# Patient Record
Sex: Female | Born: 1945 | Race: White | Hispanic: No | Marital: Married | Smoking: Never smoker
Health system: Southern US, Community
[De-identification: ages and names within clinical notes are randomized; demographics above are authoritative.]

## PROBLEM LIST (undated history)

## (undated) DIAGNOSIS — IMO0001 Reserved for inherently not codable concepts without codable children: Secondary | ICD-10-CM

## (undated) DIAGNOSIS — E039 Hypothyroidism, unspecified: Secondary | ICD-10-CM

## (undated) DIAGNOSIS — N952 Postmenopausal atrophic vaginitis: Secondary | ICD-10-CM

## (undated) DIAGNOSIS — C4491 Basal cell carcinoma of skin, unspecified: Secondary | ICD-10-CM

## (undated) DIAGNOSIS — T7840XA Allergy, unspecified, initial encounter: Secondary | ICD-10-CM

## (undated) DIAGNOSIS — C3490 Malignant neoplasm of unspecified part of unspecified bronchus or lung: Secondary | ICD-10-CM

## (undated) DIAGNOSIS — M8580 Other specified disorders of bone density and structure, unspecified site: Secondary | ICD-10-CM

## (undated) DIAGNOSIS — E785 Hyperlipidemia, unspecified: Secondary | ICD-10-CM

## (undated) DIAGNOSIS — H8109 Meniere's disease, unspecified ear: Secondary | ICD-10-CM

## (undated) DIAGNOSIS — Z5111 Encounter for antineoplastic chemotherapy: Secondary | ICD-10-CM

## (undated) DIAGNOSIS — C3411 Malignant neoplasm of upper lobe, right bronchus or lung: Secondary | ICD-10-CM

## (undated) DIAGNOSIS — R42 Dizziness and giddiness: Secondary | ICD-10-CM

## (undated) DIAGNOSIS — C801 Malignant (primary) neoplasm, unspecified: Secondary | ICD-10-CM

## (undated) HISTORY — DX: Allergy, unspecified, initial encounter: T78.40XA

## (undated) HISTORY — DX: Malignant neoplasm of unspecified part of unspecified bronchus or lung: C34.90

## (undated) HISTORY — DX: Malignant neoplasm of upper lobe, right bronchus or lung: C34.11

## (undated) HISTORY — DX: Other specified disorders of bone density and structure, unspecified site: M85.80

## (undated) HISTORY — DX: Hyperlipidemia, unspecified: E78.5

## (undated) HISTORY — DX: Encounter for antineoplastic chemotherapy: Z51.11

## (undated) HISTORY — DX: Dizziness and giddiness: R42

## (undated) HISTORY — DX: Hypothyroidism, unspecified: E03.9

## (undated) HISTORY — DX: Basal cell carcinoma of skin, unspecified: C44.91

## (undated) HISTORY — DX: Postmenopausal atrophic vaginitis: N95.2

## (undated) HISTORY — DX: Malignant (primary) neoplasm, unspecified: C80.1

---
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
## 1978-12-18 HISTORY — PX: VAGINAL HYSTERECTOMY: SUR661

## 1996-12-18 HISTORY — PX: BREAST LUMPECTOMY: SHX2

## 1997-12-18 DIAGNOSIS — C801 Malignant (primary) neoplasm, unspecified: Secondary | ICD-10-CM

## 1997-12-18 HISTORY — DX: Malignant (primary) neoplasm, unspecified: C80.1

## 1997-12-18 HISTORY — PX: BREAST BIOPSY: SHX20

## 2000-03-20 ENCOUNTER — Other Ambulatory Visit: Admission: RE | Admit: 2000-03-20 | Discharge: 2000-03-20 | Payer: Self-pay | Admitting: *Deleted

## 2000-09-05 ENCOUNTER — Emergency Department (HOSPITAL_COMMUNITY): Admission: EM | Admit: 2000-09-05 | Discharge: 2000-09-05 | Payer: Self-pay | Admitting: Emergency Medicine

## 2003-10-08 ENCOUNTER — Other Ambulatory Visit: Admission: RE | Admit: 2003-10-08 | Discharge: 2003-10-08 | Payer: Self-pay | Admitting: Gynecology

## 2005-09-21 ENCOUNTER — Other Ambulatory Visit: Admission: RE | Admit: 2005-09-21 | Discharge: 2005-09-21 | Payer: Self-pay | Admitting: Gynecology

## 2007-07-25 ENCOUNTER — Other Ambulatory Visit: Admission: RE | Admit: 2007-07-25 | Discharge: 2007-07-25 | Payer: Self-pay | Admitting: Gynecology

## 2007-10-01 ENCOUNTER — Emergency Department (HOSPITAL_COMMUNITY): Admission: EM | Admit: 2007-10-01 | Discharge: 2007-10-01 | Payer: Self-pay | Admitting: Emergency Medicine

## 2008-08-13 ENCOUNTER — Other Ambulatory Visit: Admission: RE | Admit: 2008-08-13 | Discharge: 2008-08-13 | Payer: Self-pay | Admitting: Gynecology

## 2008-12-18 DIAGNOSIS — C4491 Basal cell carcinoma of skin, unspecified: Secondary | ICD-10-CM

## 2008-12-18 HISTORY — DX: Basal cell carcinoma of skin, unspecified: C44.91

## 2009-09-06 ENCOUNTER — Ambulatory Visit: Payer: Self-pay | Admitting: Women's Health

## 2009-09-06 ENCOUNTER — Encounter: Payer: Self-pay | Admitting: Women's Health

## 2009-09-06 ENCOUNTER — Other Ambulatory Visit: Admission: RE | Admit: 2009-09-06 | Discharge: 2009-09-06 | Payer: Self-pay | Admitting: Gynecology

## 2010-08-18 LAB — HM MAMMOGRAPHY: HM Mammogram: NORMAL

## 2010-10-03 ENCOUNTER — Ambulatory Visit: Payer: Self-pay | Admitting: Women's Health

## 2011-04-06 ENCOUNTER — Ambulatory Visit (INDEPENDENT_AMBULATORY_CARE_PROVIDER_SITE_OTHER): Payer: Self-pay | Admitting: Women's Health

## 2011-04-06 ENCOUNTER — Other Ambulatory Visit (HOSPITAL_COMMUNITY)
Admission: RE | Admit: 2011-04-06 | Discharge: 2011-04-06 | Disposition: A | Discharge status: Home / Self Care | Payer: Self-pay | Source: Ambulatory Visit | Attending: Gynecology | Admitting: Gynecology

## 2011-04-06 ENCOUNTER — Other Ambulatory Visit: Payer: Self-pay | Admitting: Women's Health

## 2011-04-06 DIAGNOSIS — R8789 Other abnormal findings in specimens from female genital organs: Secondary | ICD-10-CM

## 2011-04-06 DIAGNOSIS — R87619 Unspecified abnormal cytological findings in specimens from cervix uteri: Secondary | ICD-10-CM | POA: Insufficient documentation

## 2011-04-18 LAB — HM PAP SMEAR: HM Pap smear: NORMAL

## 2011-08-04 ENCOUNTER — Ambulatory Visit (INDEPENDENT_AMBULATORY_CARE_PROVIDER_SITE_OTHER)
Admission: RE | Admit: 2011-08-04 | Discharge: 2011-08-04 | Disposition: A | Discharge status: Home / Self Care | Payer: Medicare Other | Source: Ambulatory Visit | Attending: Internal Medicine | Admitting: Internal Medicine

## 2011-08-04 ENCOUNTER — Other Ambulatory Visit (INDEPENDENT_AMBULATORY_CARE_PROVIDER_SITE_OTHER): Payer: Medicare Other

## 2011-08-04 ENCOUNTER — Encounter: Payer: Self-pay | Admitting: Internal Medicine

## 2011-08-04 ENCOUNTER — Other Ambulatory Visit: Payer: Self-pay | Admitting: Internal Medicine

## 2011-08-04 ENCOUNTER — Ambulatory Visit (INDEPENDENT_AMBULATORY_CARE_PROVIDER_SITE_OTHER): Payer: Medicare Other | Admitting: Internal Medicine

## 2011-08-04 VITALS — BP 104/72 | HR 83 | Temp 98.8°F | Ht 69.5 in | Wt 159.8 lb

## 2011-08-04 DIAGNOSIS — E785 Hyperlipidemia, unspecified: Secondary | ICD-10-CM

## 2011-08-04 DIAGNOSIS — M79606 Pain in leg, unspecified: Secondary | ICD-10-CM

## 2011-08-04 DIAGNOSIS — K5790 Diverticulosis of intestine, part unspecified, without perforation or abscess without bleeding: Secondary | ICD-10-CM | POA: Insufficient documentation

## 2011-08-04 DIAGNOSIS — M79609 Pain in unspecified limb: Secondary | ICD-10-CM

## 2011-08-04 DIAGNOSIS — Z0000 Encounter for general adult medical examination without abnormal findings: Secondary | ICD-10-CM

## 2011-08-04 DIAGNOSIS — E039 Hypothyroidism, unspecified: Secondary | ICD-10-CM | POA: Insufficient documentation

## 2011-08-04 DIAGNOSIS — J309 Allergic rhinitis, unspecified: Secondary | ICD-10-CM | POA: Insufficient documentation

## 2011-08-04 HISTORY — DX: Hypothyroidism, unspecified: E03.9

## 2011-08-04 HISTORY — DX: Hyperlipidemia, unspecified: E78.5

## 2011-08-04 LAB — CBC WITH DIFFERENTIAL/PLATELET
Basophils Absolute: 0.1 10*3/uL (ref 0.0–0.1)
Eosinophils Relative: 5.5 % — ABNORMAL HIGH (ref 0.0–5.0)
HCT: 38.8 % (ref 36.0–46.0)
Hemoglobin: 13.2 g/dL (ref 12.0–15.0)
Lymphocytes Relative: 37.4 % (ref 12.0–46.0)
Lymphs Abs: 2.9 10*3/uL (ref 0.7–4.0)
Monocytes Relative: 10.7 % (ref 3.0–12.0)
Neutro Abs: 3.5 10*3/uL (ref 1.4–7.7)
RBC: 4.32 Mil/uL (ref 3.87–5.11)
RDW: 12.3 % (ref 11.5–14.6)
WBC: 7.8 10*3/uL (ref 4.5–10.5)

## 2011-08-04 LAB — URINALYSIS, ROUTINE W REFLEX MICROSCOPIC
Leukocytes, UA: NEGATIVE
Specific Gravity, Urine: >=1.03 (ref 1.000–1.030)
Urine Glucose: NEGATIVE
Urobilinogen, UA: 0.2 (ref 0.0–1.0)
pH: 6 (ref 5.0–8.0)

## 2011-08-04 LAB — LIPID PANEL
Cholesterol: 196 mg/dL (ref 0–200)
HDL: 64.6 mg/dL (ref >39.00)
Triglycerides: 88 mg/dL (ref 0.0–149.0)
VLDL: 17.6 mg/dL (ref 0.0–40.0)

## 2011-08-04 LAB — HEPATIC FUNCTION PANEL
Albumin: 4.4 g/dL (ref 3.5–5.2)
Alkaline Phosphatase: 92 U/L (ref 39–117)
Total Protein: 6.6 g/dL (ref 6.0–8.3)

## 2011-08-04 LAB — BASIC METABOLIC PANEL
BUN: 21 mg/dL (ref 6–23)
CO2: 28 mEq/L (ref 19–32)
Calcium: 9.3 mg/dL (ref 8.4–10.5)
Creatinine, Ser: 0.8 mg/dL (ref 0.4–1.2)
GFR: 79.92 mL/min (ref >60.00)
Glucose, Bld: 90 mg/dL (ref 70–99)
Sodium: 142 mEq/L (ref 135–145)

## 2011-08-04 MED ORDER — PNEUMOCOCCAL VAC POLYVALENT 25 MCG/0.5ML IJ INJ: 0.5000 mL | INJECTION | Freq: Once | INTRAMUSCULAR | Status: DC

## 2011-08-04 NOTE — Patient Instructions (Addendum)
You had the pneumonia shot today You will be contacted regarding the referral for: colonoscopy Please remember to followup with your GYN for the yearly pap smear and/or mammogram, as you do Please go to LAB in the Basement for the blood and/or urine tests to be done today Please go to XRAY in the Basement for the x-ray test (for the right leg) Please call the phone number [phone] (the [employer] System) for results of testing in 2-3 days;  When calling, simply dial the number, and when prompted enter the MRN number above (the Medical Record Number) and the # key, then the message should start. Please return in 1 year for your yearly visit, or sooner if needed, with Lab testing done 3-5 days before

## 2011-08-04 NOTE — Assessment & Plan Note (Signed)
Overall doing well, age appropriate education and counseling updated, referrals for preventative services and immunizations addressed, dietary and smoking counseling addressed, most recent labs and ECG reviewed.  I have personally reviewed and have noted: 1) the patient's medical and social history 2) The pt's use of alcohol, tobacco, and illicit drugs 3) The patient's current medications and supplements 4) Functional ability including ADL's, fall risk, home safety risk, hearing and visual impairment 5) Diet and physical activities 6) Evidence for depression or mood disorder 7) The patient's height, weight, and BMI have been recorded in the chart I have made referrals, and provided counseling and education based on review of the above For colonoscopy, and pneumovax

## 2011-08-04 NOTE — Assessment & Plan Note (Addendum)
Recurrent persistent for one year after fall with marked knee strain, with knee pain/strain now improved but leg pain pretibial some worse in the past 2 mo;  For film today, and SPEP but suspect recurrent msk or neuritic pain, exam benign, ok to cont the alleve prn

## 2011-08-04 NOTE — Progress Notes (Signed)
Subjective:    Patient ID: Deborah [name], female    DOB: [birth year], 66 y.o.   MRN: [id number]  HPI Here as new pt; Here for wellness;  Overall doing ok;  Pt denies CP, worsening SOB, DOE, wheezing, orthopnea, PND, worsening LE edema, palpitations, dizziness or syncope.  Pt denies neurological change such as new Headache, facial or extremity weakness.  Pt denies polydipsia, polyuria, or low sugar symptoms. Pt states overall good compliance with treatment and medications, good tolerability, and trying to follow lower cholesterol diet.  Pt denies worsening depressive symptoms, suicidal ideation or panic. No fever, wt loss, night sweats, loss of appetite, or other constitutional symptoms.  Pt states good ability with ADL's, low fall risk, home safety reviewed and adequate, no significant changes in hearing or vision, and occasionally active with exercise.  Has had ongoing discomfort for approx 1 yr to the right mid pretibial area without skin change, swelling, or recent trauma except for an episode of fall where the right knee was strained just prior to onset of pain, but pain some worse in the past 2 mo.  Denies hyper or hypo thyroid symptoms such as voice, skin or hair change. Past Medical History  Diagnosis Date  . Hypothyroidism 08/04/2011  . Allergy   . Jaundice    Past Surgical History  Procedure Date  . Abdominal hysterectomy 1980  . Tonsillectomy 1952  . Breast biopsy 1999    reports that she has never smoked. She does not have any smokeless tobacco history on file. She reports that she drinks alcohol. She reports that she does not use illicit drugs. family history includes Cancer in her mother; Dementia in her mother; Diabetes in her other; and Heart disease in her father and other. Allergies not on file  Review of Systems Review of Systems  Constitutional: Negative for diaphoresis, activity change, appetite change and unexpected weight change.  HENT: Negative for hearing loss, ear  pain, facial swelling, mouth sores and neck stiffness.   Eyes: Negative for pain, redness and visual disturbance.  Respiratory: Negative for shortness of breath and wheezing.   Cardiovascular: Negative for chest pain and palpitations.  Gastrointestinal: Negative for diarrhea, blood in stool, abdominal distention and rectal pain.  Genitourinary: Negative for hematuria, flank pain and decreased urine volume.  Musculoskeletal: Negative for myalgias and joint swelling.  Skin: Negative for color change and wound.  Neurological: Negative for syncope and numbness.  Hematological: Negative for adenopathy.  Psychiatric/Behavioral: Negative for hallucinations, self-injury, decreased concentration and agitation.     Objective:   Physical Exam BP 104/72  Pulse 83  Temp(Src) 98.8 F (37.1 C) (Oral)  Ht 5' 9.5" (1.765 m)  Wt 159 lb 12.8 oz (72.485 kg)  BMI 23.26 kg/m2  SpO2 95% Physical Exam  VS noted Constitutional: Pt is oriented to person, place, and time. Appears well-developed and well-nourished.  HENT:  Head: Normocephalic and atraumatic.  Right Ear: External ear normal.  Left Ear: External ear normal.  Nose: Nose normal.  Mouth/Throat: Oropharynx is clear and moist.  Eyes: Conjunctivae and EOM are normal. Pupils are equal, round, and reactive to light.  Neck: Normal range of motion. Neck supple. No JVD present. No tracheal deviation present.  Cardiovascular: Normal rate, regular rhythm, normal heart sounds and intact distal pulses.   Pulmonary/Chest: Effort normal and breath sounds normal.  Abdominal: Soft. Bowel sounds are normal. There is no tenderness.  Musculoskeletal: Normal range of motion. Exhibits no edema.  Lymphadenopathy:  Has no cervical adenopathy.  Neurological: Pt is alert and oriented to person, place, and time. Pt has normal reflexes. No cranial nerve deficit.  Skin: Skin is warm and dry. No rash noted.  Psychiatric:  Has  normal mood and affect. Behavior is normal.    RLE nontender, nonswollen, neurovasc intact below the knee       Assessment & Plan:

## 2011-08-08 LAB — PROTEIN ELECTROPHORESIS, SERUM
Alpha-1-Globulin: 4.3 % (ref 2.9–4.9)
Gamma Globulin: 9.7 % — ABNORMAL LOW (ref 11.1–18.8)
Total Protein, Serum Electrophoresis: 6.5 g/dL (ref 6.0–8.3)

## 2011-08-10 ENCOUNTER — Other Ambulatory Visit: Payer: Self-pay | Admitting: Internal Medicine

## 2011-08-10 ENCOUNTER — Ambulatory Visit
Admission: RE | Admit: 2011-08-10 | Discharge: 2011-08-10 | Disposition: A | Discharge status: Home / Self Care | Payer: Medicare Other | Source: Ambulatory Visit | Attending: Internal Medicine | Admitting: Internal Medicine

## 2011-08-10 ENCOUNTER — Encounter: Payer: Self-pay | Admitting: Internal Medicine

## 2011-08-10 DIAGNOSIS — M79606 Pain in leg, unspecified: Secondary | ICD-10-CM

## 2011-08-15 ENCOUNTER — Telehealth: Payer: Self-pay

## 2011-08-15 NOTE — Telephone Encounter (Signed)
Pt advised.

## 2011-08-15 NOTE — Telephone Encounter (Signed)
Pt called requesting results of MRI 

## 2011-08-15 NOTE — Telephone Encounter (Signed)
Should be on [place] as I remember doing the message the same date it was done;  MRI shows benign appearing lesions only to the bone;  No need for further eval or tx

## 2011-09-06 ENCOUNTER — Ambulatory Visit (AMBULATORY_SURGERY_CENTER): Payer: Medicare Other | Admitting: *Deleted

## 2011-09-06 ENCOUNTER — Encounter: Payer: Self-pay | Admitting: Internal Medicine

## 2011-09-06 VITALS — Ht 69.0 in | Wt 160.0 lb

## 2011-09-06 DIAGNOSIS — Z1211 Encounter for screening for malignant neoplasm of colon: Secondary | ICD-10-CM

## 2011-09-06 MED ORDER — PEG-KCL-NACL-NASULF-NA ASC-C 100 G PO SOLR: ORAL | Status: DC

## 2011-09-20 ENCOUNTER — Ambulatory Visit (AMBULATORY_SURGERY_CENTER): Payer: Medicare Other | Admitting: Internal Medicine

## 2011-09-20 ENCOUNTER — Encounter: Payer: Self-pay | Admitting: Internal Medicine

## 2011-09-20 VITALS — BP 132/85 | HR 80 | Temp 97.6°F | Resp 16 | Ht 69.0 in | Wt 160.0 lb

## 2011-09-20 DIAGNOSIS — D129 Benign neoplasm of anus and anal canal: Secondary | ICD-10-CM

## 2011-09-20 DIAGNOSIS — Z1211 Encounter for screening for malignant neoplasm of colon: Secondary | ICD-10-CM

## 2011-09-20 DIAGNOSIS — D128 Benign neoplasm of rectum: Secondary | ICD-10-CM

## 2011-09-20 DIAGNOSIS — D126 Benign neoplasm of colon, unspecified: Secondary | ICD-10-CM

## 2011-09-20 DIAGNOSIS — K635 Polyp of colon: Secondary | ICD-10-CM

## 2011-09-20 MED ORDER — SODIUM CHLORIDE 0.9 % IV SOLN: 500.0000 mL | INTRAVENOUS | Status: DC

## 2011-09-20 NOTE — Patient Instructions (Signed)
Please refer to your blue and neon green sheets for instructions regarding diet and activity for the rest of today.  You may resume your medications as you would normally take them.  You will receive a letter in the mail in about two weeks regarding the results of the biopsies taken today.  Polyps, Colon  A polyp is extra tissue that grows inside your body. Colon polyps grow in the large intestine. The large intestine, also called the colon, is part of your digestive system. It is a long, hollow tube at the end of your digestive tract where your body makes and stores stool. Most polyps are not dangerous. They are benign. This means they are not cancerous. But over time, some types of polyps can turn into cancer. Polyps that are smaller than a pea are usually not harmful. But larger polyps could someday become or may already be cancerous. To be safe, doctors remove all polyps and test them.  WHO GETS POLYPS? Anyone can get polyps, but certain people are more likely than others. You may have a greater chance of getting polyps if:  You are over 50.   You have had polyps before.   Someone in your family has had polyps.   Someone in your family has had cancer of the large intestine.   Find out if someone in your family has had polyps. You may also be more likely to get polyps if you:   Eat a lot of fatty foods   Smoke   Drink alcohol   Do not exercise  Eat too much  SYMPTOMS Most small polyps do not cause symptoms. People often do not know they have one until their caregiver finds it during a regular checkup or while testing them for something else. Some people do have symptoms like these:  Bleeding from the anus. You might notice blood on your underwear or on toilet paper after you have had a bowel movement.   Constipation or diarrhea that lasts more than a week.   Blood in the stool. Blood can make stool look black or it can show up as red streaks in the stool.  If you have any of  these symptoms, see your caregiver. HOW DOES THE DOCTOR TEST FOR POLYPS? The doctor can use four tests to check for polyps:  Digital rectal exam. The caregiver wears gloves and checks your rectum (the last part of the large intestine) to see if it feels normal. This test would find polyps only in the rectum. Your caregiver may need to do one of the other tests listed below to find polyps higher up in the intestine.   Barium enema. The caregiver puts a liquid called barium into your rectum before taking x-rays of your large intestine. Barium makes your intestine look white in the pictures. Polyps are dark, so they are easy to see.   Sigmoidoscopy. With this test, the caregiver can see inside your large intestine. A thin flexible tube is placed into your rectum. The device is called a sigmoidoscope, which has a light and a tiny video camera in it. The caregiver uses the sigmoidoscope to look at the last third of your large intestine.   Colonoscopy. This test is like sigmoidoscopy, but the caregiver looks at all of the large intestine. It usually requires sedation. This is the most common method for finding and removing polyps.  TREATMENT  The caregiver will remove the polyp during sigmoidoscopy or colonoscopy. The polyp is then tested for cancer.     If you have had polyps, your caregiver may want you to get tested regularly in the future.  PREVENTION There is not one sure way to prevent polyps. You might be able to lower your risk of getting them if you:  Eat more fruits and vegetables and less fatty food.   Do not smoke.   Avoid alcohol.   Exercise every day.   Lose weight if you are overweight.   Eating more calcium and folate can also lower your risk of getting polyps. Some foods that are rich in calcium are milk, cheese, and broccoli. Some foods that are rich in folate are chickpeas, kidney beans, and spinach.   Aspirin might help prevent polyps. Studies are under way.  Document  Released: 08/30/2004 Document Re-Released: 05/24/2010 Exit[employer] Patient Information 2011 [employer].  Diverticulosis Diverticulosis is a common condition that develops when small pouches (diverticula) form in the wall of the colon. The risk of diverticulosis increases with age. It happens more often in people who eat a low-fiber diet. Most individuals with diverticulosis have no symptoms. Those individuals with symptoms usually experience belly (abdominal) pain, constipation, or loose stools (diarrhea). HOME CARE INSTRUCTIONS  Increase the amount of fiber in your diet as directed by your caregiver or dietician. This may reduce symptoms of diverticulosis.   Your caregiver may recommend taking a dietary fiber supplement.   Drink at least 6 to 8 glasses of water each day to prevent constipation.   Try not to strain when you have a bowel movement.   Your caregiver may recommend avoiding nuts and seeds to prevent complications, although this is still an uncertain benefit.   Only take over-the-counter or prescription medicines for pain, discomfort, or fever as directed by your caregiver.  FOODS HAVING HIGH FIBER CONTENT INCLUDE:  Fruits. Apple, peach, pear, tangerine, raisins, prunes.   Vegetables. Brussels sprouts, asparagus, broccoli, cabbage, carrot, cauliflower, romaine lettuce, spinach, summer squash, tomato, winter squash, zucchini.   Starchy Vegetables. Baked beans, kidney beans, lima beans, split peas, lentils, potatoes (with skin).   Grains. Whole wheat bread, brown rice, bran flake cereal, plain oatmeal, white rice, shredded wheat, bran muffins.  SEEK IMMEDIATE MEDICAL CARE IF:  You develop increasing pain or severe bloating.   You have an increased oral temperature, not controlled by medicine.   You develop vomiting or bowel movements that are bloody or black.  Document Released: 08/31/2004 Document Re-Released: 05/24/2010 [employer] Patient Information 2011 [employer]. 

## 2011-09-21 ENCOUNTER — Telehealth: Payer: Self-pay | Admitting: *Deleted

## 2011-09-21 NOTE — Telephone Encounter (Signed)

## 2011-09-25 ENCOUNTER — Encounter: Payer: Self-pay | Admitting: Internal Medicine

## 2011-10-04 ENCOUNTER — Encounter: Payer: Self-pay | Admitting: Women's Health

## 2011-10-06 ENCOUNTER — Encounter: Payer: Self-pay | Admitting: Internal Medicine

## 2011-10-06 DIAGNOSIS — C801 Malignant (primary) neoplasm, unspecified: Secondary | ICD-10-CM | POA: Insufficient documentation

## 2011-10-06 DIAGNOSIS — N952 Postmenopausal atrophic vaginitis: Secondary | ICD-10-CM | POA: Insufficient documentation

## 2011-10-09 ENCOUNTER — Encounter: Payer: Medicare Other | Admitting: Women's Health

## 2011-10-11 ENCOUNTER — Other Ambulatory Visit (HOSPITAL_COMMUNITY)
Admission: RE | Admit: 2011-10-11 | Discharge: 2011-10-11 | Disposition: A | Discharge status: Home / Self Care | Payer: Medicare Other | Source: Ambulatory Visit | Attending: Women's Health | Admitting: Women's Health

## 2011-10-11 ENCOUNTER — Ambulatory Visit (INDEPENDENT_AMBULATORY_CARE_PROVIDER_SITE_OTHER): Payer: Medicare Other | Admitting: Women's Health

## 2011-10-11 ENCOUNTER — Encounter: Payer: Self-pay | Admitting: Women's Health

## 2011-10-11 VITALS — BP 130/80 | Ht 69.25 in | Wt 161.0 lb

## 2011-10-11 DIAGNOSIS — Z780 Asymptomatic menopausal state: Secondary | ICD-10-CM

## 2011-10-11 DIAGNOSIS — Z01419 Encounter for gynecological examination (general) (routine) without abnormal findings: Secondary | ICD-10-CM

## 2011-10-11 DIAGNOSIS — E039 Hypothyroidism, unspecified: Secondary | ICD-10-CM

## 2011-10-11 DIAGNOSIS — E079 Disorder of thyroid, unspecified: Secondary | ICD-10-CM

## 2011-10-11 MED ORDER — LEVOTHYROXINE SODIUM 75 MCG PO TABS: 75.0000 ug | ORAL_TABLET | Freq: Every day | ORAL | Status: DC

## 2011-10-11 NOTE — Progress Notes (Signed)
Deborah [name] 1946-11-15 [id number]    History:    The patient presents for pap and vaginal dryness.   Past medical history, past surgical history, family history and social history were all reviewed and documented in the EPIC chart.   ROS:  A  ROS was performed and pertinent positives and negatives are included in the history.  Exam:  Filed Vitals:   10/11/11 0959  BP: 130/80    General appearance:  Normal Head/Neck:  Normal, without cervical or supraclavicular adenopathy. Thyroid:  Symmetrical, normal in size, without palpable masses or nodularity. Respiratory  Effort:  Normal  Auscultation:  Clear without wheezing or rhonchi Cardiovascular  Auscultation:  Regular rate, without rubs, murmurs or gallops  Edema/varicosities:  Not grossly evident Abdominal  Soft,nontender, without masses, guarding or rebound.  Liver/spleen:  No organomegaly noted  Hernia:  None appreciated  Skin  Inspection:  Grossly normal  Palpation:  Grossly normal Neurologic/psychiatric  Orientation:  Normal with appropriate conversation.  Mood/affect:  Normal  Genitourinary    Breasts: Examined lying and sitting.     Right: Without masses, retractions, discharge or axillary adenopathy.     Left: Without masses, retractions, discharge or axillary adenopathy.   Inguinal/mons:  Normal without inguinal adenopathy  External genitalia:  Normal  BUS/Urethra/Skene's glands:  Normal  Bladder:  Normal  Vagina:  Normal/atrophic  Cervix: absent   Uterus:    Adnexa/parametria:     Rt: Without masses or tenderness.   Lt: Without masses or tenderness.  Anus and perineum: Normal  Digital rectal exam: Normal sphincter tone without palpated masses or tenderness  Assessment/Plan:  65 y.o. MWF G3P3 for pap and breast  exam.  Had a colonoscopy last month on polyp that was negative. Significant history of right breast cancer in 1999 with lumpectomy and radiation, normal mammograms after. Has had the  Pneumovax vaccine, encouraged to have  zostovac vaccine at primary care. Hypothyroid on 75 mcg Synthroid. History of a TVH for menorrhagia in 82.  Postmenopausal/ no ERT/ history of breast cancer in 99 Vaginal dryness Hypothyroid  Plan: TSH and Pap, Synthroid 75 mcg by mouth daily prescription proper use reviewed. States has her other labs at her primary care and will start having her TSH done they're as well. SBEs encouraged continue annual mammogram screening. Vitamin D 2000 daily encouraged, DEXA will schedule here has never had one. Encouraged vaginal lubricants with intercourse. Encourage daily exercise, is helping to raise her 59 year old twin grandchildren.    Harrington Challenger [facility], 12:52 PM 10/11/2011

## 2011-10-11 NOTE — Patient Instructions (Signed)
zostavac vaccine/shingles vaccine

## 2011-12-14 ENCOUNTER — Telehealth: Payer: Self-pay

## 2011-12-14 NOTE — Telephone Encounter (Signed)
Patient called today concerning a persistent cough and requested rx sent in. Informed the patient that JWJ has not seen her since August and would need to schedule OV. She agreed to do so.

## 2011-12-15 ENCOUNTER — Encounter: Payer: Self-pay | Admitting: Internal Medicine

## 2011-12-15 ENCOUNTER — Ambulatory Visit (INDEPENDENT_AMBULATORY_CARE_PROVIDER_SITE_OTHER): Payer: Medicare Other | Admitting: Internal Medicine

## 2011-12-15 VITALS — BP 118/76 | HR 87 | Temp 97.9°F | Ht 69.5 in | Wt 163.2 lb

## 2011-12-15 DIAGNOSIS — E785 Hyperlipidemia, unspecified: Secondary | ICD-10-CM

## 2011-12-15 DIAGNOSIS — J309 Allergic rhinitis, unspecified: Secondary | ICD-10-CM

## 2011-12-15 DIAGNOSIS — J209 Acute bronchitis, unspecified: Secondary | ICD-10-CM | POA: Insufficient documentation

## 2011-12-15 MED ORDER — HYDROCODONE-HOMATROPINE 5-1.5 MG/5ML PO SYRP: 5.0000 mL | ORAL_SOLUTION | Freq: Four times a day (QID) | ORAL | Status: AC | PRN

## 2011-12-15 MED ORDER — AZITHROMYCIN 250 MG PO TABS: ORAL_TABLET | ORAL | Status: AC

## 2011-12-15 NOTE — Patient Instructions (Signed)
Take all new medications as prescribed - the antibiotic (sent to the pharmacy), and the cough medicine (given hardcopy) Continue all other medications as before

## 2011-12-17 ENCOUNTER — Encounter: Payer: Self-pay | Admitting: Internal Medicine

## 2011-12-17 NOTE — Assessment & Plan Note (Signed)
stable overall by hx and exam,  and pt to continue medical treatment as before, for allegra otc prn

## 2011-12-17 NOTE — Progress Notes (Signed)
  Subjective:    Patient ID: Deborah [name], female    DOB: 05/19/1946, 65 y.o.   MRN: [id number]  HPI  Here with acute onset mild to mod 2-3 days ST, HA, general weakness and malaise, with prod cough greenish sputum, but Pt denies chest pain, increased sob or doe, wheezing, orthopnea, PND, increased LE swelling, palpitations, dizziness or syncope. Pt denies new neurological symptoms such as new headache, or facial or extremity weakness or numbness   Pt denies polydipsia, polyuria  Pt states overall good compliance with meds, trying to follow lower cholesterol diet, wt overall stable.  Does have several wks ongoing nasal allergy symptoms with clear congestion, itch and sneeze, without fever, pain, ST, cough or wheezing.  Grandson with pneuomonia Past Medical History  Diagnosis Date  . Allergy   . Jaundice   . Hyperlipidemia 08/04/2011  . Hypothyroidism 08/04/2011  . Atrophic vaginitis   . 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 Basal cell carcinoma of skin 2010   Past Surgical History  Procedure Date  . Tonsillectomy 1952  . Vaginal hysterectomy 1980  . Breast biopsy 1999    RADIATION TREATMENTS    reports that she has never smoked. She has never used smokeless tobacco. She reports that she drinks about .6 ounces of alcohol per week. She reports that she does not use illicit drugs. family history includes Cancer in her mother; Dementia in her mother; Diabetes in her daughter; and Heart disease in her father and other. No Known Allergies Current Outpatient Prescriptions on File Prior to Visit  Medication Sig Dispense Refill  . CALCIUM PO Take by mouth.        . levothyroxine (SYNTHROID, LEVOTHROID) 75 MCG tablet Take 1 tablet (75 mcg total) by mouth daily.  30 tablet  12  . peg 3350 powder (MOVIPREP) 100 G SOLR MOVI PREP take as directed  1 kit  0   Current Facility-Administered Medications on File Prior to Visit  Medication Dose Route  Frequency Provider Last Rate Last Dose  . pneumococcal 23 valent vaccine (PNU-IMMUNE) injection 0.5 mL  0.5 mL Intramuscular Once Oliver Barre, MD       Review of Systems Review of Systems  Constitutional: Negative for diaphoresis and unexpected weight change.  HENT: Negative for drooling and tinnitus.   Eyes: Negative for photophobia and visual disturbance.  Respiratory: Negative for choking and stridor.   Gastrointestinal: Negative for vomiting and blood in stool.  Genitourinary: Negative for hematuria and decreased urine volume.     Objective:   Physical Exam BP 118/76  Pulse 87  Temp(Src) 97.9 F (36.6 C) (Oral)  Ht 5' 9.5" (1.765 m)  Wt 163 lb 3.2 oz (74.027 kg)  BMI 23.75 kg/m2  SpO2 95%  LMP 10/11/1979 Physical Exam  VS noted Constitutional: Pt appears well-developed and well-nourished.  HENT: Head: Normocephalic.  Right Ear: External ear normal.  Left Ear: External ear normal.  Bilat tm's mild erythema.  Sinus nontender.  Pharynx mild erythema Eyes: Conjunctivae and EOM are normal. Pupils are equal, round, and reactive to light.  Neck: Normal range of motion. Neck supple.  Cardiovascular: Normal rate and regular rhythm.   Pulmonary/Chest: Effort normal and breath sounds normal.  Neurological: Pt is alert. No cranial nerve deficit.  Skin: Skin is warm. No erythema.  Psychiatric: Pt behavior is normal. Thought content normal.     Assessment & Plan:

## 2011-12-17 NOTE — Assessment & Plan Note (Signed)
Mild to mod, for antibx course,  to f/u any worsening symptoms or concerns 

## 2011-12-17 NOTE — Assessment & Plan Note (Signed)
stable overall by hx and exam, most recent data reviewed with pt, and pt to continue medical treatment as before  Lab Results  Component Value Date   LDLCALC 114* 08/04/2011   For low chol diet, declines statin

## 2012-10-08 ENCOUNTER — Encounter: Payer: Self-pay | Admitting: Women's Health

## 2012-10-11 ENCOUNTER — Encounter: Payer: Self-pay | Admitting: Women's Health

## 2012-10-11 ENCOUNTER — Ambulatory Visit (INDEPENDENT_AMBULATORY_CARE_PROVIDER_SITE_OTHER): Payer: Medicare Other | Admitting: Women's Health

## 2012-10-11 VITALS — BP 132/88 | Ht 69.0 in | Wt 160.0 lb

## 2012-10-11 DIAGNOSIS — Z780 Asymptomatic menopausal state: Secondary | ICD-10-CM

## 2012-10-11 DIAGNOSIS — E039 Hypothyroidism, unspecified: Secondary | ICD-10-CM

## 2012-10-11 MED ORDER — LEVOTHYROXINE SODIUM 75 MCG PO TABS: 75.0000 ug | ORAL_TABLET | Freq: Every day | ORAL | Status: DC

## 2012-10-11 NOTE — Patient Instructions (Signed)
Vit d 2000 daily  Health Recommendations for Postmenopausal Women Based on the Results of the Women's Health Initiative (WHI) and Other Studies The WHI is a major 15-year research program to address the most common causes of death, disability and poor quality of life in postmenopausal women. Some of these causes are heart disease, cancer, bone loss (osteoporosis) and others. Taking into account all of the findings from WHI and other studies, here are bottom-line health recommendations for women: CARDIOVASCULAR DISEASE Heart Disease: A heart attack is a medical emergency. Know the signs and symptoms of a heart attack. Hormone therapy should not be used to prevent heart disease. In women with heart disease, hormone therapy should not be used to prevent further disease. Hormone therapy increases the risk of blood clots. Below are things women can do to reduce their risk for heart disease.   Do not smoke. If you smoke, quit. Women who smoke are 2 to 6 times more likely to suffer a heart attack than non-smoking women.  Aim for a healthy weight. Being overweight causes many preventable deaths. Eat a healthy and balanced diet and drink an adequate amount of liquids.  Get moving. Make a commitment to be more physically active. Aim for 30 minutes of activity on most, if not all days of the week.  Eat for heart health. Choose a diet that is low in saturated fat, trans fat, and cholesterol. Include whole grains, vegetables, and fruits. Read the labels on the food container before buying it.  Know your numbers. Ask your caregiver to check your blood pressure, cholesterol (total, HDL, LDL, triglycerides) and blood glucose. Work with your caregiver to improve any numbers that are not normal.  High blood pressure. Limit or stop your table salt intake (try salt substitute and food seasonings), avoid salty foods and drinks. Read the labels on the food container before buying it. Avoid becoming overweight by eating  well and exercising. STROKE  Stroke is a medical emergency. Stroke can be the result of a blood clot in the blood vessel in the brain or by a brain hemorrhage (bleeding). Know the signs and symptoms of a stroke. To lower the risk of developing a stroke:  Avoid fatty foods.  Quit smoking.  Control your diabetes, blood pressure, and irregular heart rate. THROMBOPHLIBITIS (BLOOD CLOT) OF THE LEG  Hormone treatment is a big cause of developing blood clots in the leg. Becoming overweight and leading a stationary lifestyle also may contribute to developing blood clots. Controlling your diet and exercising will help lower the risk of developing blood clots. CANCER SCREENING  Breast Cancer: Women should take steps to reduce their risk of breast cancer. This includes having regular mammograms, monthly self breast exams and regular breast exams by your caregiver. Have a mammogram every one to two years if you are 40 to 66 years old. Have a mammogram annually if you are 50 years old or older depending on your risk factors. Women who are high risk for breast cancer may need more frequent mammograms. There are tests available (testing the genes in your body) if you have family history of breast cancer called BRCA 1 and 2. These tests can help determine the risks of developing breast cancer.  Intestinal or Stomach Cancer: Women should talk to their caregiver about when to start screening, what tests and how often they should be done, and the benefits and risks of doing these tests. Tests to consider are a rectal exam, fecal occult blood, sigmoidoscopy, colononoscoby, barium   enema and upper GI series of the stomach. Depending on the age, you may want to get a medical and family history of colon cancer. Women who are high risk may need to be screened at an earlier age and more often.  Cervical Cancer: A Pap test of the cervix should be done every year and every 3 years when there has been three straight years of a  normal Pap test. Women with an abnormal Pap test should be screened more often or have a cervical biopsy depending on your caregiver's recommendation.  Uterine Cancer: If you have vaginal bleeding after you are in the menopause, it should be evaluated by your caregiver.  Ovarian cancer: There are no reliable tests available to screen for ovarian cancer at this time except for yearly pelvic exams.  Lung Cancer: Yearly chest X-rays can detect lung cancer and should be done on high risk women, such as cigarette smokers and women with chronic lung disease (emphysemia).  Skin Cancer: A complete body skin exam should be done at your yearly examination. Avoid overexposure to the sun and ultraviolet light lamps. Use a strong sun block cream when in the sun. All of these things are important in lowering the risk of skin cancer. MENOPAUSE Menopause Symptoms: Hormone therapy products are effective for treating symptoms associated with menopause:  Moderate to severe hot flashes.  Night sweats.  Mood swings.  Headaches.  Tiredness.  Loss of sex drive.  Insomnia.  Other symptoms. However, hormone therapy products carry serious risks, especially in older women. Women who use or are thinking about using estrogen or estrogen with progestin treatments should discuss that with their caregiver. Your caregiver will know if the benefits outweigh the risks. The Food and Drug Administration (FDA) has concluded that hormone therapy should be used only at the lowest doses and for the shortest amount of time to reach treatment goals. It is not known at what doses there may be less risk of serious side effects. There are other treatments such as herbal medication (not controlled or regulated by the FDA), group therapy, counseling and acupuncture that may be helpful. OSTEOPOROSIS Protecting Against Bone Loss and Preventing Fracture: If hormone therapy is used for prevention of bone loss (osteoporosis), the risks for  bone loss must outweigh the risk of the therapy. Women considering taking hormone therapy for bone loss should ask their health care providers about other medications (fosamax and boniva) that are considered safe and effective for preventing bone loss and bone fractures. To guard against bone loss or fractures, it is recommended that women should take at least 1000-1500 mg of calcium and 400-800 IU of vitamin D daily in divided doses. Smoking and excessive alcohol intake increases the risk of osteoporosis. Eat foods rich in calcium and vitamin D and do weight bearing exercises several times a week as your caregiver suggests. DIABETES Diabetes Melitus: Women with Type I or Type 2 diabetes should keep their diabetes in control with diet, exercise and medication. Avoid too many sweets, starchy and fatty foods. Being overweight can affect your diabetes. COGNITION AND MEMORY Cognition and Memory: Menopausal hormone therapy is not recommended for the prevention of cognitive disorders such as Alzheimer's disease or memory loss. WHI found that women treated with hormone therapy have a greater risk of developing dementia.  DEPRESSION  Depression may occur at any age, but is common in elderly women. The reasons may be because of physical, medical, social (loneliness), financial and/or economic problems and needs. Becoming involved with   church, volunteer or social groups, seeking treatment for any physical or medical problems is recommended. Also, look into getting professional advice for any economic or financial problems. ACCIDENTS  Accidents are common and can be serious in the elderly woman. Prepare your house to prevent accidents. Eliminate throw rugs, use hip protectors, place hand bars in the bath, shower and toilet areas. Avoid wearing high heel shoes and walking on wet, snowy and icy areas. Stop driving if you have vision, hearing problems or are unsteady with you movements and reflexes. RHEUMATOID  ARTHRITIS Rheumatoid arthritis causes pain, swelling and stiffness of your bone joints. It can limit many of your activities. Over-the-counter medications may help, but prescription medications may be necessary. Talk with your caregiver about this. Exercise (walking, water aerobics), good posture, using splints on painful joints, warm baths or applying warm compresses to stiff joints and cold compresses to painful joints may be helpful. Smoking and excessive drinking may worsen the symptoms of arthritis. Seek help from a physical therapist if the arthritis is becoming a problem with your daily activities. IMMUNIZATIONS  Several immunizations are important to have during your senior years, including:   Tetanus and a diptheria shot booster every 10 years.  Influenza every year before the flu season begins.  Pneumonia vaccine.  Shingles vaccine.  Others as indicated (example: H1N1 vaccine). Document Released: 01/26/2006 Document Revised: 02/26/2012 Document Reviewed: 09/21/2008 [employer] Patient Information 2013 [employer].  

## 2012-10-11 NOTE — Progress Notes (Signed)
Deborah [name] [birth year] [id number]    History:    The patient presents for breast and pelvic exam. TVH/ fibroids in 1982. Right Breast cancer 1999 , radiation and lumpectomy. Normal mammograms after. Declined BRCA testing. History of basal skin cancer 2010, has skin check every 6 months. Mother history of melanoma and breast cancer (lived late 83's). Hypothyroid on 75 mcg daily. History of normal Paps. Benign colon polyp 2012. Has not had a DEXA.   Past medical history, past surgical history, family history and social history were all reviewed and documented in the EPIC chart. Daughter Irving Burton with 1 yo  twins live with her. Onalee Hua attorney, Arline Asp other daughter doing well.   ROS:  A  ROS was performed and pertinent positives and negatives are included in the history.  Exam:  Filed Vitals:   10/11/12 0917  BP: 132/88    General appearance:  Normal Head/Neck:  Normal, without cervical or supraclavicular adenopathy. Thyroid:  Symmetrical, normal in size, without palpable masses or nodularity. Respiratory  Effort:  Normal  Auscultation:  Clear without wheezing or rhonchi Cardiovascular  Auscultation:  Regular rate, without rubs, murmurs or gallops  Edema/varicosities:  Not grossly evident Abdominal  Soft,nontender, without masses, guarding or rebound.  Liver/spleen:  No organomegaly noted  Hernia:  None appreciated  Skin  Inspection:  Grossly normal  Palpation:  Grossly normal Neurologic/psychiatric  Orientation:  Normal with appropriate conversation.  Mood/affect:  Normal  Genitourinary    Breasts: Examined lying and sitting.     Right: Without masses, retractions, discharge or axillary adenopathy.     Left: Without masses, retractions, discharge or axillary adenopathy.   Inguinal/mons:  Normal without inguinal adenopathy  External genitalia:  Normal  BUS/Urethra/Skene's glands:  Normal  Bladder:  Normal  Vagina:  Normal  Cervix:  Absent  Uterus:   Absent  Adnexa/parametria:     Rt: Without masses or tenderness.   Lt: Without masses or tenderness.  Anus and perineum: Normal  Digital rectal exam: Normal sphincter tone without palpated masses or tenderness  Assessment/Plan:  66 y.o. M. WF G4 P3  for breast and pelvic exam with no complaints.   Hypothyroid-75 mcg daily  Blood pressure slightly elevated at 138/88 will check away from office in followup as needed if greater than 130/80. Breast cancer history 1999/ normal mammograms after  Plan: SBE's, continue annual mammogram, 3D mammography reviewed and encouraged. Declined BRCA testing. Schedule bone density here. Reviewed importance of vitamin D 2000 daily, calcium rich diet, regular exercise for bone health and general health. Synthroid 75 mcg Rx given with instructions to followup with Dr. Melvyn Novas for TSH surveillance and prescription. Home Hemoccult card given. Normal Pap 2012, new screening guidelines reviewed.   Harrington Challenger [facility], 10:38 AM 10/11/2012

## 2012-10-16 ENCOUNTER — Encounter: Payer: Self-pay | Admitting: Women's Health

## 2012-10-18 DIAGNOSIS — M8580 Other specified disorders of bone density and structure, unspecified site: Secondary | ICD-10-CM

## 2012-10-18 HISTORY — DX: Other specified disorders of bone density and structure, unspecified site: M85.80

## 2012-11-12 ENCOUNTER — Ambulatory Visit (INDEPENDENT_AMBULATORY_CARE_PROVIDER_SITE_OTHER): Payer: Medicare Other

## 2012-11-12 ENCOUNTER — Other Ambulatory Visit: Payer: Self-pay | Admitting: Gynecology

## 2012-11-12 DIAGNOSIS — M8580 Other specified disorders of bone density and structure, unspecified site: Secondary | ICD-10-CM

## 2012-11-12 DIAGNOSIS — M949 Disorder of cartilage, unspecified: Secondary | ICD-10-CM

## 2012-11-12 DIAGNOSIS — Z780 Asymptomatic menopausal state: Secondary | ICD-10-CM

## 2012-11-18 ENCOUNTER — Encounter: Payer: Self-pay | Admitting: Gynecology

## 2013-04-30 ENCOUNTER — Encounter: Payer: Self-pay | Admitting: Internal Medicine

## 2013-04-30 ENCOUNTER — Ambulatory Visit (INDEPENDENT_AMBULATORY_CARE_PROVIDER_SITE_OTHER): Payer: Medicare Other | Admitting: Internal Medicine

## 2013-04-30 ENCOUNTER — Other Ambulatory Visit (INDEPENDENT_AMBULATORY_CARE_PROVIDER_SITE_OTHER): Payer: Medicare Other

## 2013-04-30 VITALS — BP 120/80 | HR 87 | Temp 97.1°F | Ht 69.5 in | Wt 161.2 lb

## 2013-04-30 DIAGNOSIS — E039 Hypothyroidism, unspecified: Secondary | ICD-10-CM

## 2013-04-30 DIAGNOSIS — E785 Hyperlipidemia, unspecified: Secondary | ICD-10-CM

## 2013-04-30 DIAGNOSIS — J309 Allergic rhinitis, unspecified: Secondary | ICD-10-CM

## 2013-04-30 DIAGNOSIS — Z0000 Encounter for general adult medical examination without abnormal findings: Secondary | ICD-10-CM

## 2013-04-30 LAB — URINALYSIS, ROUTINE W REFLEX MICROSCOPIC
Leukocytes, UA: NEGATIVE
Nitrite: NEGATIVE
Specific Gravity, Urine: >=1.03 (ref 1.000–1.030)
Total Protein, Urine: NEGATIVE
pH: 5.5 (ref 5.0–8.0)

## 2013-04-30 LAB — BASIC METABOLIC PANEL
BUN: 15 mg/dL (ref 6–23)
Creatinine, Ser: 0.8 mg/dL (ref 0.4–1.2)
GFR: 79.49 mL/min (ref >60.00)
Glucose, Bld: 85 mg/dL (ref 70–99)

## 2013-04-30 LAB — CBC WITH DIFFERENTIAL/PLATELET
Basophils Relative: 0.9 % (ref 0.0–3.0)
Eosinophils Relative: 4.7 % (ref 0.0–5.0)
HCT: 39.7 % (ref 36.0–46.0)
MCV: 87.4 fl (ref 78.0–100.0)
Monocytes Absolute: 0.8 10*3/uL (ref 0.1–1.0)
Monocytes Relative: 9.1 % (ref 3.0–12.0)
Neutrophils Relative %: 37.3 % — ABNORMAL LOW (ref 43.0–77.0)
Platelets: 199 10*3/uL (ref 150.0–400.0)
RBC: 4.55 Mil/uL (ref 3.87–5.11)
WBC: 8.8 10*3/uL (ref 4.5–10.5)

## 2013-04-30 LAB — HEPATIC FUNCTION PANEL
ALT: 21 U/L (ref 0–35)
AST: 26 U/L (ref 0–37)
Albumin: 4.1 g/dL (ref 3.5–5.2)
Total Bilirubin: 0.7 mg/dL (ref 0.3–1.2)

## 2013-04-30 LAB — LIPID PANEL
Cholesterol: 179 mg/dL (ref 0–200)
LDL Cholesterol: 115 mg/dL — ABNORMAL HIGH (ref 0–99)
Triglycerides: 64 mg/dL (ref 0.0–149.0)

## 2013-04-30 LAB — TSH: TSH: 3.4 u[IU]/mL (ref 0.35–5.50)

## 2013-04-30 MED ORDER — LEVOTHYROXINE SODIUM 75 MCG PO TABS: 75.0000 ug | ORAL_TABLET | Freq: Every day | ORAL | Status: DC

## 2013-04-30 NOTE — Assessment & Plan Note (Signed)
Mild to mod, for tx asd,  to f/u any worsening symptoms or concerns

## 2013-04-30 NOTE — Patient Instructions (Addendum)
Please continue all other medications as before, and refills have been done if requested. Please have the pharmacy call with any other refills you may need. Please continue your efforts at being more active, low cholesterol diet, and weight control. You are otherwise up to date with prevention measures today. Please go to the LAB in the Basement (turn left off the elevator) for the tests to be done today You will be contacted by phone if any changes need to be made immediately.  Otherwise, you will receive a letter about your results with an explanation, but please check with MyChart first. Thank you for enrolling in MyChart. Please follow the instructions below to securely access your online medical record. MyChart allows you to send messages to your doctor, view your test results, renew your prescriptions, schedule appointments, and more To Log into My Chart online, please go by [facility] or [employer] to [email or web address], or download the MyChart App from the [employer] .  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

## 2013-04-30 NOTE — Assessment & Plan Note (Addendum)
ECG reviewed as per emr, stable overall by history and exam, recent data reviewed with pt, and pt to continue medical treatment as before,  to f/u any worsening symptoms or concerns Lab Results  Component Value Date   LDLCALC 115* 04/30/2013

## 2013-04-30 NOTE — Assessment & Plan Note (Signed)
stable overall by history and exam, recent data reviewed with pt, and pt to continue medical treatment as before,  to f/u any worsening symptoms or concerns Lab Results  Component Value Date   TSH 3.40 04/30/2013    

## 2013-04-30 NOTE — Progress Notes (Signed)
Subjective:    Patient ID: Deborah [name], female    DOB: 02-Sep-1946, 67 y.o.   MRN: [id number]  HPI  Here to f/u; overall doing ok,  Pt denies chest pain, increased sob or doe, wheezing, orthopnea, PND, increased LE swelling, palpitations, dizziness or syncope.  Pt denies polydipsia, polyuria, or low sugar symptoms such as weakness or confusion improved with po intake.  Pt denies new neurological symptoms such as new headache, or facial or extremity weakness or numbness.   Pt states overall good compliance with meds, has been trying to follow lower cholesterol diet, with wt overall stable,  but little exercise however. No new complaints  Does have several wks ongoing nasal allergy symptoms with clearish congestion, itch and sneezing, without fever, pain, ST, cough, swelling or wheezing.  Denies hyper or hypo thyroid symptoms such as voice, skin or hair change. Past Medical History  Diagnosis Date  . Allergy   . Jaundice   . Hyperlipidemia 08/04/2011  . Hypothyroidism 08/04/2011  . Atrophic vaginitis   . 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 Basal cell carcinoma of skin 2010  . Osteopenia 10/2012    T score -1.7 FRAX 7%/0.3%   Past Surgical History  Procedure Laterality Date  . Tonsillectomy  1952  . Vaginal hysterectomy  1980  . Breast biopsy  1999    RADIATION TREATMENTS    reports that she has never smoked. She has never used smokeless tobacco. She reports that she drinks about 1.2 ounces of alcohol per week. She reports that she does not use illicit drugs. family history includes Cancer in her mother; Dementia in her mother; Diabetes in her daughter; and Heart disease in her father and other. No Known Allergies Current Outpatient Prescriptions on File Prior to Visit  Medication Sig Dispense Refill  . peg 3350 powder (MOVIPREP) 100 G SOLR MOVI PREP take as directed  1 kit  0   Current Facility-Administered Medications on File Prior to  Visit  Medication Dose Route Frequency Provider Last Rate Last Dose  . pneumococcal 23 valent vaccine (PNU-IMMUNE) injection 0.5 mL  0.5 mL Intramuscular Once Corwin Levins, MD       Review of Systems  Constitutional: Negative for unexpected weight change, or unusual diaphoresis  HENT: Negative for tinnitus.   Eyes: Negative for photophobia and visual disturbance.  Respiratory: Negative for choking and stridor.   Gastrointestinal: Negative for vomiting and blood in stool.  Genitourinary: Negative for hematuria and decreased urine volume.  Musculoskeletal: Negative for acute joint swelling Skin: Negative for color change and wound.  Neurological: Negative for tremors and numbness other than noted  Psychiatric/Behavioral: Negative for decreased concentration or  hyperactivity.       Objective:   Physical Exam BP 120/80  Pulse 87  Temp(Src) 97.1 F (36.2 C) (Oral)  Ht 5' 9.5" (1.765 m)  Wt 161 lb 4 oz (73.143 kg)  BMI 23.48 kg/m2  SpO2 95%  LMP 10/11/1979 VS noted,  Constitutional: Pt appears well-developed and well-nourished.  HENT: Head: NCAT.  Right Ear: External ear normal.  Left Ear: External ear normal.  Bilat tm's with mild erythema.  Max sinus areas non tender.  Pharynx with mild erythema, no exudate Eyes: Conjunctivae and EOM are normal. Pupils are equal, round, and reactive to light.  Neck: Normal range of motion. Neck supple.  Cardiovascular: Normal rate and regular rhythm.   Pulmonary/Chest: Effort normal and breath sounds normal.  Abd:  Soft, NT, non-distended, + BS Neurological: Pt is alert. Not confused  Skin: Skin is warm. No erythema.  Psychiatric: Pt behavior is normal. Thought content normal.     Assessment & Plan:

## 2013-06-25 ENCOUNTER — Telehealth: Payer: Self-pay | Admitting: Internal Medicine

## 2013-06-25 DIAGNOSIS — H9190 Unspecified hearing loss, unspecified ear: Secondary | ICD-10-CM

## 2013-06-25 NOTE — Telephone Encounter (Signed)
Called the husband and Dr. Hyacinth Meeker he thinks is an Biomedical scientist.  He stated she can be seen this Friday, but needs a referral from PCP per [employer].

## 2013-06-25 NOTE — Telephone Encounter (Signed)
Done per emr 

## 2013-06-25 NOTE — Telephone Encounter (Signed)
Is this audiology, or ENT?

## 2013-06-25 NOTE — Telephone Encounter (Signed)
Patients husband came by to make sure you got a message requesting a referral to Dr. Hyacinth [facility].  Please give a call back regarding this.  Thanks!

## 2013-06-25 NOTE — Telephone Encounter (Signed)
Patients husband informed referral requested has been done.

## 2013-06-30 ENCOUNTER — Telehealth: Payer: Self-pay | Admitting: Internal Medicine

## 2013-06-30 NOTE — Telephone Encounter (Signed)
Rec'd from [facility] forward 3 pages to Dr.John °

## 2013-07-04 ENCOUNTER — Encounter: Payer: Self-pay | Admitting: Internal Medicine

## 2013-07-07 ENCOUNTER — Other Ambulatory Visit: Payer: Self-pay | Admitting: Otolaryngology

## 2013-07-07 DIAGNOSIS — H9393 Unspecified disorder of ear, bilateral: Secondary | ICD-10-CM

## 2013-07-15 ENCOUNTER — Ambulatory Visit
Admission: RE | Admit: 2013-07-15 | Discharge: 2013-07-15 | Disposition: A | Discharge status: Home / Self Care | Payer: Medicare Other | Source: Ambulatory Visit | Attending: Otolaryngology | Admitting: Otolaryngology

## 2013-07-15 DIAGNOSIS — H9393 Unspecified disorder of ear, bilateral: Secondary | ICD-10-CM

## 2013-07-15 MED ORDER — GADOBENATE DIMEGLUMINE 529 MG/ML IV SOLN
15.0000 mL | Freq: Once | INTRAVENOUS | Status: AC | PRN
  Administered 2013-07-15: 15 mL via INTRAVENOUS

## 2013-09-17 LAB — HM MAMMOGRAPHY

## 2013-10-13 ENCOUNTER — Encounter: Payer: Medicare Other | Admitting: Women's Health

## 2013-10-23 ENCOUNTER — Encounter: Payer: Self-pay | Admitting: Women's Health

## 2013-10-23 ENCOUNTER — Other Ambulatory Visit: Payer: Self-pay

## 2013-10-23 ENCOUNTER — Ambulatory Visit (INDEPENDENT_AMBULATORY_CARE_PROVIDER_SITE_OTHER): Payer: Medicare Other | Admitting: Women's Health

## 2013-10-23 VITALS — BP 130/74 | Ht 68.5 in | Wt 160.8 lb

## 2013-10-23 DIAGNOSIS — Z1272 Encounter for screening for malignant neoplasm of vagina: Secondary | ICD-10-CM

## 2013-10-23 NOTE — Patient Instructions (Signed)
Health Recommendations for Postmenopausal Women Respected and ongoing research has looked at the most common causes of death, disability, and poor quality of life in postmenopausal women. The causes include heart disease, diseases of blood vessels, diabetes, depression, cancer, and bone loss (osteoporosis). Many things can be done to help lower the chances of developing these and other common problems: CARDIOVASCULAR DISEASE Heart Disease: A heart attack is a medical emergency. Know the signs and symptoms of a heart attack. Below are things women can do to reduce their risk for heart disease.   Do not smoke. If you smoke, quit.  Aim for a healthy weight. Being overweight causes many preventable deaths. Eat a healthy and balanced diet and drink an adequate amount of liquids.  Get moving. Make a commitment to be more physically active. Aim for 30 minutes of activity on most, if not all days of the week.  Eat for heart health. Choose a diet that is low in saturated fat and cholesterol and eliminate trans fat. Include whole grains, vegetables, and fruits. Read and understand the labels on food containers before buying.  Know your numbers. Ask your caregiver to check your blood pressure, cholesterol (total, HDL, LDL, triglycerides) and blood glucose. Work with your caregiver on improving your entire clinical picture.  High blood pressure. Limit or stop your table salt intake (try salt substitute and food seasonings). Avoid salty foods and drinks. Read labels on food containers before buying. Eating well and exercising can help control high blood pressure. STROKE  Stroke is a medical emergency. Stroke may be the result of a blood clot in a blood vessel in the brain or by a brain hemorrhage (bleeding). Know the signs and symptoms of a stroke. To lower the risk of developing a stroke:  Avoid fatty foods.  Quit smoking.  Control your diabetes, blood pressure, and irregular heart rate. THROMBOPHLEBITIS  (BLOOD CLOT) OF THE LEG  Becoming overweight and leading a stationary lifestyle may also contribute to developing blood clots. Controlling your diet and exercising will help lower the risk of developing blood clots. CANCER SCREENING  Breast Cancer: Take steps to reduce your risk of breast cancer.  You should practice "breast self-awareness." This means understanding the normal appearance and feel of your breasts and should include breast self-examination. Any changes detected, no matter how small, should be reported to your caregiver.  After age 40, you should have a clinical breast exam (CBE) every year.  Starting at age 40, you should consider having a mammogram (breast X-ray) every year.  If you have a family history of breast cancer, talk to your caregiver about genetic screening.  If you are at high risk for breast cancer, talk to your caregiver about having an MRI and a mammogram every year.  Intestinal or Stomach Cancer: Tests to consider are a rectal exam, fecal occult blood, sigmoidoscopy, and colonoscopy. Women who are high risk may need to be screened at an earlier age and more often.  Cervical Cancer:  Beginning at age 30, you should have a Pap test every 3 years as long as the past 3 Pap tests have been normal.  If you have had past treatment for cervical cancer or a condition that could lead to cancer, you need Pap tests and screening for cancer for at least 20 years after your treatment.  If you had a hysterectomy for a problem that was not cancer or a condition that could lead to cancer, then you no longer need Pap tests.    If you are between ages 65 and 70, and you have had normal Pap tests going back 10 years, you no longer need Pap tests.  If Pap tests have been discontinued, risk factors (such as a new sexual partner) need to be reassessed to determine if screening should be resumed.  Some medical problems can increase the chance of getting cervical cancer. In these  cases, your caregiver may recommend more frequent screening and Pap tests.  Uterine Cancer: If you have vaginal bleeding after reaching menopause, you should notify your caregiver.  Ovarian cancer: Other than yearly pelvic exams, there are no reliable tests available to screen for ovarian cancer at this time except for yearly pelvic exams.  Lung Cancer: Yearly chest X-rays can detect lung cancer and should be done on high risk women, such as cigarette smokers and women with chronic lung disease (emphysema).  Skin Cancer: A complete body skin exam should be done at your yearly examination. Avoid overexposure to the sun and ultraviolet light lamps. Use a strong sun block cream when in the sun. All of these things are important in lowering the risk of skin cancer. MENOPAUSE Menopause Symptoms: Hormone therapy products are effective for treating symptoms associated with menopause:  Moderate to severe hot flashes.  Night sweats.  Mood swings.  Headaches.  Tiredness.  Loss of sex drive.  Insomnia.  Other symptoms. Hormone replacement carries certain risks, especially in older women. Women who use or are thinking about using estrogen or estrogen with progestin treatments should discuss that with their caregiver. Your caregiver will help you understand the benefits and risks. The ideal dose of hormone replacement therapy is not known. The Food and Drug Administration (FDA) has concluded that hormone therapy should be used only at the lowest doses and for the shortest amount of time to reach treatment goals.  OSTEOPOROSIS Protecting Against Bone Loss and Preventing Fracture: If you use hormone therapy for prevention of bone loss (osteoporosis), the risks for bone loss must outweigh the risk of the therapy. Ask your caregiver about other medications known to be safe and effective for preventing bone loss and fractures. To guard against bone loss or fractures, the following is recommended:  If  you are less than age 50, take 1000 mg of calcium and at least 600 mg of Vitamin D per day.  If you are greater than age 50 but less than age 70, take 1200 mg of calcium and at least 600 mg of Vitamin D per day.  If you are greater than age 70, take 1200 mg of calcium and at least 800 mg of Vitamin D per day. Smoking and excessive alcohol intake increases the risk of osteoporosis. Eat foods rich in calcium and vitamin D and do weight bearing exercises several times a week as your caregiver suggests. DIABETES Diabetes Melitus: If you have Type I or Type 2 diabetes, you should keep your blood sugar under control with diet, exercise and recommended medication. Avoid too many sweets, starchy and fatty foods. Being overweight can make control more difficult. COGNITION AND MEMORY Cognition and Memory: Menopausal hormone therapy is not recommended for the prevention of cognitive disorders such as Alzheimer's disease or memory loss.  DEPRESSION  Depression may occur at any age, but is common in elderly women. The reasons may be because of physical, medical, social (loneliness), or financial problems and needs. If you are experiencing depression because of medical problems and control of symptoms, talk to your caregiver about this. Physical activity and   exercise may help with mood and sleep. Community and volunteer involvement may help your sense of value and worth. If you have depression and you feel that the problem is getting worse or becoming severe, talk to your caregiver about treatment options that are best for you. ACCIDENTS  Accidents are common and can be serious in the elderly woman. Prepare your house to prevent accidents. Eliminate throw rugs, place hand bars in the bath, shower and toilet areas. Avoid wearing high heeled shoes or walking on wet, snowy, and icy areas. Limit or stop driving if you have vision or hearing problems, or you feel you are unsteady with you movements and  reflexes. HEPATITIS C Hepatitis C is a type of viral infection affecting the liver. It is spread mainly through contact with blood from an infected person. It can be treated, but if left untreated, it can lead to severe liver damage over years. Many people who are infected do not know that the virus is in their blood. If you are a "baby-boomer", it is recommended that you have one screening test for Hepatitis C. IMMUNIZATIONS  Several immunizations are important to consider having during your senior years, including:   Tetanus, diptheria, and pertussis booster shot.  Influenza every year before the flu season begins.  Pneumonia vaccine.  Shingles vaccine.  Others as indicated based on your specific needs. Talk to your caregiver about these. Document Released: 01/26/2006 Document Revised: 11/20/2012 Document Reviewed: 09/21/2008 [employer] Patient Information 2014 [employer].  

## 2013-10-23 NOTE — Progress Notes (Signed)
Deborah [name] October 08, 1946 [id number]    History:    The patient presents for breast and pelvic exam.  TVH for fibroids/1982 on no HRT. 1999 Right breast cancer with radiation and lumpectomy with normal mammograms after. Hypothyroid on Synthroid primary care manages. 2010 Basal skin cancer annual skin checks. Normal Pap history. Normal DEXA 2013 T score -0.4 left femoral neck, FRACX 7%/0.2%. Benign colon polyp 2011.   Past medical history, past surgical history, family history and social history were all reviewed and documented in the EPIC chart. Daughter Irving Burton and 60 year old twins Enid Derry and Delorise Shiner lives with her. Mother history of breast cancer and melanoma lived until her 35s. Son is an Pensions consultant, other daughter doing well. History of Mnire's. Having some inner ear issues.   Exam:  Filed Vitals:   10/23/13 0953  BP: 130/74    General appearance:  Normal Head/Neck:  Normal, without cervical or supraclavicular adenopathy. Thyroid:  Symmetrical, normal in size, without palpable masses or nodularity. Respiratory  Effort:  Normal  Auscultation:  Clear without wheezing or rhonchi Cardiovascular  Auscultation:  Regular rate, without rubs, murmurs or gallops  Edema/varicosities:  Not grossly evident Abdominal  Soft,nontender, without masses, guarding or rebound.  Liver/spleen:  No organomegaly noted  Hernia:  None appreciated  Skin  Inspection:  Grossly normal  Palpation:  Grossly normal Neurologic/psychiatric  Orientation:  Normal with appropriate conversation.  Mood/affect:  Normal  Genitourinary    Breasts: Examined lying and sitting.     Right: Without masses, retractions, discharge or axillary adenopathy.     Left: Without masses, retractions, discharge or axillary adenopathy.   Inguinal/mons:  Normal without inguinal adenopathy  External genitalia:  Normal  BUS/Urethra/Skene's glands:  Normal  Bladder:  Normal  Vagina:  Normal  Cervix:  Absent  Uterus:  Absent  Adnexa/parametria:     Rt: Without masses or tenderness.   Lt: Without masses or tenderness.  Anus and perineum: Normal  Digital rectal exam: Normal sphincter tone without palpated masses or tenderness  Assessment/Plan:  67 y.o.MWF G3P3  for breast and pelvic exam with no complaints.  Hypothyroid-primary care manages labs and meds 1982 [facility] for fibroids 1999 right breast cancer with normal mammograms after Basal skin cancer-dermatologist annual screens  Plan: SBE's, Continue annual mammograms, 3-D tomography reviewed and encouraged history of dense breast. Continue active lifestyle and regular exercise, calcium rich diet, vitamin D 2000 daily encouraged. Instructed to followup with primary care for zostavac,  Declines flu vaccine. Declines BRCA testing.   Harrington Challenger [facility], 10:39 AM 10/23/2013

## 2013-10-24 LAB — URINALYSIS W MICROSCOPIC + REFLEX CULTURE
Glucose, UA: NEGATIVE mg/dL
Hgb urine dipstick: NEGATIVE
Leukocytes, UA: NEGATIVE
Nitrite: NEGATIVE
Protein, ur: NEGATIVE mg/dL
Specific Gravity, Urine: 1.017 (ref 1.005–1.030)
Squamous Epithelial / LPF: NONE SEEN

## 2013-12-23 ENCOUNTER — Other Ambulatory Visit: Payer: Self-pay | Admitting: Dermatology

## 2014-06-16 ENCOUNTER — Other Ambulatory Visit: Payer: Self-pay | Admitting: Internal Medicine

## 2014-08-04 ENCOUNTER — Telehealth: Payer: Self-pay | Admitting: Internal Medicine

## 2014-08-04 NOTE — Telephone Encounter (Signed)
Pt came by office to request Rx refill for LEVOTHYROXINE. Pt states she also needs to know if an appt is necessary for this authorization. Please contact pt when request is reviewed.

## 2014-08-04 NOTE — Telephone Encounter (Signed)
Called the patient and scheduled followup appointment for Thursday August 20 at 2:45.  Patient request labs that day to recheck TSH and will then get her refills done.

## 2014-08-06 ENCOUNTER — Ambulatory Visit (INDEPENDENT_AMBULATORY_CARE_PROVIDER_SITE_OTHER): Payer: Medicare Other | Admitting: Internal Medicine

## 2014-08-06 ENCOUNTER — Encounter: Payer: Self-pay | Admitting: Internal Medicine

## 2014-08-06 ENCOUNTER — Other Ambulatory Visit (INDEPENDENT_AMBULATORY_CARE_PROVIDER_SITE_OTHER): Payer: Medicare Other

## 2014-08-06 VITALS — BP 118/82 | HR 83 | Temp 98.3°F | Ht 69.0 in | Wt 159.4 lb

## 2014-08-06 DIAGNOSIS — E785 Hyperlipidemia, unspecified: Secondary | ICD-10-CM

## 2014-08-06 DIAGNOSIS — J309 Allergic rhinitis, unspecified: Secondary | ICD-10-CM

## 2014-08-06 DIAGNOSIS — E038 Other specified hypothyroidism: Secondary | ICD-10-CM

## 2014-08-06 DIAGNOSIS — Z23 Encounter for immunization: Secondary | ICD-10-CM

## 2014-08-06 LAB — URINALYSIS, ROUTINE W REFLEX MICROSCOPIC
Bilirubin Urine: NEGATIVE
Hgb urine dipstick: NEGATIVE
Ketones, ur: NEGATIVE
Leukocytes, UA: NEGATIVE
NITRITE: NEGATIVE
RBC / HPF: NONE SEEN (ref 0–?)
Total Protein, Urine: NEGATIVE
UROBILINOGEN UA: 0.2 (ref 0.0–1.0)
Urine Glucose: NEGATIVE
pH: 6 (ref 5.0–8.0)

## 2014-08-06 LAB — HEPATIC FUNCTION PANEL
ALT: 18 U/L (ref 0–35)
AST: 28 U/L (ref 0–37)
Albumin: 4 g/dL (ref 3.5–5.2)
Alkaline Phosphatase: 93 U/L (ref 39–117)
BILIRUBIN DIRECT: 0.1 mg/dL (ref 0.0–0.3)
BILIRUBIN TOTAL: 0.5 mg/dL (ref 0.2–1.2)
Total Protein: 6.5 g/dL (ref 6.0–8.3)

## 2014-08-06 LAB — CBC WITH DIFFERENTIAL/PLATELET
BASOS PCT: 0.7 % (ref 0.0–3.0)
Basophils Absolute: 0.1 10*3/uL (ref 0.0–0.1)
EOS ABS: 0.5 10*3/uL (ref 0.0–0.7)
Eosinophils Relative: 4.8 % (ref 0.0–5.0)
HCT: 38.6 % (ref 36.0–46.0)
Hemoglobin: 12.8 g/dL (ref 12.0–15.0)
Lymphocytes Relative: 47.9 % — ABNORMAL HIGH (ref 12.0–46.0)
Lymphs Abs: 4.6 10*3/uL — ABNORMAL HIGH (ref 0.7–4.0)
MCHC: 33.2 g/dL (ref 30.0–36.0)
MCV: 88.8 fl (ref 78.0–100.0)
Monocytes Absolute: 0.8 10*3/uL (ref 0.1–1.0)
Monocytes Relative: 8 % (ref 3.0–12.0)
NEUTROS ABS: 3.7 10*3/uL (ref 1.4–7.7)
NEUTROS PCT: 38.6 % — AB (ref 43.0–77.0)
Platelets: 201 10*3/uL (ref 150.0–400.0)
RBC: 4.35 Mil/uL (ref 3.87–5.11)
RDW: 13 % (ref 11.5–15.5)
WBC: 9.6 10*3/uL (ref 4.0–10.5)

## 2014-08-06 LAB — LIPID PANEL
Cholesterol: 188 mg/dL (ref 0–200)
HDL: 49.3 mg/dL (ref >39.00)
LDL Cholesterol: 116 mg/dL — ABNORMAL HIGH (ref 0–99)
NONHDL: 138.7
Total CHOL/HDL Ratio: 4
Triglycerides: 112 mg/dL (ref 0.0–149.0)
VLDL: 22.4 mg/dL (ref 0.0–40.0)

## 2014-08-06 LAB — BASIC METABOLIC PANEL
BUN: 15 mg/dL (ref 6–23)
CHLORIDE: 105 meq/L (ref 96–112)
CO2: 26 mEq/L (ref 19–32)
CREATININE: 0.8 mg/dL (ref 0.4–1.2)
Calcium: 9.4 mg/dL (ref 8.4–10.5)
GFR: 74.69 mL/min (ref >60.00)
Glucose, Bld: 83 mg/dL (ref 70–99)
POTASSIUM: 4.4 meq/L (ref 3.5–5.1)
SODIUM: 139 meq/L (ref 135–145)

## 2014-08-06 LAB — TSH: TSH: 4.3 u[IU]/mL (ref 0.35–4.50)

## 2014-08-06 MED ORDER — LEVOTHYROXINE SODIUM 75 MCG PO TABS: ORAL_TABLET | ORAL | Status: DC

## 2014-08-06 NOTE — Assessment & Plan Note (Signed)
stable overall by history and exam, recent data reviewed with pt, and pt to continue medical treatment as before,  to f/u any worsening symptoms or concerns Lab Results  Component Value Date   TSH 3.40 04/30/2013

## 2014-08-06 NOTE — Assessment & Plan Note (Signed)
stable overall by history and exam, recent data reviewed with pt, and pt to continue medical treatment as before,  to f/u any worsening symptoms or concerns Lab Results  Component Value Date   WBC 8.8 04/30/2013   HGB 13.8 04/30/2013   HCT 39.7 04/30/2013   PLT 199.0 04/30/2013   GLUCOSE 85 04/30/2013   CHOL 179 04/30/2013   TRIG 64.0 04/30/2013   HDL 51.20 04/30/2013   LDLCALC 115* 04/30/2013   ALT 21 04/30/2013   AST 26 04/30/2013   NA 141 04/30/2013   K 4.4 04/30/2013   CL 109 04/30/2013   CREATININE 0.8 04/30/2013   BUN 15 04/30/2013   CO2 26 04/30/2013   TSH 3.40 04/30/2013

## 2014-08-06 NOTE — Addendum Note (Signed)
Addended by: Sharon Seller B on: 08/06/2014 04:23 PM   Modules accepted: Orders

## 2014-08-06 NOTE — Addendum Note (Signed)
Addended by: Biagio Borg on: 08/06/2014 04:27 PM   Modules accepted: Orders

## 2014-08-06 NOTE — Progress Notes (Signed)
Subjective:    Patient ID: Deborah [name], female    DOB: 1946/05/08, 68 y.o.   MRN: [id number]  HPI Here for yearly f/u;  Overall doing ok;  Pt denies CP, worsening SOB, DOE, wheezing, orthopnea, PND, worsening LE edema, palpitations, dizziness or syncope.  Pt denies neurological change such as new headache, facial or extremity weakness.  Pt denies polydipsia, polyuria, or low sugar symptoms. Pt states overall good compliance with treatment and medications, good tolerability, and has been trying to follow lower cholesterol diet.  Pt denies worsening depressive symptoms, suicidal ideation or panic. No fever, night sweats, wt loss, loss of appetite, or other constitutional symptoms.  Pt states good ability with ADL's, has low fall risk, home safety reviewed and adequate, no other significant changes in hearing or vision, and only occasionally active with exercise.Denies hyper or hypo thyroid symptoms such as voice, skin or hair change.  No significant allergy symtpoms this year Past Medical History  Diagnosis Date  . Allergy   . Jaundice   . Hyperlipidemia 08/04/2011  . Hypothyroidism 08/04/2011  . Atrophic vaginitis   . 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 Basal cell carcinoma of skin 2010  . Osteopenia 10/2012    T score -1.7 FRAX 7%/0.3%  . Vertigo    Past Surgical History  Procedure Laterality Date  . Tonsillectomy  1952  . Vaginal hysterectomy  1980  . Breast biopsy  1999    RADIATION TREATMENTS    reports that she has never smoked. She has never used smokeless tobacco. She reports that she drinks about 1.2 ounces of alcohol per week. She reports that she does not use illicit drugs. family history includes Cancer in her mother; Dementia in her mother; Diabetes in her daughter; Heart disease in her father and other. No Known Allergies Current Outpatient Prescriptions on File Prior to Visit  Medication Sig Dispense Refill  .  Cholecalciferol (VITAMIN D-3) 1000 UNITS CAPS Take by mouth daily.      [facility] PRESCRIPTION MEDICATION DIURETIC- ? NAME       Current Facility-Administered Medications on File Prior to Visit  Medication Dose Route Frequency Provider Last Rate Last Dose  . pneumococcal 23 valent vaccine (PNU-IMMUNE) injection 0.5 mL  0.5 mL Intramuscular Once Biagio Borg, MD         Review of Systems Constitutional: Negative for increased diaphoresis, other activity, appetite or other siginficant weight change  HENT: Negative for worsening hearing loss, ear pain, facial swelling, mouth sores and neck stiffness.   Eyes: Negative for other worsening pain, redness or visual disturbance.  Respiratory: Negative for shortness of breath and wheezing.   Cardiovascular: Negative for chest pain and palpitations.  Gastrointestinal: Negative for diarrhea, blood in stool, abdominal distention or other pain Genitourinary: Negative for hematuria, flank pain or change in urine volume.  Musculoskeletal: Negative for myalgias or other joint complaints.  Skin: Negative for color change and wound.  Neurological: Negative for syncope and numbness. other than noted Hematological: Negative for adenopathy. or other swelling Psychiatric/Behavioral: Negative for hallucinations, self-injury, decreased concentration or other worsening agitation.      Objective:   Physical Exam BP 118/82  Pulse 83  Temp(Src) 98.3 F (36.8 C) (Oral)  Ht 5\' 9"  (1.753 m)  Wt 159 lb 6 oz (72.292 kg)  BMI 23.52 kg/m2  SpO2 96%  LMP 10/11/1979 VS noted,  Constitutional: Pt is oriented to person, place, and time.  Appears well-developed and well-nourished.  Head: Normocephalic and atraumatic.  Right Ear: External ear normal.  Left Ear: External ear normal.  Nose: Nose normal.  Mouth/Throat: Oropharynx is clear and moist.  Eyes: Conjunctivae and EOM are normal. Pupils are equal, round, and reactive to light.  Neck: Normal range of motion. Neck  supple. No JVD present. No tracheal deviation present.  Cardiovascular: Normal rate, regular rhythm, normal heart sounds and intact distal pulses.   Pulmonary/Chest: Effort normal and breath sounds without rales or wheezing  Abdominal: Soft. Bowel sounds are normal. NT. No HSM  Musculoskeletal: Normal range of motion. Exhibits no edema.  Lymphadenopathy:  Has no cervical adenopathy.  Neurological: Pt is alert and oriented to person, place, and time. Pt has normal reflexes. No cranial nerve deficit. Motor grossly intact Skin: Skin is warm and dry. No rash noted.  Psychiatric:  Has normal mood and affect. Behavior is normal.     Assessment & Plan:

## 2014-08-06 NOTE — Patient Instructions (Addendum)
You had the new Prevnar pneumonia shot today  Please continue all other medications as before, and refills have been done if requested.  Please have the pharmacy call with any other refills you may need.  Please continue your efforts at being more active, low cholesterol diet, and weight control.  You are otherwise up to date with prevention measures today.  Please keep your appointments with your specialists as you may have planned  Please go to the LAB in the Basement (turn left off the elevator) for the tests to be done today  You will be contacted by phone if any changes need to be made immediately.  Otherwise, you will receive a letter about your results with an explanation, but please check with MyChart first.  Please remember to sign up for MyChart if you have not done so, as this will be important to you in the future with finding out test results, communicating by private email, and scheduling acute appointments online when needed.  Please return in 1 year for your yearly visit, or sooner if needed 

## 2014-08-06 NOTE — Assessment & Plan Note (Signed)
stable overall by history and exam, recent data reviewed with pt, and pt to continue medical treatment as before,  to f/u any worsening symptoms or concerns Lab Results  Component Value Date   LDLCALC 115* 04/30/2013   For lab f/u

## 2014-08-06 NOTE — Progress Notes (Signed)
Pre visit review using our clinic review tool, if applicable. No additional management support is needed unless otherwise documented below in the visit note. 

## 2014-10-19 ENCOUNTER — Encounter: Payer: Self-pay | Admitting: Internal Medicine

## 2014-10-27 ENCOUNTER — Encounter: Payer: Self-pay | Admitting: Women's Health

## 2014-10-27 ENCOUNTER — Ambulatory Visit (INDEPENDENT_AMBULATORY_CARE_PROVIDER_SITE_OTHER): Payer: Medicare Other | Admitting: Women's Health

## 2014-10-27 VITALS — BP 124/80 | Ht 69.0 in | Wt 159.0 lb

## 2014-10-27 DIAGNOSIS — M8580 Other specified disorders of bone density and structure, unspecified site: Secondary | ICD-10-CM

## 2014-10-27 NOTE — Patient Instructions (Signed)
Health Recommendations for Postmenopausal Women Respected and ongoing research has looked at the most common causes of death, disability, and poor quality of life in postmenopausal women. The causes include heart disease, diseases of blood vessels, diabetes, depression, cancer, and bone loss (osteoporosis). Many things can be done to help lower the chances of developing these and other common problems. CARDIOVASCULAR DISEASE Heart Disease: A heart attack is a medical emergency. Know the signs and symptoms of a heart attack. Below are things women can do to reduce their risk for heart disease.   Do not smoke. If you smoke, quit.  Aim for a healthy weight. Being overweight causes many preventable deaths. Eat a healthy and balanced diet and drink an adequate amount of liquids.  Get moving. Make a commitment to be more physically active. Aim for 30 minutes of activity on most, if not all days of the week.  Eat for heart health. Choose a diet that is low in saturated fat and cholesterol and eliminate trans fat. Include whole grains, vegetables, and fruits. Read and understand the labels on food containers before buying.  Know your numbers. Ask your caregiver to check your blood pressure, cholesterol (total, HDL, LDL, triglycerides) and blood glucose. Work with your caregiver on improving your entire clinical picture.  High blood pressure. Limit or stop your table salt intake (try salt substitute and food seasonings). Avoid salty foods and drinks. Read labels on food containers before buying. Eating well and exercising can help control high blood pressure. STROKE  Stroke is a medical emergency. Stroke may be the result of a blood clot in a blood vessel in the brain or by a brain hemorrhage (bleeding). Know the signs and symptoms of a stroke. To lower the risk of developing a stroke:  Avoid fatty foods.  Quit smoking.  Control your diabetes, blood pressure, and irregular heart rate. THROMBOPHLEBITIS  (BLOOD CLOT) OF THE LEG  Becoming overweight and leading a stationary lifestyle may also contribute to developing blood clots. Controlling your diet and exercising will help lower the risk of developing blood clots. CANCER SCREENING  Breast Cancer: Take steps to reduce your risk of breast cancer.  You should practice "breast self-awareness." This means understanding the normal appearance and feel of your breasts and should include breast self-examination. Any changes detected, no matter how small, should be reported to your caregiver.  After age 40, you should have a clinical breast exam (CBE) every year.  Starting at age 40, you should consider having a mammogram (breast X-ray) every year.  If you have a family history of breast cancer, talk to your caregiver about genetic screening.  If you are at high risk for breast cancer, talk to your caregiver about having an MRI and a mammogram every year.  Intestinal or Stomach Cancer: Tests to consider are a rectal exam, fecal occult blood, sigmoidoscopy, and colonoscopy. Women who are high risk may need to be screened at an earlier age and more often.  Cervical Cancer:  Beginning at age 30, you should have a Pap test every 3 years as long as the past 3 Pap tests have been normal.  If you have had past treatment for cervical cancer or a condition that could lead to cancer, you need Pap tests and screening for cancer for at least 20 years after your treatment.  If you had a hysterectomy for a problem that was not cancer or a condition that could lead to cancer, then you no longer need Pap tests.    If you are between ages 65 and 70, and you have had normal Pap tests going back 10 years, you no longer need Pap tests.  If Pap tests have been discontinued, risk factors (such as a new sexual partner) need to be reassessed to determine if screening should be resumed.  Some medical problems can increase the chance of getting cervical cancer. In these  cases, your caregiver may recommend more frequent screening and Pap tests.  Uterine Cancer: If you have vaginal bleeding after reaching menopause, you should notify your caregiver.  Ovarian Cancer: Other than yearly pelvic exams, there are no reliable tests available to screen for ovarian cancer at this time except for yearly pelvic exams.  Lung Cancer: Yearly chest X-rays can detect lung cancer and should be done on high risk women, such as cigarette smokers and women with chronic lung disease (emphysema).  Skin Cancer: A complete body skin exam should be done at your yearly examination. Avoid overexposure to the sun and ultraviolet light lamps. Use a strong sun block cream when in the sun. All of these things are important for lowering the risk of skin cancer. MENOPAUSE Menopause Symptoms: Hormone therapy products are effective for treating symptoms associated with menopause:  Moderate to severe hot flashes.  Night sweats.  Mood swings.  Headaches.  Tiredness.  Loss of sex drive.  Insomnia.  Other symptoms. Hormone replacement carries certain risks, especially in older women. Women who use or are thinking about using estrogen or estrogen with progestin treatments should discuss that with their caregiver. Your caregiver will help you understand the benefits and risks. The ideal dose of hormone replacement therapy is not known. The Food and Drug Administration (FDA) has concluded that hormone therapy should be used only at the lowest doses and for the shortest amount of time to reach treatment goals.  OSTEOPOROSIS Protecting Against Bone Loss and Preventing Fracture If you use hormone therapy for prevention of bone loss (osteoporosis), the risks for bone loss must outweigh the risk of the therapy. Ask your caregiver about other medications known to be safe and effective for preventing bone loss and fractures. To guard against bone loss or fractures, the following is recommended:  If  you are younger than age 50, take 1000 mg of calcium and at least 600 mg of Vitamin D per day.  If you are older than age 50 but younger than age 70, take 1200 mg of calcium and at least 600 mg of Vitamin D per day.  If you are older than age 70, take 1200 mg of calcium and at least 800 mg of Vitamin D per day. Smoking and excessive alcohol intake increases the risk of osteoporosis. Eat foods rich in calcium and vitamin D and do weight bearing exercises several times a week as your caregiver suggests. DIABETES Diabetes Mellitus: If you have type I or type 2 diabetes, you should keep your blood sugar under control with diet, exercise, and recommended medication. Avoid starchy and fatty foods, and too many sweets. Being overweight can make diabetes control more difficult. COGNITION AND MEMORY Cognition and Memory: Menopausal hormone therapy is not recommended for the prevention of cognitive disorders such as Alzheimer's disease or memory loss.  DEPRESSION  Depression may occur at any age, but it is common in elderly women. This may be because of physical, medical, social (loneliness), or financial problems and needs. If you are experiencing depression because of medical problems and control of symptoms, talk to your caregiver about this. Physical   activity and exercise may help with mood and sleep. Community and volunteer involvement may improve your sense of value and worth. If you have depression and you feel that the problem is getting worse or becoming severe, talk to your caregiver about which treatment options are best for you. ACCIDENTS  Accidents are common and can be serious in elderly woman. Prepare your house to prevent accidents. Eliminate throw rugs, place hand bars in bath, shower, and toilet areas. Avoid wearing high heeled shoes or walking on wet, snowy, and icy areas. Limit or stop driving if you have vision or hearing problems, or if you feel you are unsteady with your movements and  reflexes. HEPATITIS C Hepatitis C is a type of viral infection affecting the liver. It is spread mainly through contact with blood from an infected person. It can be treated, but if left untreated, it can lead to severe liver damage over the years. Many people who are infected do not know that the virus is in their blood. If you are a "baby-boomer", it is recommended that you have one screening test for Hepatitis C. IMMUNIZATIONS  Several immunizations are important to consider having during your senior years, including:   Tetanus, diphtheria, and pertussis booster shot.  Influenza every year before the flu season begins.  Pneumonia vaccine.  Shingles vaccine.  Others, as indicated based on your specific needs. Talk to your caregiver about these. Document Released: 01/26/2006 Document Revised: 04/20/2014 Document Reviewed: 09/21/2008 [employer] Patient Information 2015 [employer]. This information is not intended to replace advice given to you by your health care provider. Make sure you discuss any questions you have with your health care provider.  

## 2014-10-27 NOTE — Progress Notes (Signed)
Deborah [name] Feb 08, 1946 [id number]    History:    Presents for Breast and pelvic exam. 1980 TVH on no HRT. Normal Pap history. Rare sexual activity, husbands health. 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 Normal mammograms after. 2013 Osteopenia T score -1.7 FRAX 7%/0.3%. Current on immunizations. Hypothyroid managed by primary care.  Past medical history, past surgical history, family history and social history were all reviewed and documented in the EPIC chart. Daughter Raquel Sarna and her twins age 58 live with her, twins doing well, Raquel Sarna struggles emotionally. Has a son and a daughter both doing well.  ROS:  A  12 point ROS was performed and pertinent positives and negatives are included.  Exam:  Filed Vitals:   10/27/14 0943  BP: 124/80    General appearance:  Normal Thyroid:  Symmetrical, normal in size, without palpable masses or nodularity. Respiratory  Auscultation:  Clear without wheezing or rhonchi Cardiovascular  Auscultation:  Regular rate, without rubs, murmurs or gallops  Edema/varicosities:  Not grossly evident Abdominal  Soft,nontender, without masses, guarding or rebound.  Liver/spleen:  No organomegaly noted  Hernia:  None appreciated  Skin  Inspection:  Grossly normal   Breasts: Examined lying and sitting.     Right: Well-healed scar, no palpable nodules.     Left: Without masses, retractions, discharge or axillary adenopathy. Gentitourinary   Inguinal/mons:  Normal without inguinal adenopathy  External genitalia:  Normal  BUS/Urethra/Skene's glands:  Normal  Vagina:  Normal  Cervix:  absent Uterus:absent  Adnexa/parametria:     Rt: Without masses or tenderness.   Lt: Without masses or tenderness.  Anus and perineum: Normal  Digital rectal exam: Normal sphincter tone without palpated masses or tenderness  Assessment/Plan:  68 y.o.MWF G3P3  For breast and pelvic exam.  1980 TVH no HRT 1998 right breast ductal  carcinoma Osteopenia without elevated FRAX risk Hypothyroid-primary care manages labs and meds  Plan: Home safety, fall prevention and importance of regular exercise reviewed. Repeat DEXA. Vitamin D 2000 daily, calcium rich diet, SBE's,continue annual mammogram, 3-D tomography reviewed and encouraged history of dense breasts. Encouraged  Zostavax, has had Pneumovax. Follow up with gastroenterologist, history of diverticulosis.     [place], 1:33 PM 10/27/2014

## 2014-11-05 ENCOUNTER — Encounter: Payer: Self-pay | Admitting: Women's Health

## 2015-05-28 DIAGNOSIS — H8101 Meniere's disease, right ear: Secondary | ICD-10-CM | POA: Diagnosis not present

## 2015-07-28 ENCOUNTER — Ambulatory Visit (INDEPENDENT_AMBULATORY_CARE_PROVIDER_SITE_OTHER): Payer: Medicare Other | Admitting: Internal Medicine

## 2015-07-28 VITALS — BP 118/80 | HR 86 | Temp 97.8°F | Ht 69.0 in | Wt 157.0 lb

## 2015-07-28 DIAGNOSIS — R0989 Other specified symptoms and signs involving the circulatory and respiratory systems: Secondary | ICD-10-CM | POA: Diagnosis not present

## 2015-07-28 DIAGNOSIS — R059 Cough, unspecified: Secondary | ICD-10-CM

## 2015-07-28 DIAGNOSIS — R0689 Other abnormalities of breathing: Secondary | ICD-10-CM | POA: Insufficient documentation

## 2015-07-28 DIAGNOSIS — R05 Cough: Secondary | ICD-10-CM

## 2015-07-28 MED ORDER — ALBUTEROL SULFATE HFA 108 (90 BASE) MCG/ACT IN AERS: 2.0000 | INHALATION_SPRAY | Freq: Four times a day (QID) | RESPIRATORY_TRACT | Status: AC | PRN

## 2015-07-28 MED ORDER — LEVOFLOXACIN 250 MG PO TABS: 250.0000 mg | ORAL_TABLET | Freq: Every day | ORAL | Status: DC

## 2015-07-28 NOTE — Progress Notes (Signed)
   Subjective:    Patient ID: Deborah [name], female    DOB: [birth year], 69 y.o.   MRN: [id number]  HPI  Here with 2-3 days somewhat vague c/os of feeling poorly, scant prod cough, mild sob and a kind of wheezing only with lying down at night and sensation of chest congestion, cant seem to clear it. No fever, and Pt denies chest pain, orthopnea, PND, increased LE swelling, palpitations, dizziness or syncope.  No hx of diast CHF, no ST, HA, sinus symptoms.  Pt denies new neurological symptoms such as new headache, or facial or extremity weakness or numbness  Denies worsening reflux, abd pain, dysphagia, n/v, bowel change or blood. Past Medical History  Diagnosis Date  . Allergy   . Jaundice   . Hyperlipidemia 08/04/2011  . Hypothyroidism 08/04/2011  . Atrophic vaginitis   . 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 Basal cell carcinoma of skin 2010  . Osteopenia 10/2012    T score -1.7 FRAX 7%/0.3%  . Vertigo    Past Surgical History  Procedure Laterality Date  . Tonsillectomy  1952  . Vaginal hysterectomy  1980  . Breast biopsy  1999    RADIATION TREATMENTS    reports that she has never smoked. She has never used smokeless tobacco. She reports that she drinks about 1.2 oz of alcohol per week. She reports that she does not use illicit drugs. family history includes Cancer in her mother; Dementia in her mother; Diabetes in her daughter; Heart disease in her father and other. No Known Allergies Current Outpatient Prescriptions on File Prior to Visit  Medication Sig Dispense Refill  . Cholecalciferol (VITAMIN D-3) 1000 UNITS CAPS Take by mouth daily.    Marland Kitchen levothyroxine (SYNTHROID, LEVOTHROID) 75 MCG tablet TAKE 1 TABLET BY MOUTH DAILY. 90 tablet 3  . PRESCRIPTION MEDICATION DIURETIC- ? NAME     No current facility-administered medications on file prior to visit.   Review of Systems  Constitutional: Negative for unusual diaphoresis or  night sweats HENT: Negative for ringing in ear or discharge Eyes: Negative for double vision or worsening visual disturbance.  Respiratory: Negative for choking and stridor.   Gastrointestinal: Negative for vomiting or other signifcant bowel change Genitourinary: Negative for hematuria or change in urine volume.  Musculoskeletal: Negative for other MSK pain or swelling Skin: Negative for color change and worsening wound.  Neurological: Negative for tremors and numbness other than noted  Psychiatric/Behavioral: Negative for decreased concentration or agitation other than above       Objective:   Physical Exam BP 118/80 mmHg  Pulse 86  Temp(Src) 97.8 F (36.6 C) (Oral)  Ht '5\' 9"'$  (1.753 m)  Wt 157 lb (71.215 kg)  BMI 23.17 kg/m2  SpO2 97%  LMP 10/11/1979 VS noted,  Constitutional: Pt appears in no significant distress HENT: Head: NCAT.  Right Ear: External ear normal.  Left Ear: External ear normal.  Eyes: . Pupils are equal, round, and reactive to light. Conjunctivae and EOM are normal Neck: Normal range of motion. Neck supple.  Cardiovascular: Normal rate and regular rhythm.   Pulmonary/Chest: Effort normal and breath sounds somewhat decrease without wheezing but with few RLL rales  Neurological: Pt is alert. Not confused , motor grossly intact Skin: Skin is warm. No rash, no LE edema Psychiatric: Pt behavior is normal. No agitation.     Assessment & Plan:

## 2015-07-28 NOTE — Progress Notes (Signed)
Pre visit review using our clinic review tool, if applicable. No additional management support is needed unless otherwise documented below in the visit note. 

## 2015-07-28 NOTE — Patient Instructions (Signed)
Please take all new medication as prescribed - the antibiotic  You can also take Delsym OTC for cough, and/or Mucinex (or it's generic off brand) for congestion, and tylenol as needed for pain.  Please continue all other medications as before, and refills have been done if requested.  Please have the pharmacy call with any other refills you may need.  Please keep your appointments with your specialists as you may have planned  Please go to the XRAY Department in the Basement (go straight as you get off the elevator) for the x-ray testing  You will be contacted by phone if any changes need to be made immediately.  Otherwise, you will receive a letter about your results with an explanation, but please check with MyChart first.  Please remember to sign up for MyChart if you have not done so, as this will be important to you in the future with finding out test results, communicating by private email, and scheduling acute appointments online when needed.

## 2015-07-29 ENCOUNTER — Ambulatory Visit (INDEPENDENT_AMBULATORY_CARE_PROVIDER_SITE_OTHER)
Admission: RE | Admit: 2015-07-29 | Discharge: 2015-07-29 | Disposition: A | Discharge status: Home / Self Care | Payer: Medicare Other | Source: Ambulatory Visit | Attending: Internal Medicine | Admitting: Internal Medicine

## 2015-07-29 DIAGNOSIS — R059 Cough, unspecified: Secondary | ICD-10-CM

## 2015-07-29 DIAGNOSIS — R0989 Other specified symptoms and signs involving the circulatory and respiratory systems: Secondary | ICD-10-CM | POA: Diagnosis not present

## 2015-07-29 DIAGNOSIS — R05 Cough: Secondary | ICD-10-CM | POA: Diagnosis not present

## 2015-07-29 DIAGNOSIS — R0689 Other abnormalities of breathing: Secondary | ICD-10-CM

## 2015-07-29 DIAGNOSIS — R0602 Shortness of breath: Secondary | ICD-10-CM | POA: Diagnosis not present

## 2015-07-29 NOTE — Assessment & Plan Note (Signed)
Though no fever and subtle exam, I suspect pt has atypical pna, for levaquin asd, cough med prn, for cxr in AM as is closed at time of visit

## 2015-07-29 NOTE — Assessment & Plan Note (Signed)
Likely pna but cant r/o other such as asymmteric chf new onset - for cxr,  to f/u any worsening symptoms or concerns, also for trial proair hfa prn

## 2015-08-16 ENCOUNTER — Ambulatory Visit (INDEPENDENT_AMBULATORY_CARE_PROVIDER_SITE_OTHER): Payer: Medicare Other | Admitting: Internal Medicine

## 2015-08-16 ENCOUNTER — Encounter: Payer: Self-pay | Admitting: Internal Medicine

## 2015-08-16 ENCOUNTER — Ambulatory Visit (INDEPENDENT_AMBULATORY_CARE_PROVIDER_SITE_OTHER)
Admission: RE | Admit: 2015-08-16 | Discharge: 2015-08-16 | Disposition: A | Discharge status: Home / Self Care | Payer: Medicare Other | Source: Ambulatory Visit | Attending: Internal Medicine | Admitting: Internal Medicine

## 2015-08-16 ENCOUNTER — Other Ambulatory Visit (INDEPENDENT_AMBULATORY_CARE_PROVIDER_SITE_OTHER): Payer: Medicare Other

## 2015-08-16 ENCOUNTER — Other Ambulatory Visit: Payer: Self-pay | Admitting: Internal Medicine

## 2015-08-16 VITALS — BP 118/68 | HR 113 | Temp 98.2°F | Resp 20 | Wt 152.0 lb

## 2015-08-16 DIAGNOSIS — R0609 Other forms of dyspnea: Secondary | ICD-10-CM

## 2015-08-16 DIAGNOSIS — J189 Pneumonia, unspecified organism: Secondary | ICD-10-CM

## 2015-08-16 DIAGNOSIS — R05 Cough: Secondary | ICD-10-CM | POA: Diagnosis not present

## 2015-08-16 LAB — CBC WITH DIFFERENTIAL/PLATELET
BASOS PCT: 0.5 % (ref 0.0–3.0)
Basophils Absolute: 0.1 10*3/uL (ref 0.0–0.1)
EOS PCT: 1.9 % (ref 0.0–5.0)
Eosinophils Absolute: 0.3 10*3/uL (ref 0.0–0.7)
HCT: 42.9 % (ref 36.0–46.0)
Hemoglobin: 14.6 g/dL (ref 12.0–15.0)
LYMPHS ABS: 4.5 10*3/uL — AB (ref 0.7–4.0)
Lymphocytes Relative: 32.2 % (ref 12.0–46.0)
MCHC: 34.1 g/dL (ref 30.0–36.0)
MCV: 86.5 fl (ref 78.0–100.0)
MONO ABS: 1.3 10*3/uL — AB (ref 0.1–1.0)
Monocytes Relative: 9.4 % (ref 3.0–12.0)
NEUTROS ABS: 7.8 10*3/uL — AB (ref 1.4–7.7)
NEUTROS PCT: 56 % (ref 43.0–77.0)
Platelets: 345 10*3/uL (ref 150.0–400.0)
RBC: 4.96 Mil/uL (ref 3.87–5.11)
RDW: 12.7 % (ref 11.5–15.5)
WBC: 13.9 10*3/uL — ABNORMAL HIGH (ref 4.0–10.5)

## 2015-08-16 MED ORDER — LEVOFLOXACIN 500 MG PO TABS: 500.0000 mg | ORAL_TABLET | Freq: Every day | ORAL | Status: DC

## 2015-08-16 MED ORDER — PREDNISONE 20 MG PO TABS: 20.0000 mg | ORAL_TABLET | Freq: Two times a day (BID) | ORAL | Status: DC

## 2015-08-16 NOTE — Progress Notes (Signed)
Pre visit review using our clinic review tool, if applicable. No additional management support is needed unless otherwise documented below in the visit note. 

## 2015-08-16 NOTE — Patient Instructions (Signed)
  Your next office appointment will be determined based upon review of your pending labs  and  xrays  Those written interpretation of the lab results and instructions will be transmitted to you by My Chart   Critical results will be called.   Followup as needed for any active or acute issue. Please report any significant change in your symptoms. 

## 2015-08-16 NOTE — Progress Notes (Signed)
   Subjective:    Patient ID: Deborah [name], female    DOB: [birth year], 69 y.o.   MRN: [id number]  HPI  She began coughing approximately 07/22/15 associated with some right pleuritic type discomfort. Prior to onset of symptoms there was no unusual travel or exposures.  She was seen 8/11 and found to have bilateral atypical pneumonia type picture on x-ray. She was placed on Levaquin 250 mg daily for 10 days. She continues to have marked exertional dyspnea and persistent tightness in her chest. Over the weekend 8/26-28 she essentially was sedentary because of the exertional dyspnea. She has some associated nocturnal wheezing. She has not found albuterol to be of benefit. The cough remains nonproductive. She occasionally feels warm and chilled. She's had associated anorexia with weight loss of several pounds.  She also has occasional mild dysphagia type symptoms. She has no other GI, upper respiratory tract infection, or lower respiratory tract symptoms  There is no personal or family history of asthma, chronic bronchitis, tuberculosis, or other lung disease. She's never smoked.  She had breast cancer 1998 and had lumpectomy followed by radiation. She did not take chemotherapy  Review of Systems Frontal headache, facial pain , nasal purulence, dental pain, sore throat , otic pain or otic discharge denied. No definite fever. Abdominal pain, significant dyspepsia,  melena, rectal bleeding, or persistently small caliber stools are denied. She has no associated diarrhea.    Objective:   Physical Exam  She appears markedly fatigued. Slight cyanosis of the lips is suggested despite the O2 sats as recorded. She exhibits a resting tach with a gallop cadence. Pedal pulses are decreased.  General appearance:Adequately nourished; no acute distress or increased work of breathing is present.    Lymphatic: No  lymphadenopathy about the head, neck, or axilla .  Eyes: No conjunctival inflammation or lid  edema is present. There is no scleral icterus.  Ears:  External ear exam shows no significant lesions or deformities.  Otoscopic examination reveals clear canals, tympanic membranes are intact bilaterally without bulging, retraction, inflammation or discharge.  Nose:  External nasal examination shows no deformity or inflammation. Nasal mucosa are pink and moist without lesions or exudates No septal dislocation or deviation.No obstruction to airflow.   Oral exam: Dental hygiene is good; lips and gums are healthy appearing.There is no oropharyngeal erythema or exudate .  Neck:  No deformities, thyromegaly, masses, or tenderness noted.   Supple with full range of motion without pain.   Heart:  regular rhythm. S1 and S2 normal without  murmur, click, rub or other extra sounds.   Lungs:Bibasilar rales present w/o rub.  Extremities:  No  edema or clubbing  noted    Skin: Warm & dry w/o tenting or jaundice. No significant lesions or rash.      Assessment & Plan:  #1 atypical community-acquired pneumonia with reticular-nodular pattern in the right lower lobe greater than left lower lobe.  #2 persistent exertional dyspnea  #3 past medical history breast cancer  Plan: See orders and recommendations referral to pulmonary critical care maybe necessary.

## 2015-08-18 ENCOUNTER — Telehealth: Payer: Self-pay | Admitting: *Deleted

## 2015-08-18 ENCOUNTER — Telehealth: Payer: Self-pay | Admitting: Pulmonary Disease

## 2015-08-18 ENCOUNTER — Encounter: Payer: Self-pay | Admitting: Pulmonary Disease

## 2015-08-18 ENCOUNTER — Ambulatory Visit (INDEPENDENT_AMBULATORY_CARE_PROVIDER_SITE_OTHER)
Admission: RE | Admit: 2015-08-18 | Discharge: 2015-08-18 | Disposition: A | Discharge status: Home / Self Care | Payer: Medicare Other | Source: Ambulatory Visit | Attending: Pulmonary Disease | Admitting: Pulmonary Disease

## 2015-08-18 ENCOUNTER — Ambulatory Visit (INDEPENDENT_AMBULATORY_CARE_PROVIDER_SITE_OTHER): Payer: Medicare Other | Admitting: Pulmonary Disease

## 2015-08-18 ENCOUNTER — Other Ambulatory Visit (INDEPENDENT_AMBULATORY_CARE_PROVIDER_SITE_OTHER): Payer: Medicare Other

## 2015-08-18 VITALS — BP 108/70 | HR 103 | Ht 69.5 in | Wt 152.2 lb

## 2015-08-18 DIAGNOSIS — J90 Pleural effusion, not elsewhere classified: Secondary | ICD-10-CM

## 2015-08-18 DIAGNOSIS — D491 Neoplasm of unspecified behavior of respiratory system: Secondary | ICD-10-CM | POA: Diagnosis not present

## 2015-08-18 DIAGNOSIS — J189 Pneumonia, unspecified organism: Secondary | ICD-10-CM

## 2015-08-18 DIAGNOSIS — J181 Lobar pneumonia, unspecified organism: Secondary | ICD-10-CM | POA: Diagnosis not present

## 2015-08-18 DIAGNOSIS — J948 Other specified pleural conditions: Secondary | ICD-10-CM

## 2015-08-18 DIAGNOSIS — Z853 Personal history of malignant neoplasm of breast: Secondary | ICD-10-CM

## 2015-08-18 LAB — BASIC METABOLIC PANEL
BUN: 21 mg/dL (ref 6–23)
CALCIUM: 9.9 mg/dL (ref 8.4–10.5)
CO2: 27 meq/L (ref 19–32)
CREATININE: 0.86 mg/dL (ref 0.40–1.20)
Chloride: 103 mEq/L (ref 96–112)
GFR: 69.49 mL/min (ref >60.00)
Glucose, Bld: 123 mg/dL — ABNORMAL HIGH (ref 70–99)
Potassium: 4.7 mEq/L (ref 3.5–5.1)
SODIUM: 139 meq/L (ref 135–145)

## 2015-08-18 MED ORDER — IOHEXOL 300 MG/ML  SOLN
80.0000 mL | Freq: Once | INTRAMUSCULAR | Status: AC | PRN
  Administered 2015-08-18: 80 mL via INTRAVENOUS

## 2015-08-18 NOTE — Patient Instructions (Addendum)
Will schedule CT chest with contrast >> needs to be done today  Follow up in 1 week with chest xray >> can be with Dr. Halford Chessman or [facility]

## 2015-08-18 NOTE — Telephone Encounter (Signed)
CT chest 08/18/15 >> 1.4 cm Rt paratracheal LAN, 1.7 cm subcarinal LAN, 1.7 cm Rt hilar LAN, mod Rt effusion, 2.4 cm mass RUL, innumerable b/l nodules, lymphangitic spread   Results d/w pt and her husband over the phone.  Explained that concern is for tumor rather than infection.  Will arrange for Rt thoracentesis first.  If this is non diagnostic, she would then need bronchoscopy.

## 2015-08-18 NOTE — Telephone Encounter (Signed)
Opened in error

## 2015-08-18 NOTE — Progress Notes (Deleted)
   Subjective:    Patient ID: Deborah [name], female    DOB: 1946-07-17, 69 y.o.   MRN: [id number]  HPI    Review of Systems  Constitutional: Positive for appetite change and unexpected weight change. Negative for fever.  HENT: Positive for trouble swallowing. Negative for congestion, dental problem, ear pain, nosebleeds, postnasal drip, rhinorrhea, sinus pressure, sneezing and sore throat.   Eyes: Negative for redness and itching.  Respiratory: Positive for cough, shortness of breath and wheezing. Negative for chest tightness.   Cardiovascular: Positive for chest pain. Negative for palpitations and leg swelling.  Gastrointestinal: Negative for nausea and vomiting.  Genitourinary: Negative for dysuria.  Musculoskeletal: Negative for joint swelling.  Skin: Negative for rash.  Neurological: Negative for headaches.  Hematological: Does not bruise/bleed easily.  Psychiatric/Behavioral: Negative for dysphoric mood. The patient is not nervous/anxious.        Objective:   Physical Exam        Assessment & Plan:

## 2015-08-18 NOTE — Telephone Encounter (Signed)
IMPRESSION: 1. Innumerable pulmonary nodules and thoracic adenopathy, most consistent with metastatic disease. Favor breast cancer metastasis. Metastatic disease from a second primary (i.e. lung) could look similar. 2. 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 Areas of consolidation, most apparent in the right middle lobe, could be neoplastic (metastatic disease) or less like represent concurrent infection. 3. Right pleural effusion. 4. Mild left low jugular/supraclavicular adenopathy, suspicious for metastatic disease. ---  Schedule has VS off this afternoon. Will forward to DOD. Please advise RB thanks

## 2015-08-18 NOTE — Progress Notes (Signed)
Chief Complaint  Patient presents with  . Advice Only    refer Dr. Linna Darner. pt dx with PNA 8/11/6. Pt c/o increase SOB w/ activity, dry cough, wheezing, some chest tx.     History of Present Illness: Deborah [name] is a 69 y.o. female for evaluation of pneumonia.  She was doing well until earlier this month.  She started getting short of breath and dry cough.  She was feeling short of breath with activity, and now energy.  She would get tight in her chest and pain in her Rt chest.  She was also getting chills and sweats.  She had episodes of wheezing at night.  She was seen by PCP on 8/10 and CXR showed PNA.  She was tx with levaquin 250 mg for 7 days.  Her symptoms did not improve.  She was seen in primary care office.  CXR showed progression of Rt infiltrate with effusion.  She was started again on levaquin at 500 mg daily this time on 8/29, and also started on prednisone.  Her breathing, cough, and chest discomfort have improved some.  She denies hx of asthma, or allergies.  There is no hx of prior pneumonia or exposure to TB.  She denies animal exposure or sick exposures.  There is no history of smoking.  She has lived in [place] her entire life, and has not travelled recently.   Lezlie Lye  has a past medical history of Allergy; Jaundice; Hyperlipidemia (08/04/2011); Hypothyroidism (08/04/2011); Atrophic vaginitis; Cancer (1999); Basal cell carcinoma of skin (2010); Osteopenia (10/2012); and Vertigo.  YAMIL DOUGHER  has past surgical history that includes Tonsillectomy (1952); Vaginal hysterectomy (1980); Breast biopsy (1999); and Breast lumpectomy (1998).  Prior to Admission medications   Medication Sig Start Date End Date Taking? Authorizing Provider  albuterol (PROVENTIL HFA;VENTOLIN HFA) 108 (90 BASE) MCG/ACT inhaler Inhale 2 puffs into the lungs every 6 (six) hours as needed for wheezing or shortness of breath. 07/28/15  Yes Biagio Borg, MD  Cholecalciferol  (VITAMIN D-3) 1000 UNITS CAPS Take by mouth daily.   Yes Historical Provider, MD  levofloxacin (LEVAQUIN) 500 MG tablet Take 1 tablet (500 mg total) by mouth daily. 08/16/15  Yes Hendricks Limes, MD  levothyroxine (SYNTHROID, LEVOTHROID) 75 MCG tablet TAKE 1 TABLET BY MOUTH DAILY. 08/06/14  Yes Biagio Borg, MD  predniSONE (DELTASONE) 20 MG tablet Take 1 tablet (20 mg total) by mouth 2 (two) times daily. 08/16/15  Yes Hendricks Limes, MD  triamterene-hydrochlorothiazide (DYAZIDE) 37.5-25 MG per capsule daily. 08/04/15  Yes Historical Provider, MD    No Known Allergies  Her family history includes Breast cancer in her mother; Dementia in her mother; Diabetes in her daughter; Heart disease in her father and other; Melanoma in her mother.  She  reports that she has never smoked. She has never used smokeless tobacco. She reports that she drinks about 1.2 oz of alcohol per week. She reports that she does not use illicit drugs.  Review of Systems  Constitutional: Positive for appetite change and unexpected weight change. Negative for fever.  HENT: Positive for trouble swallowing. Negative for congestion, dental problem, ear pain, nosebleeds, postnasal drip, rhinorrhea, sinus pressure, sneezing and sore throat.   Eyes: Negative for redness and itching.  Respiratory: Positive for cough, shortness of breath and wheezing. Negative for chest tightness.   Cardiovascular: Positive for chest pain. Negative for palpitations and leg swelling.  Gastrointestinal: Negative for nausea and vomiting.  Genitourinary:  Negative for dysuria.  Musculoskeletal: Negative for joint swelling.  Skin: Negative for rash.  Neurological: Negative for headaches.  Hematological: Does not bruise/bleed easily.  Psychiatric/Behavioral: Negative for dysphoric mood. The patient is not nervous/anxious.    Physical Exam: BP 108/70 mmHg  Pulse 103  Ht 5' 9.5" (1.765 m)  Wt 152 lb 3.2 oz (69.037 kg)  BMI 22.16 kg/m2  SpO2 93%  LMP  10/11/1979  General - No distress ENT - No sinus tenderness, no oral exudate, no LAN, no thyromegaly, TM clear, pupils equal/reactive Cardiac - s1s2 regular, no murmur, pulses symmetric Chest - decreased BS at rt base with crackles and dullness, no wheeze Back - No focal tenderness Abd - Soft, non-tender, no organomegaly, + bowel sounds Ext - No edema Neuro - Normal strength, cranial nerves intact Skin - No rashes Psych - Normal mood, and behavior   Dg Chest 2 View  08/16/2015   CLINICAL DATA:  Persistent cough and congestion ; shortness of Breath  EXAM: CHEST  2 VIEW  COMPARISON:  July 29, 2015  FINDINGS: Extensive airspace consolidation is noted in the right middle lobe region, progressed from prior study. 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 There is no new opacity on the left.  The heart size and pulmonary vascularity are normal. No adenopathy is appreciable. There is upper lumbar levoscoliosis. There are surgical clips in the right breast region.  IMPRESSION: Increased consolidation right middle lobe. Question small right pleural effusion. Extensive reticulonodular opacity in both mid and lower lung zones, stable. Question atypical pneumonia versus possible underlying neoplastic presentation. Correlation with chest CT, ideally with intravenous contrast, may well be advisable given the persistence of the reticulonodular interstitial disease and the progression of consolidation on the right.  These results will be called to the ordering clinician or representative by the Radiologist Assistant, and communication documented in the PACS or zVision Dashboard.   Electronically Signed   By: Lowella Grip III M.D.   On: 08/16/2015 10:59    Lab Results  Component Value Date   WBC 13.9* 08/16/2015   HGB 14.6 08/16/2015   HCT 42.9 08/16/2015   MCV 86.5 08/16/2015   PLT 345.0  08/16/2015    Lab Results  Component Value Date   CREATININE 0.8 08/06/2014   BUN 15 08/06/2014   NA 139 08/06/2014   K 4.4 08/06/2014   CL 105 08/06/2014   CO2 26 08/06/2014    Lab Results  Component Value Date   ALT 18 08/06/2014   AST 28 08/06/2014   ALKPHOS 93 08/06/2014   BILITOT 0.5 08/06/2014    Lab Results  Component Value Date   TSH 4.30 08/06/2014   Discussion: Her symptoms, clinical findings, and radiographic appearance are consistent with community acquired pneumonia possibly complicated by pleural effusion.  She has some clinical improvement with restarting high dose of levaquin.  If she has significant pleural effusion, then she might need to have more definitive intervention for pleural effusion.  Assessment/Plan:  Community acquired pneumonia. Plan: - continue levaquin 500 mg daily >> might need to extend therapy to 10 days - get CT chest with IV contrast to further assess Rt pleural space, and then decide if pleural fluid sampling or thoracic surgery assessment is required - might also need to consider bronchoscopy with lung tissue sampling, depending on CT chest findings - will tentatively schedule her for follow up in 1 week with chest  xray   Chesley Mires, MD  Pulmonary/Critical Care/Sleep Pager:  [phone]

## 2015-08-18 NOTE — Addendum Note (Signed)
Addended by: Mathis Dad on: 08/18/2015 10:35 AM   Modules accepted: Orders

## 2015-08-19 DIAGNOSIS — C3490 Malignant neoplasm of unspecified part of unspecified bronchus or lung: Secondary | ICD-10-CM

## 2015-08-19 HISTORY — DX: Malignant neoplasm of unspecified part of unspecified bronchus or lung: C34.90

## 2015-08-24 ENCOUNTER — Ambulatory Visit (HOSPITAL_COMMUNITY)
Admission: RE | Admit: 2015-08-24 | Discharge: 2015-08-24 | Disposition: A | Discharge status: Home / Self Care | Payer: Medicare Other | Source: Ambulatory Visit | Attending: Radiology | Admitting: Radiology

## 2015-08-24 ENCOUNTER — Ambulatory Visit (HOSPITAL_COMMUNITY)
Admission: RE | Admit: 2015-08-24 | Discharge: 2015-08-24 | Disposition: A | Discharge status: Home / Self Care | Payer: Medicare Other | Source: Ambulatory Visit | Attending: Pulmonary Disease | Admitting: Pulmonary Disease

## 2015-08-24 DIAGNOSIS — J910 Malignant pleural effusion: Secondary | ICD-10-CM | POA: Diagnosis not present

## 2015-08-24 DIAGNOSIS — J90 Pleural effusion, not elsewhere classified: Secondary | ICD-10-CM

## 2015-08-24 DIAGNOSIS — Z853 Personal history of malignant neoplasm of breast: Secondary | ICD-10-CM | POA: Insufficient documentation

## 2015-08-24 DIAGNOSIS — C384 Malignant neoplasm of pleura: Secondary | ICD-10-CM | POA: Diagnosis not present

## 2015-08-24 DIAGNOSIS — Z09 Encounter for follow-up examination after completed treatment for conditions other than malignant neoplasm: Secondary | ICD-10-CM | POA: Diagnosis not present

## 2015-08-24 DIAGNOSIS — C799 Secondary malignant neoplasm of unspecified site: Secondary | ICD-10-CM | POA: Insufficient documentation

## 2015-08-24 DIAGNOSIS — Z9889 Other specified postprocedural states: Secondary | ICD-10-CM

## 2015-08-24 LAB — GRAM STAIN

## 2015-08-24 LAB — BODY FLUID CELL COUNT WITH DIFFERENTIAL
Eos, Fluid: 2 %
LYMPHS FL: 78 %
MONOCYTE-MACROPHAGE-SEROUS FLUID: 12 % — AB (ref 50–90)
Neutrophil Count, Fluid: 8 % (ref 0–25)
Total Nucleated Cell Count, Fluid: 1930 cu mm — ABNORMAL HIGH (ref 0–1000)

## 2015-08-24 LAB — LACTATE DEHYDROGENASE, PLEURAL OR PERITONEAL FLUID: LD, Fluid: 371 U/L — ABNORMAL HIGH (ref 3–23)

## 2015-08-24 LAB — PROTEIN, BODY FLUID

## 2015-08-24 LAB — GLUCOSE, SEROUS FLUID: GLUCOSE FL: 107 mg/dL

## 2015-08-24 NOTE — Procedures (Signed)
Successful US guided right thoracentesis. Yielded 1.4 liters of serous/blood tinged fluid. Pt tolerated procedure well. No immediate complications.  Specimen was sent for labs. CXR ordered.  Tsosie Billing D PA-C 08/24/2015 12:07 PM

## 2015-08-25 ENCOUNTER — Encounter: Payer: Self-pay | Admitting: Adult Health

## 2015-08-25 ENCOUNTER — Ambulatory Visit (INDEPENDENT_AMBULATORY_CARE_PROVIDER_SITE_OTHER): Payer: Medicare Other | Admitting: Adult Health

## 2015-08-25 ENCOUNTER — Ambulatory Visit (INDEPENDENT_AMBULATORY_CARE_PROVIDER_SITE_OTHER)
Admission: RE | Admit: 2015-08-25 | Discharge: 2015-08-25 | Disposition: A | Discharge status: Home / Self Care | Payer: Medicare Other | Source: Ambulatory Visit | Attending: Adult Health | Admitting: Adult Health

## 2015-08-25 VITALS — BP 102/82 | HR 114 | Temp 97.8°F | Ht 69.0 in | Wt 148.0 lb

## 2015-08-25 DIAGNOSIS — R918 Other nonspecific abnormal finding of lung field: Secondary | ICD-10-CM | POA: Insufficient documentation

## 2015-08-25 DIAGNOSIS — R05 Cough: Secondary | ICD-10-CM | POA: Diagnosis not present

## 2015-08-25 DIAGNOSIS — J910 Malignant pleural effusion: Secondary | ICD-10-CM | POA: Insufficient documentation

## 2015-08-25 DIAGNOSIS — J948 Other specified pleural conditions: Secondary | ICD-10-CM | POA: Diagnosis not present

## 2015-08-25 DIAGNOSIS — J189 Pneumonia, unspecified organism: Secondary | ICD-10-CM

## 2015-08-25 DIAGNOSIS — R0602 Shortness of breath: Secondary | ICD-10-CM | POA: Diagnosis not present

## 2015-08-25 DIAGNOSIS — J90 Pleural effusion, not elsewhere classified: Secondary | ICD-10-CM

## 2015-08-25 NOTE — Progress Notes (Signed)
   Subjective:    Patient ID: Deborah [name], female    DOB: [birth year], 69 y.o.   MRN: [id number]  HPI 69 yo female never smoker seen for consult for PNA 08/18/15 by Dr. Halford Chessman   Right breast cancer s/p radiation and lumpectomy in 1999   08/25/2015 Follow up :CAP/lung nodules/mass/pleural effusion  Pt returns for 1 week follow up  Pt was seen for pulmonary consult last week for persistent right sided PNA despite prolonged antibiotics She was set up for a CT chest on 08/18/15 that showed innumerable pulmonary nodules and thoracic adenopathy suspcious for metastatic dz. Extensive lymphangtitic tumor spread throughout the right lung consolidation most in RML with pleural effusion on right.  She was set up for a thoracentesis yesterday with 1.4 pleural fluid removed that was blood tinged .  Cytology is not available at todays viist. She does feel slightly better w/ less dyspnea. Still has pain on inspiration on right and dyspnea. Remains weak.  She denies hemoptysis , fever , discolored mucus or orthopnea.  'cxr today shows slight increased in right pleural effusion and persistent consolidation .   Review of Systems  Constitutional:   No  weight loss, night sweats,  Fevers, chills,  +fatigue, or  lassitude.  HEENT:   No headaches,  Difficulty swallowing,  Tooth/dental problems, or  Sore throat,                No sneezing, itching, ear ache, nasal congestion, post nasal drip,   CV:  No chest pain,  Orthopnea, PND, swelling in lower extremities, anasarca, dizziness, palpitations, syncope.   GI  No heartburn, indigestion, abdominal pain, nausea, vomiting, diarrhea, change in bowel habits, loss of appetite, bloody stools.   Resp:  .  No chest wall deformity  Skin: no rash or lesions.  GU: no dysuria, change in color of urine, no urgency or frequency.  No flank pain, no hematuria   MS:  No joint pain or swelling.  No decreased range of motion.  No back pain.  Psych:  No change in mood or  affect. No depression or anxiety.  No memory loss.          Objective:   Physical Exam GEN: A/Ox3; pleasant , NAD, frail appearing   HEENT:  [place]/AT,  EACs-clear, TMs-wnl, NOSE-clear, THROAT-clear, no lesions, no postnasal drip or exudate noted.   NECK:  Supple w/ fair ROM; no JVD; normal carotid impulses w/o bruits; no thyromegaly or nodules palpated; no lymphadenopathy.  RESP  Decreased BS in right , .no accessory muscle use, no dullness to percussion  CARD:  RRR, no m/r/g  , no peripheral edema, pulses intact, no cyanosis or clubbing.  GI:   Soft & nt; nml bowel sounds; no organomegaly or masses detected.  Musco: Warm bil, no deformities or joint swelling noted.   Neuro: alert, no focal deficits noted.    Skin: Warm, no lesions or rashes         Assessment & Plan:

## 2015-08-25 NOTE — Assessment & Plan Note (Signed)
Right pleural effusion s/p thoracentesis w/ 1.4 L removed  Follow cytology closely

## 2015-08-25 NOTE — Assessment & Plan Note (Signed)
Persistent PNA unresponsive to ABX with  multiple lung nodules noted CT chest with effuson  suspcious for cancer . Effusion could be malignant will await cytology , if non diagnostic will need to proceed with Bronchoscopy.  Will call pt/husband when results are available

## 2015-08-25 NOTE — Progress Notes (Signed)
Reviewed and agree.  She has exudate effusion with predominance of lymphocytes.  This is consistent with concern for malignant effusion in setting of multiple pulmonary nodules and possible lymphangitic spread.  Further plan to be based on pleural fluid cytology results.

## 2015-08-25 NOTE — Patient Instructions (Signed)
I will be in touch with rest of labs when available .  May use Dels[facility] As needed  Cough .  follow up Dr. Halford Chessman  In 2-3 weeks and As needed   Please contact office for sooner follow up if symptoms do not improve or worsen or seek emergency care

## 2015-08-26 ENCOUNTER — Ambulatory Visit: Payer: Medicare Other | Admitting: Adult Health

## 2015-08-26 ENCOUNTER — Telehealth: Payer: Self-pay | Admitting: Pulmonary Disease

## 2015-08-26 ENCOUNTER — Encounter: Payer: Self-pay | Admitting: Pulmonary Disease

## 2015-08-26 DIAGNOSIS — C3490 Malignant neoplasm of unspecified part of unspecified bronchus or lung: Secondary | ICD-10-CM

## 2015-08-26 DIAGNOSIS — C3411 Malignant neoplasm of upper lobe, right bronchus or lung: Secondary | ICD-10-CM

## 2015-08-26 DIAGNOSIS — C3491 Malignant neoplasm of unspecified part of right bronchus or lung: Secondary | ICD-10-CM | POA: Insufficient documentation

## 2015-08-26 HISTORY — DX: Malignant neoplasm of upper lobe, right bronchus or lung: C34.11

## 2015-08-26 NOTE — Telephone Encounter (Signed)
Spoke with pt's husband about pleural fluid cytology results.  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  Will arrange for referral to multidisciplinary thoracic oncology clinic [facility]).  Will route to Dr. Jenny Reichmann to be aware of events.

## 2015-08-27 ENCOUNTER — Telehealth: Payer: Self-pay | Admitting: *Deleted

## 2015-08-27 ENCOUNTER — Encounter: Payer: Self-pay | Admitting: *Deleted

## 2015-08-27 DIAGNOSIS — C3490 Malignant neoplasm of unspecified part of unspecified bronchus or lung: Secondary | ICD-10-CM

## 2015-08-27 NOTE — Telephone Encounter (Signed)
Oncology Nurse Navigator Documentation  Oncology Nurse Navigator Flowsheets 08/27/2015  Referral date to RadOnc/MedOnc 08/26/2015  Navigator Encounter Type Introductory phone call/I received a referral late yesterday.  I called today to schedule.  Patient will be seen 08/30/15 arrive at cancer center at 11:30 with labs at 11:45.  Patient's husband verbalized understanding of appt time and place.  He was thankful for the quick appt.    Treatment Phase Abnormal Scans  Interventions Coordination of Care  Coordination of Care MD Appointments  Time Spent with Patient 15

## 2015-08-29 LAB — CULTURE, BODY FLUID W GRAM STAIN -BOTTLE: Culture: NO GROWTH

## 2015-08-29 LAB — CULTURE, BODY FLUID-BOTTLE

## 2015-08-30 ENCOUNTER — Telehealth: Payer: Self-pay | Admitting: Internal Medicine

## 2015-08-30 ENCOUNTER — Encounter: Payer: Self-pay | Admitting: Internal Medicine

## 2015-08-30 ENCOUNTER — Ambulatory Visit: Payer: Medicare Other

## 2015-08-30 ENCOUNTER — Other Ambulatory Visit (HOSPITAL_BASED_OUTPATIENT_CLINIC_OR_DEPARTMENT_OTHER): Payer: Medicare Other

## 2015-08-30 ENCOUNTER — Ambulatory Visit (HOSPITAL_BASED_OUTPATIENT_CLINIC_OR_DEPARTMENT_OTHER): Payer: Medicare Other | Admitting: Internal Medicine

## 2015-08-30 ENCOUNTER — Encounter: Payer: Self-pay | Admitting: *Deleted

## 2015-08-30 ENCOUNTER — Telehealth: Payer: Self-pay | Admitting: *Deleted

## 2015-08-30 VITALS — BP 128/89 | HR 125 | Temp 98.4°F | Resp 19 | Ht 69.0 in | Wt 146.4 lb

## 2015-08-30 DIAGNOSIS — J910 Malignant pleural effusion: Secondary | ICD-10-CM

## 2015-08-30 DIAGNOSIS — C3491 Malignant neoplasm of unspecified part of right bronchus or lung: Secondary | ICD-10-CM

## 2015-08-30 DIAGNOSIS — E039 Hypothyroidism, unspecified: Secondary | ICD-10-CM | POA: Diagnosis not present

## 2015-08-30 DIAGNOSIS — F419 Anxiety disorder, unspecified: Secondary | ICD-10-CM | POA: Diagnosis not present

## 2015-08-30 DIAGNOSIS — C3490 Malignant neoplasm of unspecified part of unspecified bronchus or lung: Secondary | ICD-10-CM

## 2015-08-30 LAB — COMPREHENSIVE METABOLIC PANEL (CC13)
ALT: 8 U/L (ref 0–55)
AST: 32 U/L (ref 5–34)
Albumin: 2.9 g/dL — ABNORMAL LOW (ref 3.5–5.0)
Alkaline Phosphatase: 132 U/L (ref 40–150)
Anion Gap: 9 mEq/L (ref 3–11)
BUN: 16.3 mg/dL (ref 7.0–26.0)
CHLORIDE: 101 meq/L (ref 98–109)
CO2: 28 meq/L (ref 22–29)
CREATININE: 0.9 mg/dL (ref 0.6–1.1)
Calcium: 9.9 mg/dL (ref 8.4–10.4)
EGFR: 66 mL/min/{1.73_m2} — ABNORMAL LOW (ref >90)
Glucose: 99 mg/dl (ref 70–140)
Potassium: 4.4 mEq/L (ref 3.5–5.1)
Sodium: 138 mEq/L (ref 136–145)
Total Bilirubin: 0.44 mg/dL (ref 0.20–1.20)
Total Protein: 6.6 g/dL (ref 6.4–8.3)

## 2015-08-30 LAB — CBC WITH DIFFERENTIAL/PLATELET
BASO%: 0.5 % (ref 0.0–2.0)
Basophils Absolute: 0.1 10*3/uL (ref 0.0–0.1)
EOS%: 3.9 % (ref 0.0–7.0)
Eosinophils Absolute: 0.6 10*3/uL — ABNORMAL HIGH (ref 0.0–0.5)
HEMATOCRIT: 43.9 % (ref 34.8–46.6)
HGB: 14.9 g/dL (ref 11.6–15.9)
LYMPH%: 26.8 % (ref 14.0–49.7)
MCH: 29.1 pg (ref 25.1–34.0)
MCHC: 33.9 g/dL (ref 31.5–36.0)
MCV: 85.7 fL (ref 79.5–101.0)
MONO#: 1.3 10*3/uL — AB (ref 0.1–0.9)
MONO%: 8.3 % (ref 0.0–14.0)
NEUT%: 60.5 % (ref 38.4–76.8)
NEUTROS ABS: 9.4 10*3/uL — AB (ref 1.5–6.5)
Platelets: 389 10*3/uL (ref 145–400)
RBC: 5.12 10*6/uL (ref 3.70–5.45)
RDW: 12.3 % (ref 11.2–14.5)
WBC: 15.5 10*3/uL — AB (ref 3.9–10.3)
lymph#: 4.2 10*3/uL — ABNORMAL HIGH (ref 0.9–3.3)
nRBC: 0 % (ref 0–0)

## 2015-08-30 LAB — TECHNOLOGIST REVIEW

## 2015-08-30 MED ORDER — LORAZEPAM 0.5 MG PO TABS
0.5000 mg | ORAL_TABLET | Freq: Three times a day (TID) | ORAL | Status: DC
Start: 2015-08-30 — End: 2015-09-29

## 2015-08-30 NOTE — Progress Notes (Signed)
[place] [phone]   [phone]  CONSULT NOTE  REFERRING PHYSICIAN:Dr. Vineet Sood  REASON FOR CONSULTATION:   69 years old white female recently diagnosed with lung cancer.  HPI Deborah [name] is a 69 y.o. female a never smoker with past medical history significant for hypothyroidism, diverticulosis as well as history of breast cancer more than 20 years ago status post lumpectomy followed by radiotherapy and no requirement for chemotherapy or hormonal therapy. She has no evidence of recurrence of her breast cancer over the last 20 years and she has mammogram at regular basis with no evidence of recurrence.  In early August 2016 the patient was not feeling well and started having cough as well as shortness of breath.  She was seen by her primary care physician Dr. [name].  Chest x-ray was performed on 07/29/2015 and it showed reticulonodular interstitial prominence in the bilateral lower lobe right greater than left suspicious for pneumonia or atypical infection. The patient was treated with a course of antibiotics for around 10 days and repeat chest x-ray on 08/16/2015 showed increased consolidation of the right middle lobe with a small right pleural effusion.  There was extensive reticulonodular opacity in both mid and lower lung zones questionable for atypical pneumonia versus underlying neoplasm presentation. CT scan of the chest with contrast was performed on 8/31 2016 by Dr. Halford Chessman.  It showed masslike component is within the right upper lobe and measures 2.4 x 1.2 cm.  There was also innumerable bilateral and lower lobe predominant pulmonary nodules. Example subpleural left lower lobe at 8 mm and 6 mm right lower lobe pulmonary nodule.  The scan also showed Mediastinal adenopathy. Index right paratracheal node at 1.4 cm, subcarinal node measures 1.7 cm. Right hilar adenopathy, including at 1.6 x 1.7 cm.  There was also small to moderate  right-sided pleural effusion.  On 08/24/2015 the patient underwent ultrasound-guided right-sided thoracentesis yielding 1.4 L of pleural fluid. The final cytology (Accession: [id number]) showed malignant cells consistent with metastatic adenocarcinoma. Immunohistochemistry reveals the cells are positive for cytokeratin 7, NapsinA, and TTF-1. They are negative for cytokeratin 20, GCDFP, ER and PR. This profile is consistent with metastatic adenocarcinoma of lung origin. Dr. Halford Chessman kindly referred the patient to me today for further evaluation and recommendation regarding her recently diagnosed lung cancer.  when seen today the patient continues to complain of soreness and dull aching pain on the right side of the chest. This has been worse in the past and presenting with sharp pain. She continues to have shortness of breath and cough productive of thick mucus but no hemoptysis. She lost around 7 pounds in the last few weeks. She also complains of night sweats. She has occasional headache but no significant visual changes. She has no nausea or vomiting but has few episodes of diarrhea in addition to lack of appetite.  family history significant for mother with melanoma and breast cancer, father had heart disease and brother also had heart disease.  The patient is married and has 3 children. She was accompanied by her husband, Deborah [name].  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  She is currently retired and taking care of her grandkids. The patient has no history of smoking and drinks alcohol occasionally and no history of drug abuse.  HPI  Past Medical History  Diagnosis Date  . Allergy   . Jaundice   . Hyperlipidemia 08/04/2011  .  Hypothyroidism 08/04/2011  . Atrophic vaginitis   . 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 Basal cell carcinoma of skin 2010  . Osteopenia 10/2012    T score -1.7 FRAX 7%/0.3%    . Vertigo   . Non-small cell lung cancer September 2016    Adenocarcinoma    Past Surgical History  Procedure Laterality Date  . Tonsillectomy  1952  . Vaginal hysterectomy  1980  . Breast biopsy  1999    RADIATION TREATMENTS  . Breast lumpectomy  1998    right    Family History  Problem Relation Age of Onset  . Dementia Mother   . Breast cancer Mother     Breast Cancer  . Heart disease Other     Grandparents  . Heart disease Father   . Diabetes Daughter     TYPE 2  . Melanoma Mother     Social History Social History  Substance Use Topics  . Smoking status: Never Smoker   . Smokeless tobacco: Never Used  . Alcohol Use: 1.2 oz/week    2 Glasses of wine per week     Comment: 1 glass wine per wk at times    No Known Allergies  Current Outpatient Prescriptions  Medication Sig Dispense Refill  . Cholecalciferol (VITAMIN D-3) 1000 UNITS CAPS Take by mouth daily.    Marland Kitchen levothyroxine (SYNTHROID, LEVOTHROID) 75 MCG tablet TAKE 1 TABLET BY MOUTH DAILY. 90 tablet 3  . triamterene-hydrochlorothiazide (DYAZIDE) 37.5-25 MG per capsule Take 1 capsule by mouth daily.     Marland Kitchen albuterol (PROVENTIL HFA;VENTOLIN HFA) 108 (90 BASE) MCG/ACT inhaler Inhale 2 puffs into the lungs every 6 (six) hours as needed for wheezing or shortness of breath. (Patient not taking: Reported on 08/30/2015) 1 Inhaler 1  . LORazepam (ATIVAN) 0.5 MG tablet Take 1 tablet (0.5 mg total) by mouth every 8 (eight) hours. 30 tablet 0   No current facility-administered medications for this visit.    Review of Systems  Constitutional: positive for anorexia, fatigue, night sweats and weight loss Eyes: negative Ears, nose, mouth, throat, and face: negative Respiratory: positive for cough, dyspnea on exertion and sputum Cardiovascular: negative Gastrointestinal: positive for diarrhea Genitourinary:negative Integument/breast: negative Hematologic/lymphatic: negative Musculoskeletal:negative Neurological:  negative Behavioral/Psych: negative Endocrine: negative Allergic/Immunologic: negative  Physical Exam  BLT:JQZES, healthy, no distress, well nourished, well developed and anxious SKIN: skin color, texture, turgor are normal, no rashes or significant lesions HEAD: Normocephalic, No masses, lesions, tenderness or abnormalities EYES: normal, PERRLA, Conjunctiva are pink and non-injected EARS: External ears normal, Canals clear OROPHARYNX:no exudate, no erythema and lips, buccal mucosa, and tongue normal  NECK: supple, no adenopathy, no JVD LYMPH:  no palpable lymphadenopathy, no hepatosplenomegaly BREAST:not examined LUNGS: decreased breath sounds, scattered rales bilaterally HEART: regular rate & rhythm, no murmurs and no gallops ABDOMEN:abdomen soft, non-tender, normal bowel sounds and no masses or organomegaly BACK: Back symmetric, no curvature., No CVA tenderness EXTREMITIES:no joint deformities, effusion, or inflammation, no edema, no skin discoloration  NEURO: alert & oriented x 3 with fluent speech, no focal motor/sensory deficits  PERFORMANCE STATUS: ECOG 1  LABORATORY DATA: Lab Results  Component Value Date   WBC 15.5* 08/30/2015   HGB 14.9 08/30/2015   HCT 43.9 08/30/2015   MCV 85.7 08/30/2015   PLT 389 08/30/2015      Chemistry      Component Value Date/Time   NA 138 08/30/2015 1129   NA  139 08/18/2015 1051   K 4.4 08/30/2015 1129   K 4.7 08/18/2015 1051   CL 103 08/18/2015 1051   CO2 28 08/30/2015 1129   CO2 27 08/18/2015 1051   BUN 16.3 08/30/2015 1129   BUN 21 08/18/2015 1051   CREATININE 0.9 08/30/2015 1129   CREATININE 0.86 08/18/2015 1051      Component Value Date/Time   CALCIUM 9.9 08/30/2015 1129   CALCIUM 9.9 08/18/2015 1051   ALKPHOS 132 08/30/2015 1129   ALKPHOS 93 08/06/2014 1635   AST 32 08/30/2015 1129   AST 28 08/06/2014 1635   ALT 8 08/30/2015 1129   ALT 18 08/06/2014 1635   BILITOT 0.44 08/30/2015 1129   BILITOT 0.5 08/06/2014 1635         RADIOGRAPHIC STUDIES: Dg Chest 1 View  08/24/2015   CLINICAL DATA:  Post thoracentesis  EXAM: CHEST  1 VIEW  COMPARISON:  Chest x-ray of 08/16/2015 and CT chest of 08/18/2015  FINDINGS: Some of the right pleural effusion has been evacuated after right thoracentesis. No pneumothorax is seen. Abnormal opacity remains at the right lung base representing infiltrate, atelectasis, or possibly tumor. The multiple pulmonary nodules noted throughout the lungs on CT of the chest are not as well appreciated by chest x-ray, consistent with diffuse pulmonary metastases. Heart size is stable.  IMPRESSION: Some decrease in the volume of the right pleural effusion after right thoracentesis. No pneumothorax.   Electronically Signed   By: Ivar Drape M.D.   On: 08/24/2015 12:28   Dg Chest 2 View  08/25/2015   CLINICAL DATA:  Follow-up right pleural effusion following thoracentesis yesterday, persistent cough and shortness of breath.  EXAM: CHEST  2 VIEW  COMPARISON:  Chest x-ray of August 24, 2015 performed immediately following right thoracentesis.  FINDINGS: The lungs are well-expanded. A slight increase in the volume of pleural fluid on the right has developed. There are assistant areas of increased density in the right infrahilar region. There are coarse nodular densities in the lower lobe on the left which are stable. The cardiac silhouette is top-normal in size. The pulmonary vascularity is not engorged. The mediastinum is normal in width. There is mild degenerative disc disease of the thoracic spine.  IMPRESSION: There has been slight interval increase in the volume of the right-sided pleural effusion which would still be considered small. There is persistent right middle lobe atelectasis, and there are stable coarse nodular lung markings at both bases.   Electronically Signed   By: David  Martinique M.D.   On: 08/25/2015 11:02   Dg Chest 2 View  08/16/2015   CLINICAL DATA:  Persistent cough and congestion ;  shortness of Breath  EXAM: CHEST  2 VIEW  COMPARISON:  July 29, 2015  FINDINGS: Extensive airspace consolidation is noted in the right middle lobe region, progressed from prior study. 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 There is no new opacity on the left.  The heart size and pulmonary vascularity are normal. No adenopathy is appreciable. There is upper lumbar levoscoliosis. There are surgical clips in the right breast region.  IMPRESSION: Increased consolidation right middle lobe. Question small right pleural effusion. Extensive reticulonodular opacity in both mid and lower lung zones, stable. Question atypical pneumonia versus possible underlying neoplastic presentation. Correlation with chest CT, ideally with intravenous contrast, may well be advisable given the persistence of the reticulonodular interstitial disease and the progression of consolidation  on the right.  These results will be called to the ordering clinician or representative by the Radiologist Assistant, and communication documented in the PACS or zVision Dashboard.   Electronically Signed   By: Lowella Grip III M.D.   On: 08/16/2015 10:59   Ct Chest W Contrast  08/18/2015   CLINICAL DATA:  Treated for pneumonia August 10th. Persistent antibiotics. Evaluate right pleural effusion. Prior lumpectomy in 1998.  EXAM: CT CHEST WITH CONTRAST  TECHNIQUE: Multidetector CT imaging of the chest was performed during intravenous contrast administration.  CONTRAST:  60mL OMNIPAQUE IOHEXOL 300 MG/ML  SOLN  COMPARISON:  Plain film of 08/16/2015 and 10/2015.  No prior CT.  FINDINGS: Mediastinum/Nodes: Left low jugular/supraclavicular adenopathy is mild on image 6 com 1.0 cm. No axillary adenopathy. Tortuous descending thoracic aorta. Mild cardiomegaly, with trace pericardial fluid or thickening. No central pulmonary embolism, on this  non-dedicated study.  Mediastinal adenopathy. Index right paratracheal node at 1.4 cm on image 17.  Subcarinal node measures 1.7 cm on image 29 of series 2.  Right hilar adenopathy, including at 1.6 x 1.7 cm on image 28.  Lungs/Pleura: Small to the moderate right-sided pleural effusion. No well-defined pleural mass.  Patent airways, with mass effect upon right lower and right middle lobe endobronchial tree.  Right lower lobe volume loss. Right-sided peribronchovascular interstitial thickening. The most masslike component is within the right upper lobe and measures 2.4 x 1.2 cm on image 23 of series 3. There is right-sided interlobular septal thickening and irregularity. Right middle lobe consolidation, including on image 40 of series 3.  Innumerable bilateral and lower lobe predominant pulmonary nodules. Example subpleural left lower lobe at 8 mm on image 44 of series 3. 6 mm right lower lobe pulmonary nodule on image 35 of series 3.  Upper abdomen: Normal imaged portions of the liver, spleen, stomach, pancreas, adrenal glands, kidneys.  Musculoskeletal: No acute osseous abnormality.  IMPRESSION: 1. Innumerable pulmonary nodules and thoracic adenopathy, most consistent with metastatic disease. Favor breast cancer metastasis. Metastatic disease from a second primary (i.e. lung) could look similar. 2. 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 Areas of consolidation, most apparent in the right middle lobe, could be neoplastic (metastatic disease) or less like represent concurrent infection. 3. Right pleural effusion. 4. Mild left low jugular/supraclavicular adenopathy, suspicious for metastatic disease. These results will be called to the ordering clinician or representative by the Radiologist Assistant, and communication documented in the PACS or zVision Dashboard.   Electronically Signed   By: Abigail Miyamoto M.D.   On: 08/18/2015 14:58   US Thoracentesis Asp Pleural Space W/img  Guide  08/24/2015   INDICATION: Symptomatic right sided pleural effusion  EXAM: US THORACENTESIS ASP PLEURAL SPACE W/IMG GUIDE  COMPARISON:  CT Chest 08/18/2015.  MEDICATIONS: None  COMPLICATIONS: None immediate  TECHNIQUE: Informed written consent was obtained from the patient after a discussion of the risks, benefits and alternatives to treatment. A timeout was performed prior to the initiation of the procedure.  Initial ultrasound scanning demonstrates a right pleural effusion. The lower chest was prepped and draped in the usual sterile fashion. 1% lidocaine was used for local anesthesia.  An ultrasound image was saved for documentation purposes. A 6 Fr Safe-T-Centesis catheter was introduced. The thoracentesis was performed. The catheter was removed and a dressing was applied. The patient tolerated the procedure well without immediate post procedural complication. The patient was escorted to have an upright chest radiograph.  FINDINGS: A total of  approximately 1.4 liters of serous/blood tinged fluid was removed. Requested samples were sent to the laboratory.  IMPRESSION: Successful ultrasound-guided right sided thoracentesis yielding 1.4 liters of pleural fluid.  Read By:  Tsosie Billing PA-C   Electronically Signed   By: Aletta Edouard M.D.   On: 08/24/2015 13:00    ASSESSMENT: this is a very pleasant 69 years old never smoker white female recently diagnosed  In September 2016with stage IV (T1b, N2, M1 A) non-small cell lung cancer, adenocarcinoma presenting with right upper lobe lung mass in addition to mediastinal lymphadenopathy and malignant pleural effusion as well as innumerable bilateral pulmonary nodules.   PLAN: I had a lengthy discussion with the patient and her husband today about her current disease stage, prognosis and treatment options.  I recommended for the patient to complete the staging workup by ordering a PET scan in addition to MRI of the brain to rule out any other metastatic  disease.  The patient is a never smoker and I requested blood sample today for EGFR mutation and I also request the pathology department to send [facility] tissue block [facility] for biomarker studies if there is sufficient tissue.  If there is  Insufficient material for testing, I would consider sending blood tests to [facility] for the molecular studies.  If the patient has a target mutation, she would be treated with started therapy otherwise she may be considered for systemic chemotherapy with carboplatin and Alimta versus palliative care and hospice referral. I briefly discussed with the patient the systemic chemotherapy and adverse effects.  She is very interested in treatment.  I will arrange for her to come back for follow-up visit in one week for reevaluation and more detailed discussion of her treatment options based on the molecular studies and the staging workup. For anxiety, I started the patient on Ativan 0.5 mg by mouth every 8 hours as needed. For the right-sided malignant pleural effusion, I referred the patient to thoracic surgery for consideration of Pleurx catheter placement and Talc pleurodesis if needed. I gave the patient and her husband the time to ask questions and I answered them completely to their satisfaction.  The patient was advised to call immediately if she has any concerning symptoms in the interval. The patient voices understanding of current disease status and treatment options and is in agreement with the current care plan.  All questions were answered. The patient knows to call the clinic with any problems, questions or concerns. We can certainly see the patient much sooner if necessary.  Thank you so much for allowing me to participate in the care of Lezlie Lye. I will continue to follow up the patient with you and assist in her care.  I spent 50 minutes counseling the patient face to face. The total time spent in the appointment was 80  minutes.  Disclaimer: This note was dictated with voice recognition software. Similar sounding words can inadvertently be transcribed and may not be corrected upon review.   Narmeen Kerper K. August 30, 2015, 12:49 PM

## 2015-08-30 NOTE — Telephone Encounter (Signed)
Gave adn prnted appt sched and avs for pt for Sept

## 2015-08-30 NOTE — Telephone Encounter (Signed)
Oncology Nurse Navigator Documentation  Oncology Nurse Navigator Flowsheets 08/30/2015  Navigator Encounter Type Telephone/per Dr. Julien Nordmann I called [facility] for an appt for patient to be seen and evaluated for pleur x cath placement.  Patient is scheduled for 08/31/15 arrive at 12:45 at [place] office.  I left this information on vm and my name and phone number to call if needed  Patient Visit Type Follow-up  Treatment Phase Other  Interventions Coordination of Care  Coordination of Care MD Appointments  Time Spent with Patient 15

## 2015-08-30 NOTE — Progress Notes (Signed)
Per Dr. Julien Nordmann, I notified pathology to send cytology to [facility].  I asked that they notify me if there is not enough tissue.

## 2015-08-31 ENCOUNTER — Encounter: Payer: Self-pay | Admitting: Thoracic Surgery (Cardiothoracic Vascular Surgery)

## 2015-08-31 ENCOUNTER — Institutional Professional Consult (permissible substitution) (INDEPENDENT_AMBULATORY_CARE_PROVIDER_SITE_OTHER): Payer: Medicare Other | Admitting: Thoracic Surgery (Cardiothoracic Vascular Surgery)

## 2015-08-31 VITALS — BP 120/80 | HR 120 | Resp 20 | Ht 69.0 in | Wt 146.0 lb

## 2015-08-31 DIAGNOSIS — J90 Pleural effusion, not elsewhere classified: Secondary | ICD-10-CM

## 2015-08-31 DIAGNOSIS — J948 Other specified pleural conditions: Secondary | ICD-10-CM

## 2015-08-31 NOTE — Progress Notes (Signed)
PCP is Deborah Cower, MD Referring Provider is Deborah Bears, MD  Chief Complaint  Patient presents with  . Pleural Effusion    surgical eval for PleurX placement    HPI: 69 year old woman with a malignant right pleural effusion sent for consideration of pleural catheter placement.  [name] is a 69 year old woman with a history of breast cancer who was in her usual state of health until early August when she started noticing cough, wheezing, shortness of breath with exertion, orthopnea, and general malaise. She saw Dr. Jenny Reichmann. A chest x-ray suggested pneumonia. She was started on anti-biotics, but did not improve. A repeat chest x-ray showed progression in the right base with a pleural effusion. That subsequently led to a CT of the chest which showed a right upper lobe mass, mediastinal adenopathy, multiple additional lung nodules, and a right pleural effusion. A thoracentesis was done on 08/24/2015. 1.4 L of fluid was drained. She did improve symptomatically after the fluid was drained. She says her breathing is been stable since the day after the thoracentesis.  Zubrod Score: At the time of surgery this patient's most appropriate activity status/level should be described as: '[]'$     0    Normal activity, no symptoms '[x]'$     1    Restricted in physical strenuous activity but ambulatory, able to do out light work '[]'$     2    Ambulatory and capable of self care, unable to do work activities, up and about >50 % of waking hours                              '[]'$     3    Only limited self care, in bed greater than 50% of waking hours '[]'$     4    Completely disabled, no self care, confined to bed or chair '[]'$     5    Moribund      Past Medical History  Diagnosis Date  . Allergy   . Jaundice   . Hyperlipidemia 08/04/2011  . Hypothyroidism 08/04/2011  . Atrophic vaginitis   . 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 Basal cell carcinoma of skin 2010   . Osteopenia 10/2012    T score -1.7 FRAX 7%/0.3%  . Vertigo   . Non-small cell lung cancer September 2016    Adenocarcinoma    Past Surgical History  Procedure Laterality Date  . Tonsillectomy  1952  . Vaginal hysterectomy  1980  . Breast biopsy  1999    RADIATION TREATMENTS  . Breast lumpectomy  1998    right    Family History  Problem Relation Age of Onset  . Dementia Mother   . Breast cancer Mother     Breast Cancer  . Heart disease Other     Grandparents  . Heart disease Father   . Diabetes Daughter     TYPE 2  . Melanoma Mother     Social History Social History  Substance Use Topics  . Smoking status: Never Smoker   . Smokeless tobacco: Never Used  . Alcohol Use: 1.2 oz/week    2 Glasses of wine per week     Comment: 1 glass wine per wk at times    Current Outpatient Prescriptions  Medication Sig Dispense Refill  . albuterol (PROVENTIL HFA;VENTOLIN HFA) 108 (90 BASE) MCG/ACT inhaler Inhale 2 puffs into the  lungs every 6 (six) hours as needed for wheezing or shortness of breath. 1 Inhaler 1  . Cholecalciferol (VITAMIN D-3) 1000 UNITS CAPS Take by mouth daily.    Marland Kitchen levothyroxine (SYNTHROID, LEVOTHROID) 75 MCG tablet TAKE 1 TABLET BY MOUTH DAILY. 90 tablet 3  . LORazepam (ATIVAN) 0.5 MG tablet Take 1 tablet (0.5 mg total) by mouth every 8 (eight) hours. 30 tablet 0  . triamterene-hydrochlorothiazide (DYAZIDE) 37.5-25 MG per capsule Take 1 capsule by mouth daily.      No current facility-administered medications for this visit.    No Known Allergies  Review of Systems  Constitutional: Positive for fever, diaphoresis, activity change, appetite change, fatigue and unexpected weight change (lost 11 pounds in 3 months).  HENT: Positive for congestion.   Eyes: Negative for visual disturbance.  Respiratory: Positive for cough, chest tightness, shortness of breath and wheezing.   Cardiovascular: Negative for chest pain and leg swelling.  Gastrointestinal:  Positive for diarrhea.  Neurological: Negative for syncope and weakness.  Hematological: Negative for adenopathy. Does not bruise/bleed easily.  All other systems reviewed and are negative.   BP 120/80 mmHg  Pulse 120  Resp 20  Ht '5\' 9"'$  (1.753 m)  Wt 146 lb (66.225 kg)  BMI 21.55 kg/m2  SpO2 95%  LMP 10/11/1979 Physical Exam  Constitutional: She is oriented to person, place, and time. She appears well-developed and well-nourished. No distress.  HENT:  Head: Normocephalic and atraumatic.  Eyes: Conjunctivae and EOM are normal. No scleral icterus.  Neck: Neck supple. No thyromegaly present.  Pulmonary/Chest: Effort normal. She has no wheezes.  Diminished BS and dullness to percussion right base  Abdominal: Soft. Bowel sounds are normal. She exhibits no distension. There is no tenderness.  Musculoskeletal: She exhibits no edema.  Lymphadenopathy:    She has no cervical adenopathy.  Neurological: She is alert and oriented to person, place, and time. No cranial nerve deficit. She exhibits normal muscle tone.  Skin: Skin is warm and dry.  Vitals reviewed.    Diagnostic Tests: CT CHEST WITH CONTRAST  TECHNIQUE: Multidetector CT imaging of the chest was performed during intravenous contrast administration.  CONTRAST: 16m OMNIPAQUE IOHEXOL 300 MG/ML SOLN  COMPARISON: Plain film of 08/16/2015 and 10/2015. No prior CT.  FINDINGS: Mediastinum/Nodes: Left low jugular/supraclavicular adenopathy is mild on image 6 com 1.0 cm. No axillary adenopathy. Tortuous descending thoracic aorta. Mild cardiomegaly, with trace pericardial fluid or thickening. No central pulmonary embolism, on this non-dedicated study.  Mediastinal adenopathy. Index right paratracheal node at 1.4 cm on image 17.  Subcarinal node measures 1.7 cm on image 29 of series 2.  Right hilar adenopathy, including at 1.6 x 1.7 cm on image 28.  Lungs/Pleura: Small to the moderate right-sided pleural  effusion. No well-defined pleural mass.  Patent airways, with mass effect upon right lower and right middle lobe endobronchial tree.  Right lower lobe volume loss. Right-sided peribronchovascular interstitial thickening. The most masslike component is within the right upper lobe and measures 2.4 x 1.2 cm on image 23 of series 3. There is right-sided interlobular septal thickening and irregularity. Right middle lobe consolidation, including on image 40 of series 3.  Innumerable bilateral and lower lobe predominant pulmonary nodules. Example subpleural left lower lobe at 8 mm on image 44 of series 3. 6 mm right lower lobe pulmonary nodule on image 35 of series 3.  Upper abdomen: Normal imaged portions of the liver, spleen, stomach, pancreas, adrenal glands, kidneys.  Musculoskeletal: No acute osseous abnormality.  IMPRESSION: 1. Innumerable pulmonary nodules and thoracic adenopathy, most consistent with metastatic disease. Favor breast cancer metastasis. Metastatic disease from a second primary (i.e. lung) could look similar. 2. 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 Areas of consolidation, most apparent in the right middle lobe, could be neoplastic (metastatic disease) or less like represent concurrent infection. 3. Right pleural effusion. 4. Mild left low jugular/supraclavicular adenopathy, suspicious for metastatic disease. These results will be called to the ordering clinician or representative by the Radiologist Assistant, and communication documented in the PACS or zVision Dashboard.   Electronically Signed  By: Abigail Miyamoto M.D.  On: 08/18/2015 14:58  I personally reviewed the CT scan as well as multiple chest x-rays and concur with the findings as noted above. Her most recent chest x-ray was 08/25/2015 which showed slight reaccumulation of her effusion.  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 She had thoracentesis about a week ago which drained 1.4 L of fluid. She did improve symptomatically after the drainage. Her symptoms have not really recurred at this point, but it is relatively early, and the most likely scenario is that the effusion will reaccumulate and become symptomatic once again.  I discussed the possibility of placing a right pleural catheter for chronic management of her malignant pleural effusion. I discussed the general nature of the procedure and the care of the catheter. She her husband understand this would be done on an outpatient basis. We would arrange for a home health nurse for initial drainages and teaching. I discussed the indications, risks, benefits, and alternatives. They understand the risks include, but are not limited to bleeding, pneumothorax, catheter malposition, catheter occlusion, infection, as well as the possibility of other unforeseeable complications.  I did tell him that we typically wait to see if the effusion reaccumulates before proceeding with catheter placement.  She is having a PET/CT on Thursday. That should give [place] a good indicator of the radial reaccumulation.  I provided them with an informational packet for Pleurx catheters. They will review that information and then let us know what they decide to do.  Plan: Right pleural catheter placement under local with intravenous sedation. The patient will call to schedule if she decides to proceed.  Melrose Nakayama, MD [facility] [phone]

## 2015-09-01 ENCOUNTER — Ambulatory Visit (HOSPITAL_COMMUNITY)
Admission: RE | Admit: 2015-09-01 | Discharge: 2015-09-01 | Disposition: A | Discharge status: Home / Self Care | Payer: Medicare Other | Source: Ambulatory Visit | Attending: Internal Medicine | Admitting: Internal Medicine

## 2015-09-01 DIAGNOSIS — C3490 Malignant neoplasm of unspecified part of unspecified bronchus or lung: Secondary | ICD-10-CM | POA: Insufficient documentation

## 2015-09-01 DIAGNOSIS — C782 Secondary malignant neoplasm of pleura: Secondary | ICD-10-CM | POA: Insufficient documentation

## 2015-09-01 DIAGNOSIS — J90 Pleural effusion, not elsewhere classified: Secondary | ICD-10-CM | POA: Diagnosis not present

## 2015-09-01 DIAGNOSIS — C3491 Malignant neoplasm of unspecified part of right bronchus or lung: Secondary | ICD-10-CM

## 2015-09-01 DIAGNOSIS — R918 Other nonspecific abnormal finding of lung field: Secondary | ICD-10-CM | POA: Insufficient documentation

## 2015-09-01 DIAGNOSIS — C778 Secondary and unspecified malignant neoplasm of lymph nodes of multiple regions: Secondary | ICD-10-CM | POA: Insufficient documentation

## 2015-09-01 DIAGNOSIS — C7801 Secondary malignant neoplasm of right lung: Secondary | ICD-10-CM | POA: Diagnosis not present

## 2015-09-01 LAB — GLUCOSE, CAPILLARY: GLUCOSE-CAPILLARY: 120 mg/dL — AB (ref 65–99)

## 2015-09-01 MED ORDER — FLUDEOXYGLUCOSE F - 18 (FDG) INJECTION: 7.2000 | Freq: Once | INTRAVENOUS | Status: DC | PRN

## 2015-09-08 ENCOUNTER — Ambulatory Visit (HOSPITAL_COMMUNITY)
Admission: RE | Admit: 2015-09-08 | Discharge: 2015-09-08 | Disposition: A | Discharge status: Home / Self Care | Payer: Medicare Other | Source: Ambulatory Visit | Attending: Internal Medicine | Admitting: Internal Medicine

## 2015-09-08 DIAGNOSIS — C3491 Malignant neoplasm of unspecified part of right bronchus or lung: Secondary | ICD-10-CM | POA: Insufficient documentation

## 2015-09-08 MED ORDER — GADOBENATE DIMEGLUMINE 529 MG/ML IV SOLN
15.0000 mL | Freq: Once | INTRAVENOUS | Status: AC | PRN
  Administered 2015-09-08: 13 mL via INTRAVENOUS

## 2015-09-10 ENCOUNTER — Telehealth: Payer: Self-pay | Admitting: Internal Medicine

## 2015-09-10 ENCOUNTER — Encounter: Payer: Self-pay | Admitting: *Deleted

## 2015-09-10 ENCOUNTER — Other Ambulatory Visit: Payer: Self-pay | Admitting: *Deleted

## 2015-09-10 ENCOUNTER — Encounter: Payer: Self-pay | Admitting: Internal Medicine

## 2015-09-10 ENCOUNTER — Other Ambulatory Visit (HOSPITAL_BASED_OUTPATIENT_CLINIC_OR_DEPARTMENT_OTHER): Payer: Medicare Other

## 2015-09-10 ENCOUNTER — Ambulatory Visit (HOSPITAL_BASED_OUTPATIENT_CLINIC_OR_DEPARTMENT_OTHER): Payer: Medicare Other | Admitting: Internal Medicine

## 2015-09-10 VITALS — BP 132/90 | HR 115 | Temp 97.9°F | Resp 17 | Ht 69.0 in | Wt 149.0 lb

## 2015-09-10 DIAGNOSIS — C3491 Malignant neoplasm of unspecified part of right bronchus or lung: Secondary | ICD-10-CM

## 2015-09-10 DIAGNOSIS — J90 Pleural effusion, not elsewhere classified: Secondary | ICD-10-CM

## 2015-09-10 LAB — CBC WITH DIFFERENTIAL/PLATELET
BASO%: 0.3 % (ref 0.0–2.0)
Basophils Absolute: 0 10*3/uL (ref 0.0–0.1)
EOS%: 3.2 % (ref 0.0–7.0)
Eosinophils Absolute: 0.3 10*3/uL (ref 0.0–0.5)
HCT: 42.5 % (ref 34.8–46.6)
HGB: 14.1 g/dL (ref 11.6–15.9)
LYMPH%: 34.7 % (ref 14.0–49.7)
MCH: 28.4 pg (ref 25.1–34.0)
MCHC: 33.1 g/dL (ref 31.5–36.0)
MCV: 85.7 fL (ref 79.5–101.0)
MONO#: 0.9 10*3/uL (ref 0.1–0.9)
MONO%: 10.5 % (ref 0.0–14.0)
NEUT%: 51.3 % (ref 38.4–76.8)
NEUTROS ABS: 4.5 10*3/uL (ref 1.5–6.5)
Platelets: 298 10*3/uL (ref 145–400)
RBC: 4.96 10*6/uL (ref 3.70–5.45)
RDW: 13.3 % (ref 11.2–14.5)
WBC: 8.7 10*3/uL (ref 3.9–10.3)
lymph#: 3 10*3/uL (ref 0.9–3.3)

## 2015-09-10 LAB — COMPREHENSIVE METABOLIC PANEL (CC13)
ALT: 10 U/L (ref 0–55)
AST: 37 U/L — ABNORMAL HIGH (ref 5–34)
Albumin: 3.3 g/dL — ABNORMAL LOW (ref 3.5–5.0)
Alkaline Phosphatase: 111 U/L (ref 40–150)
Anion Gap: 8 mEq/L (ref 3–11)
BILIRUBIN TOTAL: 0.5 mg/dL (ref 0.20–1.20)
BUN: 11.6 mg/dL (ref 7.0–26.0)
CO2: 25 meq/L (ref 22–29)
CREATININE: 0.9 mg/dL (ref 0.6–1.1)
Calcium: 9.8 mg/dL (ref 8.4–10.4)
Chloride: 107 mEq/L (ref 98–109)
EGFR: 63 mL/min/{1.73_m2} — AB (ref >90)
GLUCOSE: 102 mg/dL (ref 70–140)
Potassium: 5.4 mEq/L — ABNORMAL HIGH (ref 3.5–5.1)
SODIUM: 140 meq/L (ref 136–145)
TOTAL PROTEIN: 6.3 g/dL — AB (ref 6.4–8.3)

## 2015-09-10 MED ORDER — CYANOCOBALAMIN 1000 MCG/ML IJ SOLN
1000.0000 ug | Freq: Once | INTRAMUSCULAR | Status: AC
  Administered 2015-09-10: 1000 ug via INTRAMUSCULAR

## 2015-09-10 MED ORDER — PROCHLORPERAZINE MALEATE 10 MG PO TABS: 10.0000 mg | ORAL_TABLET | Freq: Four times a day (QID) | ORAL | Status: DC | PRN

## 2015-09-10 MED ORDER — FOLIC ACID 1 MG PO TABS: 1.0000 mg | ORAL_TABLET | Freq: Every day | ORAL | Status: DC

## 2015-09-10 MED ORDER — CYANOCOBALAMIN 1000 MCG/ML IJ SOLN
INTRAMUSCULAR | Status: AC
  Filled 2015-09-10: qty 1

## 2015-09-10 MED ORDER — DEXAMETHASONE 4 MG PO TABS: ORAL_TABLET | ORAL | Status: DC

## 2015-09-10 NOTE — Progress Notes (Signed)
Oncology Nurse Navigator Documentation  Oncology Nurse Navigator Flowsheets 09/10/2015  Navigator Encounter Type Clinic/MDC/per Dr. Julien Nordmann I contacted [facility] to inquire about test results.  They notified me that results will not be completed until 09/15/15 late day.  I notified Dr. Julien Nordmann.   Patient Visit Type Follow-up  Treatment Phase Other  Interventions Coordination of Care  Coordination of Care -  Time Spent with Patient 30

## 2015-09-10 NOTE — Progress Notes (Signed)
[place] [phone]   [phone]  OFFICE PROGRESS NOTE  [name], MD [place] [address]  DIAGNOSIS: Stage IV (T1b, N2, M1 A) non-small cell lung cancer, adenocarcinoma presenting with right upper lobe lung mass in addition to mediastinal lymphadenopathy and malignant pleural effusion as well as innumerable bilateral pulmonary nodules.  PRIOR THERAPY: None  CURRENT THERAPY: Systemic chemotherapy with carboplatin for AUC of 5 and Alimta 500 MG/M2 every 3 weeks. First dose on 09/16/2015.  INTERVAL HISTORY: Deborah [name] 69 y.o. female returns to the clinic today for follow-up visit accompanied by her husband. The patient is feeling fine today except for the right-sided chest pain or shortness breath with exertion. She was seen recently by Dr. Roxan Hockey for consideration of Pleurx catheter placement but this was delayed until the results of the PET scan becomes available to see if there is a accumulation of the right pleural effusion. The patient denied having any significant weight loss or night sweats. She has no fever or chills. She denied having any significant nausea or vomiting. She had a PET scan as well as MRI of the brain performed recently and she is here for evaluation and discussion of her scan results and recommendation regarding treatment of her condition. The molecular studies for her tumor are still pending and the results are expected to be reported on 09/15/2015. The patient had blood test for EGFR mutation but this was reported to be negative.   MEDICAL HISTORY: Past Medical History  Diagnosis Date  . Allergy   . Jaundice   . Hyperlipidemia 08/04/2011  . Hypothyroidism 08/04/2011  . Atrophic vaginitis   . 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 Basal cell carcinoma of skin 2010  . Osteopenia 10/2012    T score -1.7 FRAX 7%/0.3%  . Vertigo   .  Non-small cell lung cancer September 2016    Adenocarcinoma    ALLERGIES:  has No Known Allergies.  MEDICATIONS:  Current Outpatient Prescriptions  Medication Sig Dispense Refill  . albuterol (PROVENTIL HFA;VENTOLIN HFA) 108 (90 BASE) MCG/ACT inhaler Inhale 2 puffs into the lungs every 6 (six) hours as needed for wheezing or shortness of breath. 1 Inhaler 1  . Cholecalciferol (VITAMIN D-3) 1000 UNITS CAPS Take by mouth daily.    Marland Kitchen levothyroxine (SYNTHROID, LEVOTHROID) 75 MCG tablet TAKE 1 TABLET BY MOUTH DAILY. 90 tablet 3  . LORazepam (ATIVAN) 0.5 MG tablet Take 1 tablet (0.5 mg total) by mouth every 8 (eight) hours. 30 tablet 0  . triamterene-hydrochlorothiazide (DYAZIDE) 37.5-25 MG per capsule Take 1 capsule by mouth daily.      No current facility-administered medications for this visit.    SURGICAL HISTORY:  Past Surgical History  Procedure Laterality Date  . Tonsillectomy  1952  . Vaginal hysterectomy  1980  . Breast biopsy  1999    RADIATION TREATMENTS  . Breast lumpectomy  1998    right    REVIEW OF SYSTEMS:  Constitutional: positive for fatigue Eyes: negative Ears, nose, mouth, throat, and face: negative Respiratory: positive for cough, dyspnea on exertion and pleurisy/chest pain Cardiovascular: negative Gastrointestinal: negative Genitourinary:negative Integument/breast: negative Hematologic/lymphatic: negative Musculoskeletal:negative Neurological: negative Behavioral/Psych: negative Endocrine: negative Allergic/Immunologic: negative   PHYSICAL EXAMINATION: General appearance: alert, cooperative, fatigued and no distress Head: Normocephalic, without obvious abnormality, atraumatic Neck: no adenopathy, no JVD, supple, symmetrical, trachea midline and thyroid  not enlarged, symmetric, no tenderness/mass/nodules Lymph nodes: Cervical, supraclavicular, and axillary nodes normal. Resp: diminished breath sounds RLL and dullness to percussion RLL Back: symmetric,  no curvature. ROM normal. No CVA tenderness. Cardio: regular rate and rhythm, S1, S2 normal, no murmur, click, rub or gallop GI: soft, non-tender; bowel sounds normal; no masses,  no organomegaly Extremities: extremities normal, atraumatic, no cyanosis or edema Neurologic: Alert and oriented X 3, normal strength and tone. Normal symmetric reflexes. Normal coordination and gait  ECOG PERFORMANCE STATUS: 1 - Symptomatic but completely ambulatory  Blood pressure 132/90, pulse 115, temperature 97.9 F (36.6 C), temperature source Oral, resp. rate 17, height $RemoveBe'5\' 9"'AxIZQLawN$  (1.753 m), weight 149 lb (67.586 kg), last menstrual period 10/11/1979, SpO2 95 %.  LABORATORY DATA: Lab Results  Component Value Date   WBC 8.7 09/10/2015   HGB 14.1 09/10/2015   HCT 42.5 09/10/2015   MCV 85.7 09/10/2015   PLT 298 09/10/2015      Chemistry      Component Value Date/Time   NA 140 09/10/2015 0940   NA 139 08/18/2015 1051   K 5.4* 09/10/2015 0940   K 4.7 08/18/2015 1051   CL 103 08/18/2015 1051   CO2 25 09/10/2015 0940   CO2 27 08/18/2015 1051   BUN 11.6 09/10/2015 0940   BUN 21 08/18/2015 1051   CREATININE 0.9 09/10/2015 0940   CREATININE 0.86 08/18/2015 1051      Component Value Date/Time   CALCIUM 9.8 09/10/2015 0940   CALCIUM 9.9 08/18/2015 1051   ALKPHOS 111 09/10/2015 0940   ALKPHOS 93 08/06/2014 1635   AST 37* 09/10/2015 0940   AST 28 08/06/2014 1635   ALT 10 09/10/2015 0940   ALT 18 08/06/2014 1635   BILITOT 0.50 09/10/2015 0940   BILITOT 0.5 08/06/2014 1635       RADIOGRAPHIC STUDIES: Dg Chest 1 View  08/24/2015   CLINICAL DATA:  Post thoracentesis  EXAM: CHEST  1 VIEW  COMPARISON:  Chest x-ray of 08/16/2015 and CT chest of 08/18/2015  FINDINGS: Some of the right pleural effusion has been evacuated after right thoracentesis. No pneumothorax is seen. Abnormal opacity remains at the right lung base representing infiltrate, atelectasis, or possibly tumor. The multiple pulmonary nodules  noted throughout the lungs on CT of the chest are not as well appreciated by chest x-ray, consistent with diffuse pulmonary metastases. Heart size is stable.  IMPRESSION: Some decrease in the volume of the right pleural effusion after right thoracentesis. No pneumothorax.   Electronically Signed   By: Ivar Drape M.D.   On: 08/24/2015 12:28   Dg Chest 2 View  08/25/2015   CLINICAL DATA:  Follow-up right pleural effusion following thoracentesis yesterday, persistent cough and shortness of breath.  EXAM: CHEST  2 VIEW  COMPARISON:  Chest x-ray of August 24, 2015 performed immediately following right thoracentesis.  FINDINGS: The lungs are well-expanded. A slight increase in the volume of pleural fluid on the right has developed. There are assistant areas of increased density in the right infrahilar region. There are coarse nodular densities in the lower lobe on the left which are stable. The cardiac silhouette is top-normal in size. The pulmonary vascularity is not engorged. The mediastinum is normal in width. There is mild degenerative disc disease of the thoracic spine.  IMPRESSION: There has been slight interval increase in the volume of the right-sided pleural effusion which would still be considered small. There is persistent right middle lobe atelectasis, and there are stable coarse  nodular lung markings at both bases.   Electronically Signed   By: David  Martinique M.D.   On: 08/25/2015 11:02   Dg Chest 2 View  08/16/2015   CLINICAL DATA:  Persistent cough and congestion ; shortness of Breath  EXAM: CHEST  2 VIEW  COMPARISON:  July 29, 2015  FINDINGS: Extensive airspace consolidation is noted in the right middle lobe region, progressed from prior study. 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 There is no new opacity on the left.  The heart size and pulmonary vascularity are normal. No adenopathy  is appreciable. There is upper lumbar levoscoliosis. There are surgical clips in the right breast region.  IMPRESSION: Increased consolidation right middle lobe. Question small right pleural effusion. Extensive reticulonodular opacity in both mid and lower lung zones, stable. Question atypical pneumonia versus possible underlying neoplastic presentation. Correlation with chest CT, ideally with intravenous contrast, may well be advisable given the persistence of the reticulonodular interstitial disease and the progression of consolidation on the right.  These results will be called to the ordering clinician or representative by the Radiologist Assistant, and communication documented in the PACS or zVision Dashboard.   Electronically Signed   By: Lowella Grip III M.D.   On: 08/16/2015 10:59   Ct Chest W Contrast  08/18/2015   CLINICAL DATA:  Treated for pneumonia August 10th. Persistent antibiotics. Evaluate right pleural effusion. Prior lumpectomy in 1998.  EXAM: CT CHEST WITH CONTRAST  TECHNIQUE: Multidetector CT imaging of the chest was performed during intravenous contrast administration.  CONTRAST:  11mL OMNIPAQUE IOHEXOL 300 MG/ML  SOLN  COMPARISON:  Plain film of 08/16/2015 and 10/2015.  No prior CT.  FINDINGS: Mediastinum/Nodes: Left low jugular/supraclavicular adenopathy is mild on image 6 com 1.0 cm. No axillary adenopathy. Tortuous descending thoracic aorta. Mild cardiomegaly, with trace pericardial fluid or thickening. No central pulmonary embolism, on this non-dedicated study.  Mediastinal adenopathy. Index right paratracheal node at 1.4 cm on image 17.  Subcarinal node measures 1.7 cm on image 29 of series 2.  Right hilar adenopathy, including at 1.6 x 1.7 cm on image 28.  Lungs/Pleura: Small to the moderate right-sided pleural effusion. No well-defined pleural mass.  Patent airways, with mass effect upon right lower and right middle lobe endobronchial tree.  Right lower lobe volume loss.  Right-sided peribronchovascular interstitial thickening. The most masslike component is within the right upper lobe and measures 2.4 x 1.2 cm on image 23 of series 3. There is right-sided interlobular septal thickening and irregularity. Right middle lobe consolidation, including on image 40 of series 3.  Innumerable bilateral and lower lobe predominant pulmonary nodules. Example subpleural left lower lobe at 8 mm on image 44 of series 3. 6 mm right lower lobe pulmonary nodule on image 35 of series 3.  Upper abdomen: Normal imaged portions of the liver, spleen, stomach, pancreas, adrenal glands, kidneys.  Musculoskeletal: No acute osseous abnormality.  IMPRESSION: 1. Innumerable pulmonary nodules and thoracic adenopathy, most consistent with metastatic disease. Favor breast cancer metastasis. Metastatic disease from a second primary (i.e. lung) could look similar. 2. 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 Areas of consolidation, most apparent in the right middle lobe, could be neoplastic (metastatic disease) or less like represent concurrent infection. 3. Right pleural effusion. 4. Mild left low jugular/supraclavicular adenopathy, suspicious for metastatic disease. These results will be called to the ordering clinician or representative by the Radiologist  Assistant, and communication documented in the PACS or zVision Dashboard.   Electronically Signed   By: Abigail Miyamoto M.D.   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 Assess for brain metastases. Staging.  EXAM: MRI HEAD WITHOUT AND WITH CONTRAST  TECHNIQUE: Multiplanar, multiecho pulse sequences of the brain and surrounding structures were obtained without and with intravenous contrast.  CONTRAST:  86mL MULTIHANCE GADOBENATE DIMEGLUMINE 529 MG/ML IV SOLN  COMPARISON:  07/15/2013  FINDINGS: Diffusion imaging does not show any acute  infarction or other cause of restricted diffusion.  In the right cerebellum, there is an incidental venous angioma without evidence of previous hemorrhage. There is no evidence of metastatic disease affecting the brain, skull or skullbase. There is no evidence of old or acute infarction, hydrocephalus or extra-axial collection. No other abnormal enhancement. No pituitary mass. No inflammatory sinus disease.  IMPRESSION: Normal study.  No evidence of metastatic disease.   Electronically Signed   By: Nelson Chimes M.D.   On: 09/08/2015 14:36   Nm Pet Image Initial (pi) Skull Base To Thigh  09/01/2015   CLINICAL DATA:  Pleural metastasis at RIGHT lung base. Treatment strategy for small cell lung cancer.  EXAM: NUCLEAR MEDICINE PET SKULL BASE TO THIGH  TECHNIQUE: 7.2 mCi F-18 FDG was injected intravenously. Full-ring PET imaging was performed from the skull base to thigh after the radiotracer. CT data was obtained and used for attenuation correction and anatomic localization.  FASTING BLOOD GLUCOSE:  Value: 120 mg/dl  COMPARISON:  CT 08/18/2015  FINDINGS: NECK  Bilateral small hypermetabolic supraclavicular lymph node. Example 9 mm RIGHT supraclavicular lymph node on image 40, series 4. 11 mm RIGHT supraclavicular node on image 46 with SUV max equal 7.9  CHEST  There is ill-defined masslike hypermetabolic tissue at the RIGHT hilum with SUV max equal 10.37 on image 76 of the fused data set. Rounded mass along the fissure measuring 3.7 x 4.6 cm on image 75 has lower metabolic activity.  Small bilateral pulmonary nodules have associated metabolic activity is noted in the LEFT lower lobe with SUV max equal 8.2.  There is intensely hypermetabolic small LEFT axillary lymph node measuring only 6 mm with SUV max equal 25. Hypermetabolic LEFT internal mammary lymph node on image 61, series 4.  There is moderate size RIGHT pleural effusion which is slightly decreased volume from comparison CT scan  There several malignant  nodules along the pleural of the inferior RIGHT hemi thorax  ABDOMEN/PELVIS  No abnormal metabolic activity liver spleen. Normal glands. The pancreas no hypermetabolic abdominal pelvic adenopathy  SKELETON  No focal hypermetabolic activity to suggest skeletal metastasis.  IMPRESSION: 1. Infiltrative ill-defined mass at the RIGHT hilum with intense metabolic activity. 2. Multiple bilateral pulmonary nodules with associated metabolic activity consistent with metastasis. 3. Rounded mass along the oblique fissure may represent rounded atelectasis as metabolic activity is less than the hilar activity. 4. Metastatic adenopathy including bilateral supraclavicular nodes, LEFT internal mammary node, and LEFT axillary node. The LEFT axillary node is a typically intensely hypermetabolic for size. 5. Pleural metastasis at the RIGHT lung base. 6. Moderate RIGHT pleural effusion is slightly decreased from comparison exam.   Electronically Signed   By: Suzy Bouchard M.D.   On: 09/01/2015 10:28   US Thoracentesis Asp Pleural Space W/img Guide  08/24/2015   INDICATION: Symptomatic right sided pleural effusion  EXAM: US THORACENTESIS ASP PLEURAL SPACE W/IMG GUIDE  COMPARISON:  CT Chest 08/18/2015.  MEDICATIONS: None  COMPLICATIONS: None immediate  TECHNIQUE: Informed written consent was obtained from the patient after a discussion of the risks, benefits and alternatives to treatment. A timeout was performed prior to the initiation of the procedure.  Initial ultrasound scanning demonstrates a right pleural effusion. The lower chest was prepped and draped in the usual sterile fashion. 1% lidocaine was used for local anesthesia.  An ultrasound image was saved for documentation purposes. A 6 Fr Safe-T-Centesis catheter was introduced. The thoracentesis was performed. The catheter was removed and a dressing was applied. The patient tolerated the procedure well without immediate post procedural complication. The patient was escorted to  have an upright chest radiograph.  FINDINGS: A total of approximately 1.4 liters of serous/blood tinged fluid was removed. Requested samples were sent to the laboratory.  IMPRESSION: Successful ultrasound-guided right sided thoracentesis yielding 1.4 liters of pleural fluid.  Read By:  Tsosie Billing PA-C   Electronically Signed   By: Aletta Edouard M.D.   On: 08/24/2015 13:00    ASSESSMENT AND PLAN: This is a very pleasant 69 years old white female recently diagnosed with stage IV non-small cell lung cancer, adenocarcinoma. The blood test for EGFR mutation was reported to be negative but the molecular studies from [facility] are still pending and expected to be reported on 09/15/2015. I discussed the PET scan as well as MRI results with the patient today. I also discussed with the patient her treatment options including proceeding with systemic chemotherapy until the molecular studies are pending. I also gave the patient the option of palliative care and hospice referral. The patient is interested in proceeding with systemic chemotherapy. I recommended for the patient had a regimen consisting of carboplatin for AUC of 5 and Alimta 500 MG/M2 every 3 weeks. I discussed with the patient adverse effects of this treatment including but not limited to alopecia, myelosuppression, nausea and vomiting, peripheral neuropathy, liver or renal dysfunction. We will arrange for the patient to receive vitamin B 12 injection today. The patient would also receive prescription for Compazine 10 mg by mouth every 6 hours as needed for nausea, Decadron 4 mg by mouth twice a day, the day before, day of and day after the chemotherapy in addition to folic acid 1 mg by mouth daily. I will arrange for the patient to have a chemotherapy education class before starting the first dose of the chemotherapy. She is expected to start the first dose of this treatment on 09/16/2015 but this molecular study showed actionable mutation,  we will discontinue this regimen and the patient will start treatment with targeted therapy. The patient would also see Dr. Roxan Hockey for consideration of Pleurx catheter placement. She will come back for follow-up visit in 2 weeks for reevaluation and management of any adverse effect of her treatment. The patient was advised to call immediately if she has any concerning symptoms in the interval. The patient voices understanding of current disease status and treatment options and is in agreement with the current care plan.  All questions were answered. The patient knows to call the clinic with any problems, questions or concerns. We can certainly see the patient much sooner if necessary.  I spent 20 minutes counseling the patient face to face. The total time spent in the appointment was 30 minutes.  Disclaimer: This note was dictated with voice recognition software. Similar sounding words can inadvertently be transcribed and may  not be corrected upon review.

## 2015-09-10 NOTE — Telephone Encounter (Signed)
GAVE AND PRINTED APPT SCHED AND AVS FOR PT FOR sEPT THRU nov

## 2015-09-13 ENCOUNTER — Telehealth: Payer: Self-pay | Admitting: *Deleted

## 2015-09-13 NOTE — Telephone Encounter (Signed)
Per Dr. Julien Nordmann, I called patient to update on negative EGFR mutation.  I called and left a vm message asking her to call me with my name and phone number

## 2015-09-13 NOTE — Telephone Encounter (Signed)
Oncology Nurse Navigator Documentation  Oncology Nurse Navigator Flowsheets 09/13/2015  Navigator Encounter Type Telephone/patient called me.  I spoke with her to update her that she does not have EGFR mutation and that tx will proceed with chemotherapy scheduled this week.  She verbalized understanding. 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    Patient Visit Type Follow-up  Treatment Phase -  Interventions Other  Coordination of Care -  Time Spent with Patient 15    

## 2015-09-14 ENCOUNTER — Encounter (HOSPITAL_COMMUNITY): Payer: Self-pay | Admitting: *Deleted

## 2015-09-14 MED ORDER — DEXTROSE 5 % IV SOLN
1.5000 g | INTRAVENOUS | Status: AC
  Administered 2015-09-15: 1.5 g via INTRAVENOUS
  Filled 2015-09-14: qty 1.5

## 2015-09-15 ENCOUNTER — Ambulatory Visit (HOSPITAL_COMMUNITY): Payer: Medicare Other | Admitting: Anesthesiology

## 2015-09-15 ENCOUNTER — Encounter (HOSPITAL_COMMUNITY)
Admission: RE | Disposition: A | Discharge status: Home / Self Care | Payer: Self-pay | Source: Ambulatory Visit | Attending: Thoracic Surgery (Cardiothoracic Vascular Surgery)

## 2015-09-15 ENCOUNTER — Ambulatory Visit (HOSPITAL_COMMUNITY): Payer: Medicare Other

## 2015-09-15 ENCOUNTER — Encounter (HOSPITAL_COMMUNITY): Payer: Self-pay | Admitting: Surgery

## 2015-09-15 ENCOUNTER — Ambulatory Visit (HOSPITAL_COMMUNITY)
Admission: RE | Admit: 2015-09-15 | Discharge: 2015-09-15 | Disposition: A | Discharge status: Home / Self Care | Payer: Medicare Other | Source: Ambulatory Visit | Attending: Thoracic Surgery (Cardiothoracic Vascular Surgery) | Admitting: Thoracic Surgery (Cardiothoracic Vascular Surgery)

## 2015-09-15 DIAGNOSIS — Z853 Personal history of malignant neoplasm of breast: Secondary | ICD-10-CM | POA: Diagnosis not present

## 2015-09-15 DIAGNOSIS — Z79899 Other long term (current) drug therapy: Secondary | ICD-10-CM | POA: Insufficient documentation

## 2015-09-15 DIAGNOSIS — C3411 Malignant neoplasm of upper lobe, right bronchus or lung: Secondary | ICD-10-CM | POA: Diagnosis not present

## 2015-09-15 DIAGNOSIS — F419 Anxiety disorder, unspecified: Secondary | ICD-10-CM | POA: Insufficient documentation

## 2015-09-15 DIAGNOSIS — R0602 Shortness of breath: Secondary | ICD-10-CM | POA: Diagnosis not present

## 2015-09-15 DIAGNOSIS — E039 Hypothyroidism, unspecified: Secondary | ICD-10-CM | POA: Insufficient documentation

## 2015-09-15 DIAGNOSIS — J90 Pleural effusion, not elsewhere classified: Secondary | ICD-10-CM | POA: Diagnosis not present

## 2015-09-15 DIAGNOSIS — J910 Malignant pleural effusion: Secondary | ICD-10-CM | POA: Insufficient documentation

## 2015-09-15 DIAGNOSIS — R590 Localized enlarged lymph nodes: Secondary | ICD-10-CM | POA: Diagnosis not present

## 2015-09-15 DIAGNOSIS — E785 Hyperlipidemia, unspecified: Secondary | ICD-10-CM | POA: Diagnosis not present

## 2015-09-15 HISTORY — DX: Reserved for inherently not codable concepts without codable children: IMO0001

## 2015-09-15 HISTORY — PX: CHEST TUBE INSERTION: SHX231

## 2015-09-15 HISTORY — DX: Meniere's disease, unspecified ear: H81.09

## 2015-09-15 LAB — COMPREHENSIVE METABOLIC PANEL
ALT: 12 U/L — ABNORMAL LOW (ref 14–54)
AST: 41 U/L (ref 15–41)
Albumin: 3.3 g/dL — ABNORMAL LOW (ref 3.5–5.0)
Alkaline Phosphatase: 95 U/L (ref 38–126)
Anion gap: 9 (ref 5–15)
BUN: 13 mg/dL (ref 6–20)
CHLORIDE: 107 mmol/L (ref 101–111)
CO2: 23 mmol/L (ref 22–32)
Calcium: 9.5 mg/dL (ref 8.9–10.3)
Creatinine, Ser: 0.86 mg/dL (ref 0.44–1.00)
GFR calc Af Amer: >60 mL/min (ref >60)
Glucose, Bld: 121 mg/dL — ABNORMAL HIGH (ref 65–99)
POTASSIUM: 4.5 mmol/L (ref 3.5–5.1)
SODIUM: 139 mmol/L (ref 135–145)
Total Bilirubin: 0.7 mg/dL (ref 0.3–1.2)
Total Protein: 5.8 g/dL — ABNORMAL LOW (ref 6.5–8.1)

## 2015-09-15 LAB — CBC
HCT: 43.2 % (ref 36.0–46.0)
Hemoglobin: 14.3 g/dL (ref 12.0–15.0)
MCH: 28.9 pg (ref 26.0–34.0)
MCHC: 33.1 g/dL (ref 30.0–36.0)
MCV: 87.3 fL (ref 78.0–100.0)
PLATELETS: 239 10*3/uL (ref 150–400)
RBC: 4.95 MIL/uL (ref 3.87–5.11)
RDW: 13.5 % (ref 11.5–15.5)
WBC: 8.8 10*3/uL (ref 4.0–10.5)

## 2015-09-15 LAB — PROTIME-INR
INR: 1.08 (ref 0.00–1.49)
PROTHROMBIN TIME: 14.2 s (ref 11.6–15.2)

## 2015-09-15 LAB — APTT: APTT: 29 s (ref 24–37)

## 2015-09-15 SURGERY — INSERTION, PLEURAL DRAINAGE CATHETER
Anesthesia: Monitor Anesthesia Care | Laterality: Right

## 2015-09-15 MED ORDER — FENTANYL CITRATE (PF) 250 MCG/5ML IJ SOLN
INTRAMUSCULAR | Status: AC
Start: 2015-09-15 — End: 2015-09-15
  Filled 2015-09-15: qty 5

## 2015-09-15 MED ORDER — PROPOFOL 10 MG/ML IV BOLUS
INTRAVENOUS | Status: AC
  Filled 2015-09-15: qty 20

## 2015-09-15 MED ORDER — MIDAZOLAM HCL 5 MG/5ML IJ SOLN
INTRAMUSCULAR | Status: DC | PRN
  Administered 2015-09-15: 2 mg via INTRAVENOUS

## 2015-09-15 MED ORDER — ONDANSETRON HCL 4 MG/2ML IJ SOLN
INTRAMUSCULAR | Status: DC | PRN
  Administered 2015-09-15: 4 mg via INTRAVENOUS

## 2015-09-15 MED ORDER — FENTANYL CITRATE (PF) 250 MCG/5ML IJ SOLN
INTRAMUSCULAR | Status: AC
  Filled 2015-09-15: qty 5

## 2015-09-15 MED ORDER — FENTANYL CITRATE (PF) 100 MCG/2ML IJ SOLN
INTRAMUSCULAR | Status: DC | PRN
  Administered 2015-09-15: 50 ug via INTRAVENOUS
  Administered 2015-09-15: 25 ug via INTRAVENOUS

## 2015-09-15 MED ORDER — PROTAMINE SULFATE 10 MG/ML IV SOLN
INTRAVENOUS | Status: AC
  Filled 2015-09-15: qty 10

## 2015-09-15 MED ORDER — HYDROMORPHONE HCL 1 MG/ML IJ SOLN
INTRAMUSCULAR | Status: AC
  Administered 2015-09-15: 0.25 mg via INTRAVENOUS
  Filled 2015-09-15: qty 1

## 2015-09-15 MED ORDER — TRAMADOL HCL 50 MG PO TABS
ORAL_TABLET | ORAL | Status: AC
  Administered 2015-09-15: 50 mg
  Filled 2015-09-15: qty 1

## 2015-09-15 MED ORDER — LIDOCAINE HCL 1 % IJ SOLN
INTRAMUSCULAR | Status: DC | PRN
  Administered 2015-09-15: 9 mL

## 2015-09-15 MED ORDER — MIDAZOLAM HCL 2 MG/2ML IJ SOLN
INTRAMUSCULAR | Status: AC
  Filled 2015-09-15: qty 4

## 2015-09-15 MED ORDER — LACTATED RINGERS IV SOLN
INTRAVENOUS | Status: DC
  Administered 2015-09-15 (×2): via INTRAVENOUS

## 2015-09-15 MED ORDER — TRAMADOL HCL 50 MG PO TABS: 50.0000 mg | ORAL_TABLET | Freq: Four times a day (QID) | ORAL | Status: DC | PRN

## 2015-09-15 MED ORDER — HYDROMORPHONE HCL 1 MG/ML IJ SOLN
0.2500 mg | INTRAMUSCULAR | Status: DC | PRN
  Administered 2015-09-15 (×2): 0.25 mg via INTRAVENOUS

## 2015-09-15 MED ORDER — PROMETHAZINE HCL 25 MG/ML IJ SOLN: 6.2500 mg | INTRAMUSCULAR | Status: DC | PRN

## 2015-09-15 MED ORDER — MIDAZOLAM HCL 2 MG/2ML IJ SOLN
INTRAMUSCULAR | Status: AC
Start: 2015-09-15 — End: 2015-09-15
  Filled 2015-09-15: qty 2

## 2015-09-15 MED ORDER — 0.9 % SODIUM CHLORIDE (POUR BTL) OPTIME
TOPICAL | Status: DC | PRN
  Administered 2015-09-15: 1000 mL

## 2015-09-15 MED ORDER — PROPOFOL 500 MG/50ML IV EMUL
INTRAVENOUS | Status: DC | PRN
  Administered 2015-09-15: 100 ug/kg/min via INTRAVENOUS

## 2015-09-15 MED ORDER — TRAMADOL HCL 50 MG PO TABS
50.0000 mg | ORAL_TABLET | Freq: Once | ORAL | Status: DC
Start: 2015-09-15 — End: 2015-09-15

## 2015-09-15 SURGICAL SUPPLY — 26 items
ADH SKN CLS APL DERMABOND .7 (GAUZE/BANDAGES/DRESSINGS) ×1
CANISTER SUCTION 2500CC (MISCELLANEOUS) ×3 IMPLANT
COVER SURGICAL LIGHT HANDLE (MISCELLANEOUS) ×3 IMPLANT
DERMABOND ADVANCED (GAUZE/BANDAGES/DRESSINGS) ×2
DERMABOND ADVANCED .7 DNX12 (GAUZE/BANDAGES/DRESSINGS) ×1 IMPLANT
DRAPE C-ARM 42X72 X-RAY (DRAPES) ×3 IMPLANT
DRAPE LAPAROSCOPIC ABDOMINAL (DRAPES) ×3 IMPLANT
GLOVE SURG SIGNA 7.5 PF LTX (GLOVE) ×3 IMPLANT
GOWN STRL REUS W/ TWL LRG LVL3 (GOWN DISPOSABLE) ×1 IMPLANT
GOWN STRL REUS W/ TWL XL LVL3 (GOWN DISPOSABLE) ×1 IMPLANT
GOWN STRL REUS W/TWL LRG LVL3 (GOWN DISPOSABLE) ×3
GOWN STRL REUS W/TWL XL LVL3 (GOWN DISPOSABLE) ×3
KIT BASIN OR (CUSTOM PROCEDURE TRAY) ×3 IMPLANT
KIT PLEURX DRAIN CATH 1000ML (MISCELLANEOUS) ×5 IMPLANT
KIT PLEURX DRAIN CATH 15.5FR (DRAIN) ×3 IMPLANT
KIT ROOM TURNOVER OR (KITS) ×3 IMPLANT
NS IRRIG 1000ML POUR BTL (IV SOLUTION) ×3 IMPLANT
PACK GENERAL/GYN (CUSTOM PROCEDURE TRAY) ×3 IMPLANT
PAD ARMBOARD 7.5X6 YLW CONV (MISCELLANEOUS) ×6 IMPLANT
SET DRAINAGE LINE (MISCELLANEOUS) IMPLANT
SUT ETHILON 3 0 FSL (SUTURE) ×3 IMPLANT
SUT VIC AB 3-0 X1 27 (SUTURE) ×3 IMPLANT
TOWEL OR 17X24 6PK STRL BLUE (TOWEL DISPOSABLE) ×3 IMPLANT
TOWEL OR 17X26 10 PK STRL BLUE (TOWEL DISPOSABLE) ×3 IMPLANT
VALVE REPLACEMENT CAP (MISCELLANEOUS) IMPLANT
WATER STERILE IRR 1000ML POUR (IV SOLUTION) ×3 IMPLANT

## 2015-09-15 NOTE — Anesthesia Postprocedure Evaluation (Signed)
  Anesthesia Post-op Note  Patient: Deborah [name]  Procedure(s) Performed: Procedure(s): INSERTION RIGHT PLEURAL DRAINAGE CATHETER (Right)  Patient Location: PACU  Anesthesia Type:MAC  Level of Consciousness: awake and alert   Airway and Oxygen Therapy: Patient Spontanous Breathing  Post-op Pain: none  Post-op Assessment: Post-op Vital signs reviewed              Post-op Vital Signs: Reviewed  Last Vitals:  Filed Vitals:   09/15/15 1630  BP: 122/79  Pulse: 105  Temp: 36.7 C  Resp: 20    Complications: No apparent anesthesia complications

## 2015-09-15 NOTE — Interval H&P Note (Signed)
History and Physical Interval Note:  09/15/2015 1:58 PM  Deborah [name]  has presented today for surgery, with the diagnosis of right pleural effusion  The various methods of treatment have been discussed with the patient and family. After consideration of risks, benefits and other options for treatment, the patient has consented to  Procedure(s): INSERTION PLEURAL DRAINAGE CATHETER (Right) as a surgical intervention .  The patient's history has been reviewed, patient examined, no change in status, stable for surgery.  I have reviewed the patient's chart and labs.  Questions were answered to the patient's satisfaction.     Melrose Nakayama

## 2015-09-15 NOTE — Discharge Instructions (Signed)
Do not drive or engage in heavy physical activity for 24 hours  You may shower tomorrow  Leave dressing in place when showering  Call [phone] if you develop chest pain, shortness of breath, fever > 101F or notice redness or drainage at the incision  A home health nurse will be arranged for catheter care  Call [phone] for a follow up appointment in 3 weeks

## 2015-09-15 NOTE — Brief Op Note (Signed)
09/15/2015  3:15 PM  PATIENT:  Lezlie Lye  69 y.o. female  PRE-OPERATIVE DIAGNOSIS:  Malignant right pleural effusion  POST-OPERATIVE DIAGNOSIS:  Malignant right pleural effusion  PROCEDURE:  Procedure(s): INSERTION RIGHT PLEURAL DRAINAGE CATHETER (Right)  SURGEON:  Surgeon(s) and Role:    * Melrose Nakayama, MD - Primary  PHYSICIAN ASSISTANT:   ASSISTANTS: none   ANESTHESIA:   MAC  EBL:  Total I/O In: 500 [I.V.:500] Out: -   BLOOD ADMINISTERED:none   LOCAL MEDICATIONS USED:  LIDOCAINE  and Amount: 10 ml  SPECIMEN:  Source of Specimen:  pleural fluid  DISPOSITION OF SPECIMEN:  PATHOLOGY  COUNTS:  YES  PLAN OF CARE: Discharge to home after PACU  PATIENT DISPOSITION:  PACU - hemodynamically stable.   Delay start of Pharmacological VTE agent (>24hrs) due to surgical blood loss or risk of bleeding: not applicable  1.5 L serous fluid drained

## 2015-09-15 NOTE — Transfer of Care (Signed)
Immediate Anesthesia Transfer of Care Note  Patient: Deborah [name]  Procedure(s) Performed: Procedure(s): INSERTION RIGHT PLEURAL DRAINAGE CATHETER (Right)  Patient Location: PACU  Anesthesia Type:MAC  Level of Consciousness: awake, alert  and oriented  Airway & Oxygen Therapy: Patient Spontanous Breathing and Patient connected to face mask oxygen  Post-op Assessment: Report given to RN and Post -op Vital signs reviewed and stable  Post vital signs: Reviewed and stable  Last Vitals:  Filed Vitals:   09/15/15 1121  BP: 124/81  Pulse: 107  Temp: 36.4 C  Resp: 18    Complications: No apparent anesthesia complications

## 2015-09-15 NOTE — H&P (View-Only) (Signed)
PCP is Deborah Cower, MD Referring Provider is Deborah Bears, MD  Chief Complaint  Patient presents with  . Pleural Effusion    surgical eval for PleurX placement    HPI: 69 year old Deborah [name] with a malignant right pleural effusion sent for consideration of pleural catheter placement.  Deborah Deborah [name] with a history of breast cancer who was in her usual state of health until early August when she started noticing cough, wheezing, shortness of breath with exertion, orthopnea, and general malaise. She saw Dr. Jenny Deborah [name]. A chest x-ray suggested pneumonia. She was started on anti-biotics, but did not improve. A repeat chest x-ray showed progression in the right base with a pleural effusion. That subsequently led to a CT of the chest which showed a right upper lobe mass, mediastinal adenopathy, multiple additional lung nodules, and a right pleural effusion. A thoracentesis was done on 08/24/2015. 1.4 L of fluid was drained. She did improve symptomatically after the fluid was drained. She says her breathing is been stable since the day after the thoracentesis.  Zubrod Score: At the time of surgery this patient's most appropriate activity status/level should be described as: '[]'$     0    Normal activity, no symptoms '[x]'$     1    Restricted in physical strenuous activity but ambulatory, able to do out light work '[]'$     2    Ambulatory and capable of self care, unable to do work activities, up and about >50 % of waking hours                              '[]'$     3    Only limited self care, in bed greater than 50% of waking hours '[]'$     4    Completely disabled, no self care, confined to bed or chair '[]'$     5    Moribund      Past Medical History  Diagnosis Date  . Allergy   . Jaundice   . Hyperlipidemia 08/04/2011  . Hypothyroidism 08/04/2011  . Atrophic vaginitis   . 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 Basal cell carcinoma of skin 2010   . Osteopenia 10/2012    T score -1.7 FRAX 7%/0.3%  . Vertigo   . Non-small cell lung cancer September 2016    Adenocarcinoma    Past Surgical History  Procedure Laterality Date  . Tonsillectomy  1952  . Vaginal hysterectomy  1980  . Breast biopsy  1999    RADIATION TREATMENTS  . Breast lumpectomy  1998    right    Family History  Problem Relation Age of Onset  . Dementia Mother   . Breast cancer Mother     Breast Cancer  . Heart disease Other     Grandparents  . Heart disease Father   . Diabetes Daughter     TYPE 2  . Melanoma Mother     Social History Social History  Substance Use Topics  . Smoking status: Never Smoker   . Smokeless tobacco: Never Used  . Alcohol Use: 1.2 oz/week    2 Glasses of wine per week     Comment: 1 glass wine per wk at times    Current Outpatient Prescriptions  Medication Sig Dispense Refill  . albuterol (PROVENTIL HFA;VENTOLIN HFA) 108 (90 BASE) MCG/ACT inhaler Inhale 2 puffs into the  lungs every 6 (six) hours as needed for wheezing or shortness of breath. 1 Inhaler 1  . Cholecalciferol (VITAMIN D-3) 1000 UNITS CAPS Take by mouth daily.    Marland Kitchen levothyroxine (SYNTHROID, LEVOTHROID) 75 MCG tablet TAKE 1 TABLET BY MOUTH DAILY. 90 tablet 3  . LORazepam (ATIVAN) 0.5 MG tablet Take 1 tablet (0.5 mg total) by mouth every 8 (eight) hours. 30 tablet 0  . triamterene-hydrochlorothiazide (DYAZIDE) 37.5-25 MG per capsule Take 1 capsule by mouth daily.      No current facility-administered medications for this visit.    No Known Allergies  Review of Systems  Constitutional: Positive for fever, diaphoresis, activity change, appetite change, fatigue and unexpected weight change (lost 11 pounds in 3 months).  HENT: Positive for congestion.   Eyes: Negative for visual disturbance.  Respiratory: Positive for cough, chest tightness, shortness of breath and wheezing.   Cardiovascular: Negative for chest pain and leg swelling.  Gastrointestinal:  Positive for diarrhea.  Neurological: Negative for syncope and weakness.  Hematological: Negative for adenopathy. Does not bruise/bleed easily.  All other systems reviewed and are negative.   BP 120/80 mmHg  Pulse 120  Resp 20  Ht '5\' 9"'$  (1.753 m)  Wt 146 lb (66.225 kg)  BMI 21.55 kg/m2  SpO2 95%  LMP 10/11/1979 Physical Exam  Constitutional: She is oriented to person, place, and time. She appears well-developed and well-nourished. No distress.  HENT:  Head: Normocephalic and atraumatic.  Eyes: Conjunctivae and EOM are normal. No scleral icterus.  Neck: Neck supple. No thyromegaly present.  Pulmonary/Chest: Effort normal. She has no wheezes.  Diminished BS and dullness to percussion right base  Abdominal: Soft. Bowel sounds are normal. She exhibits no distension. There is no tenderness.  Musculoskeletal: She exhibits no edema.  Lymphadenopathy:    She has no cervical adenopathy.  Neurological: She is alert and oriented to person, place, and time. No cranial nerve deficit. She exhibits normal muscle tone.  Skin: Skin is warm and dry.  Vitals reviewed.    Diagnostic Tests: CT CHEST WITH CONTRAST  TECHNIQUE: Multidetector CT imaging of the chest was performed during intravenous contrast administration.  CONTRAST: 39m OMNIPAQUE IOHEXOL 300 MG/ML SOLN  COMPARISON: Plain film of 08/16/2015 and 10/2015. No prior CT.  FINDINGS: Mediastinum/Nodes: Left low jugular/supraclavicular adenopathy is mild on image 6 com 1.0 cm. No axillary adenopathy. Tortuous descending thoracic aorta. Mild cardiomegaly, with trace pericardial fluid or thickening. No central pulmonary embolism, on this non-dedicated study.  Mediastinal adenopathy. Index right paratracheal node at 1.4 cm on image 17.  Subcarinal node measures 1.7 cm on image 29 of series 2.  Right hilar adenopathy, including at 1.6 x 1.7 cm on image 28.  Lungs/Pleura: Small to the moderate right-sided pleural  effusion. No well-defined pleural mass.  Patent airways, with mass effect upon right lower and right middle lobe endobronchial tree.  Right lower lobe volume loss. Right-sided peribronchovascular interstitial thickening. The most masslike component is within the right upper lobe and measures 2.4 x 1.2 cm on image 23 of series 3. There is right-sided interlobular septal thickening and irregularity. Right middle lobe consolidation, including on image 40 of series 3.  Innumerable bilateral and lower lobe predominant pulmonary nodules. Example subpleural left lower lobe at 8 mm on image 44 of series 3. 6 mm right lower lobe pulmonary nodule on image 35 of series 3.  Upper abdomen: Normal imaged portions of the liver, spleen, stomach, pancreas, adrenal glands, kidneys.  Musculoskeletal: No acute osseous abnormality.  IMPRESSION: 1. Innumerable pulmonary nodules and thoracic adenopathy, most consistent with metastatic disease. Favor breast cancer metastasis. Metastatic disease from a second primary (i.e. lung) could look similar. 2. 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 Areas of consolidation, most apparent in the right middle lobe, could be neoplastic (metastatic disease) or less like represent concurrent infection. 3. Right pleural effusion. 4. Mild left low jugular/supraclavicular adenopathy, suspicious for metastatic disease. These results will be called to the ordering clinician or representative by the Radiologist Assistant, and communication documented in the PACS or zVision Dashboard.   Electronically Signed  By: Abigail Miyamoto M.D.  On: 08/18/2015 14:58  I personally reviewed the CT scan as well as multiple chest x-rays and concur with the findings as noted above. Her most recent chest x-ray was 08/25/2015 which showed slight reaccumulation of her effusion.  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 She had thoracentesis about a week ago which drained 1.4 L of fluid. She did improve symptomatically after the drainage. Her symptoms have not really recurred at this point, but it is relatively early, and the most likely scenario is that the effusion will reaccumulate and become symptomatic once again.  I discussed the possibility of placing a right pleural catheter for chronic management of her malignant pleural effusion. I discussed the general nature of the procedure and the care of the catheter. She her husband understand this would be done on an outpatient basis. We would arrange for a home health nurse for initial drainages and teaching. I discussed the indications, risks, benefits, and alternatives. They understand the risks include, but are not limited to bleeding, pneumothorax, catheter malposition, catheter occlusion, infection, as well as the possibility of other unforeseeable complications.  I did tell him that we typically wait to see if the effusion reaccumulates before proceeding with catheter placement.  She is having a PET/CT on Thursday. That should give [place] a good indicator of the radial reaccumulation.  I provided them with an informational packet for Pleurx catheters. They will review that information and then let us know what they decide to do.  Plan: Right pleural catheter placement under local with intravenous sedation. The patient will call to schedule if she decides to proceed.  Melrose Nakayama, MD [facility] [phone]

## 2015-09-15 NOTE — Anesthesia Preprocedure Evaluation (Addendum)
Anesthesia Evaluation  Patient identified by MRN, date of birth, ID band Patient awake    Reviewed: Allergy & Precautions, NPO status , Patient's Chart, lab work & pertinent test results  Airway Mallampati: II  TM Distance: >3 FB Neck ROM: Full    Dental   Pulmonary  Lung CA with malignant pleural effusion- right.   breath sounds clear to auscultation       Cardiovascular negative cardio ROS   Rhythm:Regular Rate:Normal     Neuro/Psych Anxiety negative neurological ROS     GI/Hepatic negative GI ROS, Neg liver ROS,   Endo/Other  Hypothyroidism   Renal/GU negative Renal ROS     Musculoskeletal   Abdominal   Peds  Hematology negative hematology ROS (+)   Anesthesia Other Findings   Reproductive/Obstetrics                            Anesthesia Physical Anesthesia Plan  ASA: III  Anesthesia Plan: MAC   Post-op Pain Management:    Induction: Intravenous  Airway Management Planned: Natural Airway and Simple Face Mask  Additional Equipment:   Intra-op Plan:   Post-operative Plan:   Informed Consent: I have reviewed the patients History and Physical, chart, labs and discussed the procedure including the risks, benefits and alternatives for the proposed anesthesia with the patient or authorized representative who has indicated his/her understanding and acceptance.     Plan Discussed with: CRNA  Anesthesia Plan Comments:         Anesthesia Quick Evaluation

## 2015-09-15 NOTE — Care Management Note (Signed)
Case Management Note  Patient Details  Name: Deborah [name] MRN: [id number] Date of Birth: 10/20/1946  Subjective/Objective:                  69 y.o. Female PRE-OPERATIVE DIAGNOSIS: Malignant right pleural effusion.//Home with spouse.   Action/Plan: Follow for disposition needs.   Expected Discharge Date:       09/15/15           Expected Discharge Plan:  [place]  In-House Referral:  NA  Discharge planning Services  CM Consult  Post Acute Care Choice:  Home Health Choice offered to:  Patient  DME Arranged:  Chest tube pluerex DME Agency:  [place]:  RN [facility] Agency:  [place]  Status of Service:  Completed, signed off  Medicare Important Message Given:    Date Medicare IM Given:    Medicare IM give by:    Date Additional Medicare IM Given:    Additional Medicare Important Message give by:     If discussed at [place] of Stay Meetings, dates discussed:    Additional Comments: Camellia J. Clydene Laming, RN, BSN, [employer] [phone] Spoke with pt at bedside regarding discharge planning for [facility]. Offered pt list of home health agencies to choose from.  Pt chose [facility] to render services. Edwinna Areola, RN of [facility] notified. DME needs identified at this time include drainage kits of which pt is taking a case home today.  Fuller Mandril, RN 09/15/2015, 4:18 PM

## 2015-09-16 ENCOUNTER — Other Ambulatory Visit: Payer: Self-pay | Admitting: Internal Medicine

## 2015-09-16 ENCOUNTER — Ambulatory Visit (HOSPITAL_BASED_OUTPATIENT_CLINIC_OR_DEPARTMENT_OTHER): Payer: Medicare Other

## 2015-09-16 ENCOUNTER — Other Ambulatory Visit: Payer: Medicare Other

## 2015-09-16 ENCOUNTER — Encounter (HOSPITAL_COMMUNITY): Payer: Self-pay | Admitting: Thoracic Surgery (Cardiothoracic Vascular Surgery)

## 2015-09-16 ENCOUNTER — Encounter: Payer: Self-pay | Admitting: Internal Medicine

## 2015-09-16 ENCOUNTER — Other Ambulatory Visit (HOSPITAL_BASED_OUTPATIENT_CLINIC_OR_DEPARTMENT_OTHER): Payer: Medicare Other

## 2015-09-16 VITALS — BP 118/79 | HR 99 | Temp 97.9°F

## 2015-09-16 DIAGNOSIS — C3491 Malignant neoplasm of unspecified part of right bronchus or lung: Secondary | ICD-10-CM

## 2015-09-16 DIAGNOSIS — Z5111 Encounter for antineoplastic chemotherapy: Secondary | ICD-10-CM | POA: Diagnosis not present

## 2015-09-16 LAB — COMPREHENSIVE METABOLIC PANEL (CC13)
ALT: 9 U/L (ref 0–55)
ANION GAP: 9 meq/L (ref 3–11)
AST: 31 U/L (ref 5–34)
Albumin: 3.3 g/dL — ABNORMAL LOW (ref 3.5–5.0)
Alkaline Phosphatase: 107 U/L (ref 40–150)
BILIRUBIN TOTAL: 0.58 mg/dL (ref 0.20–1.20)
BUN: 18.5 mg/dL (ref 7.0–26.0)
CHLORIDE: 106 meq/L (ref 98–109)
CO2: 23 meq/L (ref 22–29)
Calcium: 9.7 mg/dL (ref 8.4–10.4)
Creatinine: 0.9 mg/dL (ref 0.6–1.1)
EGFR: 64 mL/min/{1.73_m2} — AB (ref >90)
GLUCOSE: 141 mg/dL — AB (ref 70–140)
Potassium: 4.2 mEq/L (ref 3.5–5.1)
SODIUM: 137 meq/L (ref 136–145)
TOTAL PROTEIN: 6.3 g/dL — AB (ref 6.4–8.3)

## 2015-09-16 LAB — CBC WITH DIFFERENTIAL/PLATELET
BASO%: 0.6 % (ref 0.0–2.0)
Basophils Absolute: 0.1 10*3/uL (ref 0.0–0.1)
EOS%: 0 % (ref 0.0–7.0)
Eosinophils Absolute: 0 10*3/uL (ref 0.0–0.5)
HCT: 41.3 % (ref 34.8–46.6)
HGB: 13.7 g/dL (ref 11.6–15.9)
LYMPH%: 21 % (ref 14.0–49.7)
MCH: 28.4 pg (ref 25.1–34.0)
MCHC: 33.1 g/dL (ref 31.5–36.0)
MCV: 85.6 fL (ref 79.5–101.0)
MONO#: 0.8 10*3/uL (ref 0.1–0.9)
MONO%: 6.4 % (ref 0.0–14.0)
NEUT%: 72 % (ref 38.4–76.8)
NEUTROS ABS: 9.1 10*3/uL — AB (ref 1.5–6.5)
PLATELETS: 308 10*3/uL (ref 145–400)
RBC: 4.82 10*6/uL (ref 3.70–5.45)
RDW: 13.8 % (ref 11.2–14.5)
WBC: 12.6 10*3/uL — AB (ref 3.9–10.3)
lymph#: 2.7 10*3/uL (ref 0.9–3.3)

## 2015-09-16 MED ORDER — SODIUM CHLORIDE 0.9 % IV SOLN
Freq: Once | INTRAVENOUS | Status: AC
  Administered 2015-09-16: 12:00:00 via INTRAVENOUS

## 2015-09-16 MED ORDER — SODIUM CHLORIDE 0.9 % IV SOLN
Freq: Once | INTRAVENOUS | Status: AC
  Administered 2015-09-16: 12:00:00 via INTRAVENOUS
  Filled 2015-09-16: qty 8

## 2015-09-16 MED ORDER — SODIUM CHLORIDE 0.9 % IV SOLN
500.0000 mg/m2 | Freq: Once | INTRAVENOUS | Status: AC
  Administered 2015-09-16: 900 mg via INTRAVENOUS
  Filled 2015-09-16: qty 36

## 2015-09-16 MED ORDER — SODIUM CHLORIDE 0.9 % IV SOLN
410.0000 mg | Freq: Once | INTRAVENOUS | Status: AC
  Administered 2015-09-16: 410 mg via INTRAVENOUS
  Filled 2015-09-16: qty 41

## 2015-09-16 NOTE — Progress Notes (Signed)
Introduced myself to pt in person as Financial Advocate. Gave pt my card for any financial questions or concerns. Asked pt if she was interested in signing up for [employer] for copay assistance for Alimta and Taxol. Pt and spouse unsure if they have a deductible or have met it. They will check and give me a call but so far have been able to manage their OOP expenses they have been left with and states they have not been left with much to pay.  °

## 2015-09-16 NOTE — Patient Instructions (Signed)
[facility] Discharge Instructions for Patients Receiving Chemotherapy  Today you received the following chemotherapy agents alimta/carboplatin.    To help prevent nausea and vomiting after your treatment, we encourage you to take your nausea medication as directed.     If you develop nausea and vomiting that is not controlled by your nausea medication, call the clinic.   BELOW ARE SYMPTOMS THAT SHOULD BE REPORTED IMMEDIATELY:  *FEVER GREATER THAN 100.5 F  *CHILLS WITH OR WITHOUT FEVER  NAUSEA AND VOMITING THAT IS NOT CONTROLLED WITH YOUR NAUSEA MEDICATION  *UNUSUAL SHORTNESS OF BREATH  *UNUSUAL BRUISING OR BLEEDING  TENDERNESS IN MOUTH AND THROAT WITH OR WITHOUT PRESENCE OF ULCERS  *URINARY PROBLEMS  *BOWEL PROBLEMS  UNUSUAL RASH Items with * indicate a potential emergency and should be followed up as soon as possible.  Feel free to call the clinic you have any questions or concerns. The clinic phone number is [phone].   Pemetrexed injection What is this medicine? PEMETREXED (PEM e TREX ed) is a chemotherapy drug. This medicine affects cells that are rapidly growing, such as cancer cells and cells in your mouth and stomach. It is usually used to treat lung cancers like non-small cell lung cancer and mesothelioma. It may also be used to treat other cancers. This medicine may be used for other purposes; ask your health care provider or pharmacist if you have questions. COMMON BRAND NAME(S): Alimta What should I tell my health care provider before I take this medicine? They need to know if you have any of these conditions: -if you frequently drink alcohol containing beverages -infection (especially a virus infection such as chickenpox, cold sores, or herpes) -kidney disease -liver disease -low blood counts, like low platelets, red bloods, or white blood cells -an unusual or allergic reaction to pemetrexed, mannitol, other medicines, foods, dyes, or  preservatives -pregnant or trying to get pregnant -breast-feeding How should I use this medicine? This drug is given as an infusion into a vein. It is administered in a hospital or clinic by a specially trained health care professional. Talk to your pediatrician regarding the use of this medicine in children. Special care may be needed. Overdosage: If you think you have taken too much of this medicine contact a poison control center or emergency room at once. NOTE: This medicine is only for you. Do not share this medicine with others. What if I miss a dose? It is important not to miss your dose. Call your doctor or health care professional if you are unable to keep an appointment. What may interact with this medicine? -aspirin and aspirin-like medicines -medicines to increase blood counts like filgrastim, pegfilgrastim, sargramostim -methotrexate -NSAIDS, medicines for pain and inflammation, like ibuprofen or naproxen -probenecid -pyrimethamine -vaccines Talk to your doctor or health care professional before taking any of these medicines: -acetaminophen -aspirin -ibuprofen -ketoprofen -naproxen This list may not describe all possible interactions. Give your health care provider a list of all the medicines, herbs, non-prescription drugs, or dietary supplements you use. Also tell them if you smoke, drink alcohol, or use illegal drugs. Some items may interact with your medicine. What should I watch for while using this medicine? Visit your doctor for checks on your progress. This drug may make you feel generally unwell. This is not uncommon, as chemotherapy can affect healthy cells as well as cancer cells. Report any side effects. Continue your course of treatment even though you feel ill unless your doctor tells you to stop. In   some cases, you may be given additional medicines to help with side effects. Follow all directions for their use. Call your doctor or health care professional for  advice if you get a fever, chills or sore throat, or other symptoms of a cold or flu. Do not treat yourself. This drug decreases your body's ability to fight infections. Try to avoid being around people who are sick. This medicine may increase your risk to bruise or bleed. Call your doctor or health care professional if you notice any unusual bleeding. Be careful brushing and flossing your teeth or using a toothpick because you may get an infection or bleed more easily. If you have any dental work done, tell your dentist you are receiving this medicine. Avoid taking products that contain aspirin, acetaminophen, ibuprofen, naproxen, or ketoprofen unless instructed by your doctor. These medicines may hide a fever. Call your doctor or health care professional if you get diarrhea or mouth sores. Do not treat yourself. To protect your kidneys, drink water or other fluids as directed while you are taking this medicine. Men and women must use effective birth control while taking this medicine. You may also need to continue using effective birth control for a time after stopping this medicine. Do not become pregnant while taking this medicine. Tell your doctor right away if you think that you or your partner might be pregnant. There is a potential for serious side effects to an unborn child. Talk to your health care professional or pharmacist for more information. Do not breast-feed an infant while taking this medicine. This medicine may lower sperm counts. What side effects may I notice from receiving this medicine? Side effects that you should report to your doctor or health care professional as soon as possible: -allergic reactions like skin rash, itching or hives, swelling of the face, lips, or tongue -low blood counts - this medicine may decrease the number of white blood cells, red blood cells and platelets. You may be at increased risk for infections and bleeding. -signs of infection - fever or chills,  cough, sore throat, pain or difficulty passing urine -signs of decreased platelets or bleeding - bruising, pinpoint red spots on the skin, black, tarry stools, blood in the urine -signs of decreased red blood cells - unusually weak or tired, fainting spells, lightheadedness -breathing problems, like a dry cough -changes in emotions or moods -chest pain -confusion -diarrhea -high blood pressure -mouth or throat sores or ulcers -pain, swelling, warmth in the leg -pain on swallowing -swelling of the ankles, feet, hands -trouble passing urine or change in the amount of urine -vomiting -yellowing of the eyes or skin Side effects that usually do not require medical attention (report to your doctor or health care professional if they continue or are bothersome): -hair loss -loss of appetite -nausea -stomach upset This list may not describe all possible side effects. Call your doctor for medical advice about side effects. You may report side effects to FDA at 1-800-FDA-1088. Where should I keep my medicine? This drug is given in a hospital or clinic and will not be stored at home. NOTE: This sheet is a summary. It may not cover all possible information. If you have questions about this medicine, talk to your doctor, pharmacist, or health care provider.  2015, [employer]. (2008-07-07 13:24:03)   Carboplatin injection What is this medicine? CARBOPLATIN (KAR boe pla tin) is a chemotherapy drug. It targets fast dividing cells, like cancer cells, and causes these cells to die. This   medicine is used to treat ovarian cancer and many other cancers. This medicine may be used for other purposes; ask your health care provider or pharmacist if you have questions. COMMON BRAND NAME(S): Paraplatin What should I tell my health care provider before I take this medicine? They need to know if you have any of these conditions: -blood disorders -hearing problems -kidney disease -recent or  ongoing radiation therapy -an unusual or allergic reaction to carboplatin, cisplatin, other chemotherapy, other medicines, foods, dyes, or preservatives -pregnant or trying to get pregnant -breast-feeding How should I use this medicine? This drug is usually given as an infusion into a vein. It is administered in a hospital or clinic by a specially trained health care professional. Talk to your pediatrician regarding the use of this medicine in children. Special care may be needed. Overdosage: If you think you have taken too much of this medicine contact a poison control center or emergency room at once. NOTE: This medicine is only for you. Do not share this medicine with others. What if I miss a dose? It is important not to miss a dose. Call your doctor or health care professional if you are unable to keep an appointment. What may interact with this medicine? -medicines for seizures -medicines to increase blood counts like filgrastim, pegfilgrastim, sargramostim -some antibiotics like amikacin, gentamicin, neomycin, streptomycin, tobramycin -vaccines Talk to your doctor or health care professional before taking any of these medicines: -acetaminophen -aspirin -ibuprofen -ketoprofen -naproxen This list may not describe all possible interactions. Give your health care provider a list of all the medicines, herbs, non-prescription drugs, or dietary supplements you use. Also tell them if you smoke, drink alcohol, or use illegal drugs. Some items may interact with your medicine. What should I watch for while using this medicine? Your condition will be monitored carefully while you are receiving this medicine. You will need important blood work done while you are taking this medicine. This drug may make you feel generally unwell. This is not uncommon, as chemotherapy can affect healthy cells as well as cancer cells. Report any side effects. Continue your course of treatment even though you feel ill  unless your doctor tells you to stop. In some cases, you may be given additional medicines to help with side effects. Follow all directions for their use. Call your doctor or health care professional for advice if you get a fever, chills or sore throat, or other symptoms of a cold or flu. Do not treat yourself. This drug decreases your body's ability to fight infections. Try to avoid being around people who are sick. This medicine may increase your risk to bruise or bleed. Call your doctor or health care professional if you notice any unusual bleeding. Be careful brushing and flossing your teeth or using a toothpick because you may get an infection or bleed more easily. If you have any dental work done, tell your dentist you are receiving this medicine. Avoid taking products that contain aspirin, acetaminophen, ibuprofen, naproxen, or ketoprofen unless instructed by your doctor. These medicines may hide a fever. Do not become pregnant while taking this medicine. Women should inform their doctor if they wish to become pregnant or think they might be pregnant. There is a potential for serious side effects to an unborn child. Talk to your health care professional or pharmacist for more information. Do not breast-feed an infant while taking this medicine. What side effects may I notice from receiving this medicine? Side effects that you should report   to your doctor or health care professional as soon as possible: -allergic reactions like skin rash, itching or hives, swelling of the face, lips, or tongue -signs of infection - fever or chills, cough, sore throat, pain or difficulty passing urine -signs of decreased platelets or bleeding - bruising, pinpoint red spots on the skin, black, tarry stools, nosebleeds -signs of decreased red blood cells - unusually weak or tired, fainting spells, lightheadedness -breathing problems -changes in hearing -changes in vision -chest pain -high blood pressure -low  blood counts - This drug may decrease the number of white blood cells, red blood cells and platelets. You may be at increased risk for infections and bleeding. -nausea and vomiting -pain, swelling, redness or irritation at the injection site -pain, tingling, numbness in the hands or feet -problems with balance, talking, walking -trouble passing urine or change in the amount of urine Side effects that usually do not require medical attention (report to your doctor or health care professional if they continue or are bothersome): -hair loss -loss of appetite -metallic taste in the mouth or changes in taste This list may not describe all possible side effects. Call your doctor for medical advice about side effects. You may report side effects to FDA at 1-800-FDA-1088. Where should I keep my medicine? This drug is given in a hospital or clinic and will not be stored at home. NOTE: This sheet is a summary. It may not cover all possible information. If you have questions about this medicine, talk to your doctor, pharmacist, or health care provider.  2015, [employer] Standard. (2008-03-10 14:38:05)  

## 2015-09-16 NOTE — Op Note (Signed)
NAME:  Deborah [name], Deborah [name]          ACCOUNT NO.:  [id number]  MEDICAL RECORD NO.:  [id number]  LOCATION:  [facility]                         FACILITY:  [facility]  PHYSICIAN:  Revonda Standard. Roxan Hockey, M.D.DATE OF BIRTH:  1946/05/18  DATE OF PROCEDURE:  09/15/2015 DATE OF DISCHARGE:                              OPERATIVE REPORT   PREOPERATIVE DIAGNOSIS:  Recurrent right pleural effusion.  POSTOPERATIVE DIAGNOSIS:  Recurrent right pleural effusion.  PROCEDURE:  Right pleural catheter placement.  SURGEON:  Revonda Standard. Roxan Hockey, M.D.  ASSISTANT:  None.  ANESTHESIA:  Local with intravenous sedation.  FINDINGS:  1.5 L of serous fluid evacuated.  Catheter in good position by fluoroscopy.  Effusion resolved by fluoroscopy.  CLINICAL NOTE:  Deborah [name] is a 69 year old woman with newly diagnosed stage IV lung cancer who has a recurrent right pleural effusion.  She was advised to undergo pleural catheter placement for management of the effusion.  The indications, risks, benefits, and alternatives were discussed in detail with the patient.  She understood and accepted the risks and agreed to proceed.  She did understand there would be an indwelling catheter that would require ongoing care and maintenance.  DESCRIPTION OF PROCEDURE:  Deborah [name] was brought to the operating room on September 15, 2015.  She was given intravenous antibiotics.  She was given intravenous sedation and monitored by the Anesthesia Service.  The right chest was prepped and draped in the usual sterile fashion. A total of 10 ml of 1% lidocaine was used for local anesthesia.  The skin at the entry and exit sites was anesthetized, and then the subcutaneous tract between the two was anesthetized as well and finally, the site for the chest entry was anesthetized.  After ensuring adequate sedation and local anesthetic effect, an incision was made at the entry site in the lateral chest wall.  A needle was passed into  the pleural space and a wire was advanced into the pleural space. Fluoroscopy confirmed positioning.  An incision was made at the exit site and the catheter was tunneled from the exit site to the entry site. The cuff on the catheter was left just below the skin at the exit site.  The tract over the wire was dilated.  A peel-away sheath introducer was placed and the inner cannula was removed.  The catheter was advanced through the introducer and the sheath was removed.  The catheter position was confirmed in the chest with fluoroscopy.  The catheter was placed to suction, and 1.5 L of serous fluid was evacuated.  The patient tolerated this well.  Fluoroscopy confirmed resolution of the effusion.  The catheter was secured at the exit site with a 3-0 nylon suture.  The incision at the entry site was closed with a 3-0 Vicryl subcuticular suture.  Dermabond was applied. The catheter was capped and dressed.  The patient was taken from the operating room to the postanesthetic care unit in good condition.     Revonda Standard Roxan Hockey, M.D.     SCH/MEDQ  D:  09/15/2015  T:  09/16/2015  Job:  [id number]

## 2015-09-17 DIAGNOSIS — I10 Essential (primary) hypertension: Secondary | ICD-10-CM | POA: Diagnosis not present

## 2015-09-17 DIAGNOSIS — F419 Anxiety disorder, unspecified: Secondary | ICD-10-CM | POA: Diagnosis not present

## 2015-09-17 DIAGNOSIS — J90 Pleural effusion, not elsewhere classified: Secondary | ICD-10-CM | POA: Diagnosis not present

## 2015-09-20 ENCOUNTER — Telehealth: Payer: Self-pay | Admitting: Medical Oncology

## 2015-09-20 DIAGNOSIS — J90 Pleural effusion, not elsewhere classified: Secondary | ICD-10-CM | POA: Diagnosis not present

## 2015-09-20 DIAGNOSIS — I10 Essential (primary) hypertension: Secondary | ICD-10-CM | POA: Diagnosis not present

## 2015-09-20 DIAGNOSIS — R112 Nausea with vomiting, unspecified: Secondary | ICD-10-CM

## 2015-09-20 DIAGNOSIS — F419 Anxiety disorder, unspecified: Secondary | ICD-10-CM | POA: Diagnosis not present

## 2015-09-20 DIAGNOSIS — T451X5A Adverse effect of antineoplastic and immunosuppressive drugs, initial encounter: Principal | ICD-10-CM

## 2015-09-20 MED ORDER — ONDANSETRON HCL 8 MG PO TABS: 8.0000 mg | ORAL_TABLET | Freq: Three times a day (TID) | ORAL | Status: DC | PRN

## 2015-09-20 NOTE — Telephone Encounter (Signed)
Had a few good days after chemo until Sunday she was very nauseated and could not get up out of bed all day. She did drink some ginger ale, and 1/2 potato. She took compazine twice . Today she is not as nauseated but does not want to eat much except bland food. Per Deborah [name] , Zofran ordered.

## 2015-09-22 ENCOUNTER — Telehealth: Payer: Self-pay | Admitting: Pulmonary Disease

## 2015-09-22 ENCOUNTER — Encounter (HOSPITAL_COMMUNITY): Payer: Self-pay

## 2015-09-22 DIAGNOSIS — J90 Pleural effusion, not elsewhere classified: Secondary | ICD-10-CM | POA: Diagnosis not present

## 2015-09-22 DIAGNOSIS — I10 Essential (primary) hypertension: Secondary | ICD-10-CM | POA: Diagnosis not present

## 2015-09-22 DIAGNOSIS — F419 Anxiety disorder, unspecified: Secondary | ICD-10-CM | POA: Diagnosis not present

## 2015-09-22 NOTE — Telephone Encounter (Signed)
Called and spoke with Clara from [facility] stated that she already received pathology report and does not need anything from office  Nothing further is needed at this time

## 2015-09-23 ENCOUNTER — Other Ambulatory Visit (HOSPITAL_BASED_OUTPATIENT_CLINIC_OR_DEPARTMENT_OTHER): Payer: Medicare Other

## 2015-09-23 ENCOUNTER — Encounter: Payer: Self-pay | Admitting: Internal Medicine

## 2015-09-23 ENCOUNTER — Ambulatory Visit (HOSPITAL_BASED_OUTPATIENT_CLINIC_OR_DEPARTMENT_OTHER): Payer: Medicare Other | Admitting: Internal Medicine

## 2015-09-23 VITALS — BP 119/77 | HR 119 | Temp 97.5°F | Resp 17 | Ht 69.0 in | Wt 140.8 lb

## 2015-09-23 DIAGNOSIS — J910 Malignant pleural effusion: Secondary | ICD-10-CM | POA: Diagnosis not present

## 2015-09-23 DIAGNOSIS — R5383 Other fatigue: Secondary | ICD-10-CM | POA: Diagnosis not present

## 2015-09-23 DIAGNOSIS — R110 Nausea: Secondary | ICD-10-CM | POA: Diagnosis not present

## 2015-09-23 DIAGNOSIS — C3491 Malignant neoplasm of unspecified part of right bronchus or lung: Secondary | ICD-10-CM | POA: Diagnosis not present

## 2015-09-23 DIAGNOSIS — T451X5A Adverse effect of antineoplastic and immunosuppressive drugs, initial encounter: Secondary | ICD-10-CM

## 2015-09-23 LAB — COMPREHENSIVE METABOLIC PANEL (CC13)
ALBUMIN: 3 g/dL — AB (ref 3.5–5.0)
ALK PHOS: 104 U/L (ref 40–150)
ALT: 13 U/L (ref 0–55)
ANION GAP: 9 meq/L (ref 3–11)
AST: 40 U/L — ABNORMAL HIGH (ref 5–34)
BILIRUBIN TOTAL: 0.69 mg/dL (ref 0.20–1.20)
BUN: 14.4 mg/dL (ref 7.0–26.0)
CALCIUM: 9.2 mg/dL (ref 8.4–10.4)
CO2: 26 mEq/L (ref 22–29)
Chloride: 99 mEq/L (ref 98–109)
Creatinine: 0.8 mg/dL (ref 0.6–1.1)
EGFR: 73 mL/min/{1.73_m2} — AB (ref >90)
GLUCOSE: 140 mg/dL (ref 70–140)
Potassium: 4 mEq/L (ref 3.5–5.1)
SODIUM: 134 meq/L — AB (ref 136–145)
TOTAL PROTEIN: 5.8 g/dL — AB (ref 6.4–8.3)

## 2015-09-23 LAB — CBC WITH DIFFERENTIAL/PLATELET
BASO%: 1.3 % (ref 0.0–2.0)
Basophils Absolute: 0.1 10*3/uL (ref 0.0–0.1)
EOS ABS: 0.2 10*3/uL (ref 0.0–0.5)
EOS%: 2.3 % (ref 0.0–7.0)
HEMATOCRIT: 40.6 % (ref 34.8–46.6)
HEMOGLOBIN: 13.8 g/dL (ref 11.6–15.9)
LYMPH#: 3.1 10*3/uL (ref 0.9–3.3)
LYMPH%: 47.1 % (ref 14.0–49.7)
MCH: 28.4 pg (ref 25.1–34.0)
MCHC: 33.9 g/dL (ref 31.5–36.0)
MCV: 83.8 fL (ref 79.5–101.0)
MONO#: 0.6 10*3/uL (ref 0.1–0.9)
MONO%: 8.7 % (ref 0.0–14.0)
NEUT%: 40.6 % (ref 38.4–76.8)
NEUTROS ABS: 2.7 10*3/uL (ref 1.5–6.5)
PLATELETS: 204 10*3/uL (ref 145–400)
RBC: 4.84 10*6/uL (ref 3.70–5.45)
RDW: 13.4 % (ref 11.2–14.5)
WBC: 6.5 10*3/uL (ref 3.9–10.3)

## 2015-09-23 NOTE — Progress Notes (Signed)
[place] [phone]   [phone]  OFFICE PROGRESS NOTE  [name], MD [place] [address]  DIAGNOSIS: Stage IV (T1b, N2, M1 A) non-small cell lung cancer, adenocarcinoma with negative EGFR mutation, negative ALK gene translocation, presenting with right upper lobe lung mass in addition to mediastinal lymphadenopathy and malignant pleural effusion as well as innumerable bilateral pulmonary nodules.  PRIOR THERAPY: None  CURRENT THERAPY: Systemic chemotherapy with carboplatin for AUC of 5 and Alimta 500 MG/M2 every 3 weeks. First dose on 09/16/2015.  INTERVAL HISTORY: Deborah [name] 69 y.o. female returns to the clinic today for follow-up visit accompanied by her husband. The patient was started the first cycle of systemic chemotherapy with carboplatin and Alimta last week. She is tolerating her treatment well except for mild fatigue and few episodes of nausea. She is currently on several on antiemetics including Compazine, Zofran in addition to Ativan. Her molecular studies performed recently showed no evidence for EGFR mutation or ALK gene translocation. She denied having any significant fever or chills, no weight loss or night sweats. The patient denied having any significant chest pain but continues to have shortness of breath with exertion. No cough or hemoptysis. She had a Pleurx catheter placed by Dr. Roxan Hockey and she drains between 300-400 ml of pleural fluid few times a week.   MEDICAL HISTORY: Past Medical History  Diagnosis Date  . Allergy     Mold  . Hyperlipidemia 08/04/2011  . Hypothyroidism 08/04/2011  . Atrophic vaginitis   . 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 Basal cell carcinoma of skin 2010  . Osteopenia 10/2012    T score -1.7 FRAX 7%/0.3%  . Vertigo   . Non-small cell lung cancer [facility]) September 2016    Adenocarcinoma  . Shortness of  breath dyspnea   . Meniere's disease     ALLERGIES:  has No Known Allergies.  MEDICATIONS:  Current Outpatient Prescriptions  Medication Sig Dispense Refill  . Cholecalciferol (VITAMIN D-3) 1000 UNITS CAPS Take 1 capsule by mouth daily.     Marland Kitchen dexamethasone (DECADRON) 4 MG tablet 4 mg po bid the day before, day of and day after chemo 40 tablet 1  . folic acid (FOLVITE) 1 MG tablet Take 1 tablet (1 mg total) by mouth daily. 30 tablet 3  . levothyroxine (SYNTHROID, LEVOTHROID) 75 MCG tablet TAKE 1 TABLET BY MOUTH DAILY. 90 tablet 3  . LORazepam (ATIVAN) 0.5 MG tablet Take 1 tablet (0.5 mg total) by mouth every 8 (eight) hours. (Patient taking differently: Take 0.5 mg by mouth every 8 (eight) hours as needed for anxiety (Pt prefers to take at bedtime as needed). ) 30 tablet 0  . ondansetron (ZOFRAN) 8 MG tablet Take 1 tablet (8 mg total) by mouth every 8 (eight) hours as needed for nausea or vomiting. 20 tablet 0  . prochlorperazine (COMPAZINE) 10 MG tablet Take 1 tablet (10 mg total) by mouth every 6 (six) hours as needed for nausea or vomiting. 30 tablet 0  . traMADol (ULTRAM) 50 MG tablet Take 1-2 tablets (50-100 mg total) by mouth every 6 (six) hours as needed (pain). 30 tablet 1  . triamterene-hydrochlorothiazide (DYAZIDE) 37.5-25 MG per capsule Take 1 capsule by mouth daily as needed.     Marland Kitchen albuterol (PROVENTIL HFA;VENTOLIN HFA) 108 (90 BASE) MCG/ACT inhaler Inhale 2 puffs into the lungs  every 6 (six) hours as needed for wheezing or shortness of breath. (Patient not taking: Reported on 09/23/2015) 1 Inhaler 1   No current facility-administered medications for this visit.    SURGICAL HISTORY:  Past Surgical History  Procedure Laterality Date  . Tonsillectomy  1952  . Vaginal hysterectomy  1980  . Breast biopsy  1999    RADIATION TREATMENTS  . Breast lumpectomy Right 1998    right  . Chest tube insertion Right 09/15/2015    Procedure: INSERTION RIGHT PLEURAL DRAINAGE CATHETER;   Surgeon: Melrose Nakayama, MD;  Location: [facility];  Service: Thoracic;  Laterality: Right;    REVIEW OF SYSTEMS:  Constitutional: positive for fatigue Eyes: negative Ears, nose, mouth, throat, and face: negative Respiratory: positive for cough, dyspnea on exertion and pleurisy/chest pain Cardiovascular: negative Gastrointestinal: negative Genitourinary:negative Integument/breast: negative Hematologic/lymphatic: negative Musculoskeletal:negative Neurological: negative Behavioral/Psych: negative Endocrine: negative Allergic/Immunologic: negative   PHYSICAL EXAMINATION: General appearance: alert, cooperative, fatigued and no distress Head: Normocephalic, without obvious abnormality, atraumatic Neck: no adenopathy, no JVD, supple, symmetrical, trachea midline and thyroid not enlarged, symmetric, no tenderness/mass/nodules Lymph nodes: Cervical, supraclavicular, and axillary nodes normal. Resp: diminished breath sounds RLL and dullness to percussion RLL Back: symmetric, no curvature. ROM normal. No CVA tenderness. Cardio: regular rate and rhythm, S1, S2 normal, no murmur, click, rub or gallop GI: soft, non-tender; bowel sounds normal; no masses,  no organomegaly Extremities: extremities normal, atraumatic, no cyanosis or edema Neurologic: Alert and oriented X 3, normal strength and tone. Normal symmetric reflexes. Normal coordination and gait  ECOG PERFORMANCE STATUS: 1 - Symptomatic but completely ambulatory  Blood pressure 119/77, pulse 119, temperature 97.5 F (36.4 C), temperature source Oral, resp. rate 17, height $RemoveBe'5\' 9"'iBFijpgOX$  (1.753 m), weight 140 lb 12.8 oz (63.866 kg), last menstrual period 10/11/1979, SpO2 97 %.  LABORATORY DATA: Lab Results  Component Value Date   WBC 6.5 09/23/2015   HGB 13.8 09/23/2015   HCT 40.6 09/23/2015   MCV 83.8 09/23/2015   PLT 204 09/23/2015      Chemistry      Component Value Date/Time   NA 137 09/16/2015 0929   NA 139 09/15/2015 1210   K  4.2 09/16/2015 0929   K 4.5 09/15/2015 1210   CL 107 09/15/2015 1210   CO2 23 09/16/2015 0929   CO2 23 09/15/2015 1210   BUN 18.5 09/16/2015 0929   BUN 13 09/15/2015 1210   CREATININE 0.9 09/16/2015 0929   CREATININE 0.86 09/15/2015 1210      Component Value Date/Time   CALCIUM 9.7 09/16/2015 0929   CALCIUM 9.5 09/15/2015 1210   ALKPHOS 107 09/16/2015 0929   ALKPHOS 95 09/15/2015 1210   AST 31 09/16/2015 0929   AST 41 09/15/2015 1210   ALT 9 09/16/2015 0929   ALT 12* 09/15/2015 1210   BILITOT 0.58 09/16/2015 0929   BILITOT 0.7 09/15/2015 1210       RADIOGRAPHIC STUDIES: Dg Chest 1 View  08/24/2015   CLINICAL DATA:  Post thoracentesis  EXAM: CHEST  1 VIEW  COMPARISON:  Chest x-ray of 08/16/2015 and CT chest of 08/18/2015  FINDINGS: Some of the right pleural effusion has been evacuated after right thoracentesis. No pneumothorax is seen. Abnormal opacity remains at the right lung base representing infiltrate, atelectasis, or possibly tumor. The multiple pulmonary nodules noted throughout the lungs on CT of the chest are not as well appreciated by chest x-ray, consistent with diffuse pulmonary metastases. Heart size is stable.  IMPRESSION: Some  decrease in the volume of the right pleural effusion after right thoracentesis. No pneumothorax.   Electronically Signed   By: Dwyane Dee M.D.   On: 08/24/2015 12:28   Dg Chest 2 View  09/15/2015   CLINICAL DATA:  Non-small cell lung carcinoma. Pleural drainage catheter placement.  EXAM: CHEST  2 VIEW  COMPARISON:  PET CT scan 09/01/2015. PA and lateral chest 08/25/2015.  FINDINGS: No pleural drainage catheter is identified. The patient has a moderate right pleural effusion with associated basilar airspace disease. The effusion appears somewhat increased compared to the prior plain films. Right hilar mass is again identified. There are innumerable small bilateral pulmonary nodules. No focal bony abnormality is identified.  IMPRESSION: No pleural  drainage catheter is identified. Small to moderate right pleural effusion appears increased compared to the prior plain films.  Right hilar mass and innumerable bilateral pulmonary nodules.   Electronically Signed   By: Drusilla Kanner M.D.   On: 09/15/2015 12:08   Dg Chest 2 View  08/25/2015   CLINICAL DATA:  Follow-up right pleural effusion following thoracentesis yesterday, persistent cough and shortness of breath.  EXAM: CHEST  2 VIEW  COMPARISON:  Chest x-ray of August 24, 2015 performed immediately following right thoracentesis.  FINDINGS: The lungs are well-expanded. A slight increase in the volume of pleural fluid on the right has developed. There are assistant areas of increased density in the right infrahilar region. There are coarse nodular densities in the lower lobe on the left which are stable. The cardiac silhouette is top-normal in size. The pulmonary vascularity is not engorged. The mediastinum is normal in width. There is mild degenerative disc disease of the thoracic spine.  IMPRESSION: There has been slight interval increase in the volume of the right-sided pleural effusion which would still be considered small. There is persistent right middle lobe atelectasis, and there are stable coarse nodular lung markings at both bases.   Electronically Signed   By: David  [place] M.D.   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 Assess for brain metastases. Staging.  EXAM: MRI HEAD WITHOUT AND WITH CONTRAST  TECHNIQUE: Multiplanar, multiecho pulse sequences of the brain and surrounding structures were obtained without and with intravenous contrast.  CONTRAST:  64mL MULTIHANCE GADOBENATE DIMEGLUMINE 529 MG/ML IV SOLN  COMPARISON:  07/15/2013  FINDINGS: Diffusion imaging does not show any acute infarction or other cause of restricted diffusion.  In the right cerebellum, there is an incidental venous  angioma without evidence of previous hemorrhage. There is no evidence of metastatic disease affecting the brain, skull or skullbase. There is no evidence of old or acute infarction, hydrocephalus or extra-axial collection. No other abnormal enhancement. No pituitary mass. No inflammatory sinus disease.  IMPRESSION: Normal study.  No evidence of metastatic disease.   Electronically Signed   By: Paulina Fusi M.D.   On: 09/08/2015 14:36   Nm Pet Image Initial (pi) Skull Base To Thigh  09/01/2015   CLINICAL DATA:  Pleural metastasis at RIGHT lung base. Treatment strategy for small cell lung cancer.  EXAM: NUCLEAR MEDICINE PET SKULL BASE TO THIGH  TECHNIQUE: 7.2 mCi F-18 FDG was injected intravenously. Full-ring PET imaging was performed from the skull base to thigh after the radiotracer. CT data was obtained and used for attenuation correction and anatomic localization.  FASTING BLOOD GLUCOSE:  Value: 120 mg/dl  COMPARISON:  CT 08/18/2015  FINDINGS: NECK  Bilateral small hypermetabolic supraclavicular lymph node. Example 9 mm RIGHT supraclavicular lymph node on image 40, series 4. 11 mm RIGHT supraclavicular node on image 46 with SUV max equal 7.9  CHEST  There is ill-defined masslike hypermetabolic tissue at the RIGHT hilum with SUV max equal 10.37 on image 76 of the fused data set. Rounded mass along the fissure measuring 3.7 x 4.6 cm on image 75 has lower metabolic activity.  Small bilateral pulmonary nodules have associated metabolic activity is noted in the LEFT lower lobe with SUV max equal 8.2.  There is intensely hypermetabolic small LEFT axillary lymph node measuring only 6 mm with SUV max equal 25. Hypermetabolic LEFT internal mammary lymph node on image 61, series 4.  There is moderate size RIGHT pleural effusion which is slightly decreased volume from comparison CT scan  There several malignant nodules along the pleural of the inferior RIGHT hemi thorax  ABDOMEN/PELVIS  No abnormal metabolic activity  liver spleen. Normal glands. The pancreas no hypermetabolic abdominal pelvic adenopathy  SKELETON  No focal hypermetabolic activity to suggest skeletal metastasis.  IMPRESSION: 1. Infiltrative ill-defined mass at the RIGHT hilum with intense metabolic activity. 2. Multiple bilateral pulmonary nodules with associated metabolic activity consistent with metastasis. 3. Rounded mass along the oblique fissure may represent rounded atelectasis as metabolic activity is less than the hilar activity. 4. Metastatic adenopathy including bilateral supraclavicular nodes, LEFT internal mammary node, and LEFT axillary node. The LEFT axillary node is a typically intensely hypermetabolic for size. 5. Pleural metastasis at the RIGHT lung base. 6. Moderate RIGHT pleural effusion is slightly decreased from comparison exam.   Electronically Signed   By: Suzy Bouchard M.D.   On: 09/01/2015 10:28   Dg C-arm 1-60 Min-no Report  09/15/2015   CLINICAL DATA: Surgery   C-ARM 1-60 MINUTES  Fluoroscopy was utilized by the requesting physician.  No radiographic  interpretation.    US Thoracentesis Asp Pleural Space W/img Guide  08/24/2015   INDICATION: Symptomatic right sided pleural effusion  EXAM: US THORACENTESIS ASP PLEURAL SPACE W/IMG GUIDE  COMPARISON:  CT Chest 08/18/2015.  MEDICATIONS: None  COMPLICATIONS: None immediate  TECHNIQUE: Informed written consent was obtained from the patient after a discussion of the risks, benefits and alternatives to treatment. A timeout was performed prior to the initiation of the procedure.  Initial ultrasound scanning demonstrates a right pleural effusion. The lower chest was prepped and draped in the usual sterile fashion. 1% lidocaine was used for local anesthesia.  An ultrasound image was saved for documentation purposes. A 6 Fr Safe-T-Centesis catheter was introduced. The thoracentesis was performed. The catheter was removed and a dressing was applied. The patient tolerated the procedure well  without immediate post procedural complication. The patient was escorted to have an upright chest radiograph.  FINDINGS: A total of approximately 1.4 liters of serous/blood tinged fluid was removed. Requested samples were sent to the laboratory.  IMPRESSION: Successful ultrasound-guided right sided thoracentesis yielding 1.4 liters of pleural fluid.  Read By:  Tsosie Billing PA-C   Electronically Signed   By: Aletta Edouard M.D.   On: 08/24/2015 13:00    ASSESSMENT AND PLAN: This is a very pleasant 69 years old white female recently diagnosed with stage IV non-small cell lung cancer, adenocarcinoma. Her molecular studies showed no actionable mutations. The patient was started on systemic chemotherapy with carboplatin and Alimta status post 1 week of treatment and tolerated the first week of her  treatment fairly well except for few episodes of nausea. We added few antiemetics to her regimen including Zofran 8 mg by mouth every 8 hours in addition to Ativan when necessary. I recommended for the patient to continue her treatment as scheduled and she will come back for follow-up visit in 2 weeks for reevaluation before starting cycle #2 of her treatment. The patient was advised to call immediately if she has any concerning symptoms in the interval. The patient voices understanding of current disease status and treatment options and is in agreement with the current care plan.  All questions were answered. The patient knows to call the clinic with any problems, questions or concerns. We can certainly see the patient much sooner if necessary.  I spent 15 minutes counseling the patient face to face. The total time spent in the appointment was 25 minutes.  Disclaimer: This note was dictated with voice recognition software. Similar sounding words can inadvertently be transcribed and may not be corrected upon review.

## 2015-09-23 NOTE — Progress Notes (Signed)
Met with pt and spouse briefly regarding follow up for financial assistance after checking with insurance company. Advised pt's spouse to bring proof of income him and spouse and that he could fax or email it to me. Would like to apply to [place] and check to see if qualify for any grants.

## 2015-09-24 DIAGNOSIS — I10 Essential (primary) hypertension: Secondary | ICD-10-CM | POA: Diagnosis not present

## 2015-09-24 DIAGNOSIS — C3492 Malignant neoplasm of unspecified part of left bronchus or lung: Secondary | ICD-10-CM | POA: Diagnosis not present

## 2015-09-24 DIAGNOSIS — J910 Malignant pleural effusion: Secondary | ICD-10-CM | POA: Diagnosis not present

## 2015-09-24 DIAGNOSIS — J90 Pleural effusion, not elsewhere classified: Secondary | ICD-10-CM | POA: Diagnosis not present

## 2015-09-24 DIAGNOSIS — F419 Anxiety disorder, unspecified: Secondary | ICD-10-CM | POA: Diagnosis not present

## 2015-09-27 ENCOUNTER — Other Ambulatory Visit: Payer: Self-pay | Admitting: *Deleted

## 2015-09-27 DIAGNOSIS — I10 Essential (primary) hypertension: Secondary | ICD-10-CM | POA: Diagnosis not present

## 2015-09-27 DIAGNOSIS — J90 Pleural effusion, not elsewhere classified: Secondary | ICD-10-CM | POA: Diagnosis not present

## 2015-09-27 DIAGNOSIS — F419 Anxiety disorder, unspecified: Secondary | ICD-10-CM | POA: Diagnosis not present

## 2015-09-27 MED ORDER — HYDROCODONE-HOMATROPINE 5-1.5 MG/5ML PO SYRP: 5.0000 mL | ORAL_SOLUTION | Freq: Four times a day (QID) | ORAL | Status: DC | PRN

## 2015-09-27 NOTE — Telephone Encounter (Signed)
Pt husband called with request for something for cough, and question about Ativan- if pt can increase dose. Reviewed with MD, Pt's husband will come today to pick up Hycodan rx, discussed with husband pt should take Ativan as prescribed. Rx given to pt husband in waiting room.

## 2015-09-29 ENCOUNTER — Telehealth: Payer: Self-pay | Admitting: *Deleted

## 2015-09-29 DIAGNOSIS — F419 Anxiety disorder, unspecified: Secondary | ICD-10-CM | POA: Diagnosis not present

## 2015-09-29 DIAGNOSIS — C3491 Malignant neoplasm of unspecified part of right bronchus or lung: Secondary | ICD-10-CM

## 2015-09-29 DIAGNOSIS — I10 Essential (primary) hypertension: Secondary | ICD-10-CM | POA: Diagnosis not present

## 2015-09-29 DIAGNOSIS — J90 Pleural effusion, not elsewhere classified: Secondary | ICD-10-CM | POA: Diagnosis not present

## 2015-09-29 MED ORDER — LORAZEPAM 0.5 MG PO TABS: ORAL_TABLET | ORAL | Status: DC

## 2015-09-29 NOTE — Telephone Encounter (Signed)
Patient's spouse called reporting he picked up the hycodan cough syrup but [facility] has no record of the lorazepam order.  Could this be called in and I'll pick up tomorrow."  Advised the previous note discusses dose change and instructions are to take as ordered.  Verbalized understanding.

## 2015-09-30 ENCOUNTER — Other Ambulatory Visit (HOSPITAL_BASED_OUTPATIENT_CLINIC_OR_DEPARTMENT_OTHER): Payer: Medicare Other

## 2015-09-30 ENCOUNTER — Telehealth: Payer: Self-pay | Admitting: *Deleted

## 2015-09-30 DIAGNOSIS — C3491 Malignant neoplasm of unspecified part of right bronchus or lung: Secondary | ICD-10-CM

## 2015-09-30 LAB — CBC WITH DIFFERENTIAL/PLATELET
BASO%: 0.2 % (ref 0.0–2.0)
BASOS ABS: 0 10*3/uL (ref 0.0–0.1)
EOS ABS: 0 10*3/uL (ref 0.0–0.5)
EOS%: 0.8 % (ref 0.0–7.0)
HCT: 34.6 % — ABNORMAL LOW (ref 34.8–46.6)
HGB: 11.6 g/dL (ref 11.6–15.9)
LYMPH%: 42.9 % (ref 14.0–49.7)
MCH: 28.9 pg (ref 25.1–34.0)
MCHC: 33.5 g/dL (ref 31.5–36.0)
MCV: 86.1 fL (ref 79.5–101.0)
MONO#: 1 10*3/uL — AB (ref 0.1–0.9)
MONO%: 18.6 % — ABNORMAL HIGH (ref 0.0–14.0)
NEUT%: 37.5 % — ABNORMAL LOW (ref 38.4–76.8)
NEUTROS ABS: 2 10*3/uL (ref 1.5–6.5)
PLATELETS: 151 10*3/uL (ref 145–400)
RBC: 4.02 10*6/uL (ref 3.70–5.45)
RDW: 13.6 % (ref 11.2–14.5)
WBC: 5.3 10*3/uL (ref 3.9–10.3)
lymph#: 2.3 10*3/uL (ref 0.9–3.3)

## 2015-09-30 LAB — COMPREHENSIVE METABOLIC PANEL (CC13)
ALK PHOS: 102 U/L (ref 40–150)
ALT: 36 U/L (ref 0–55)
ANION GAP: 8 meq/L (ref 3–11)
AST: 61 U/L — ABNORMAL HIGH (ref 5–34)
Albumin: 2.8 g/dL — ABNORMAL LOW (ref 3.5–5.0)
BILIRUBIN TOTAL: 0.36 mg/dL (ref 0.20–1.20)
BUN: 12 mg/dL (ref 7.0–26.0)
CO2: 28 meq/L (ref 22–29)
Calcium: 9.1 mg/dL (ref 8.4–10.4)
Chloride: 103 mEq/L (ref 98–109)
Creatinine: 0.7 mg/dL (ref 0.6–1.1)
EGFR: 82 mL/min/{1.73_m2} — AB (ref >90)
Glucose: 65 mg/dl — ABNORMAL LOW (ref 70–140)
Potassium: 3.8 mEq/L (ref 3.5–5.1)
Sodium: 139 mEq/L (ref 136–145)
TOTAL PROTEIN: 5.6 g/dL — AB (ref 6.4–8.3)

## 2015-09-30 NOTE — Telephone Encounter (Signed)
Call from [facility] from [facility] [phone], pt Pleurx draining 25cc out at last visit.  Orders currently for Pleurx to be drained TID PRN.  RN requesting new orders to continue or any new changes to order. Will review with MD, instructed Tiffany to call Dr. Leonarda Salon office for this order. Tiffany verbalized understanding

## 2015-10-04 DIAGNOSIS — J90 Pleural effusion, not elsewhere classified: Secondary | ICD-10-CM | POA: Diagnosis not present

## 2015-10-04 DIAGNOSIS — F419 Anxiety disorder, unspecified: Secondary | ICD-10-CM | POA: Diagnosis not present

## 2015-10-04 DIAGNOSIS — I10 Essential (primary) hypertension: Secondary | ICD-10-CM | POA: Diagnosis not present

## 2015-10-07 ENCOUNTER — Ambulatory Visit (HOSPITAL_BASED_OUTPATIENT_CLINIC_OR_DEPARTMENT_OTHER): Payer: Medicare Other

## 2015-10-07 ENCOUNTER — Encounter: Payer: Self-pay | Admitting: Internal Medicine

## 2015-10-07 ENCOUNTER — Ambulatory Visit (HOSPITAL_BASED_OUTPATIENT_CLINIC_OR_DEPARTMENT_OTHER): Payer: Medicare Other | Admitting: Internal Medicine

## 2015-10-07 ENCOUNTER — Other Ambulatory Visit (HOSPITAL_BASED_OUTPATIENT_CLINIC_OR_DEPARTMENT_OTHER): Payer: Medicare Other

## 2015-10-07 ENCOUNTER — Telehealth: Payer: Self-pay | Admitting: Internal Medicine

## 2015-10-07 VITALS — BP 135/88 | HR 98 | Temp 98.0°F | Resp 18 | Ht 69.0 in | Wt 141.2 lb

## 2015-10-07 DIAGNOSIS — C3491 Malignant neoplasm of unspecified part of right bronchus or lung: Secondary | ICD-10-CM

## 2015-10-07 DIAGNOSIS — R110 Nausea: Secondary | ICD-10-CM

## 2015-10-07 DIAGNOSIS — Z5111 Encounter for antineoplastic chemotherapy: Secondary | ICD-10-CM

## 2015-10-07 DIAGNOSIS — T451X5A Adverse effect of antineoplastic and immunosuppressive drugs, initial encounter: Secondary | ICD-10-CM

## 2015-10-07 LAB — COMPREHENSIVE METABOLIC PANEL (CC13)
ALT: 33 U/L (ref 0–55)
ANION GAP: 9 meq/L (ref 3–11)
AST: 39 U/L — ABNORMAL HIGH (ref 5–34)
Albumin: 3.4 g/dL — ABNORMAL LOW (ref 3.5–5.0)
Alkaline Phosphatase: 121 U/L (ref 40–150)
BUN: 16.9 mg/dL (ref 7.0–26.0)
CALCIUM: 10.1 mg/dL (ref 8.4–10.4)
CHLORIDE: 103 meq/L (ref 98–109)
CO2: 24 mEq/L (ref 22–29)
Creatinine: 0.8 mg/dL (ref 0.6–1.1)
EGFR: 80 mL/min/{1.73_m2} — AB (ref >90)
Glucose: 110 mg/dl (ref 70–140)
POTASSIUM: 4 meq/L (ref 3.5–5.1)
Sodium: 136 mEq/L (ref 136–145)
Total Bilirubin: 0.36 mg/dL (ref 0.20–1.20)
Total Protein: 6.4 g/dL (ref 6.4–8.3)

## 2015-10-07 LAB — CBC WITH DIFFERENTIAL/PLATELET
BASO%: 1.2 % (ref 0.0–2.0)
BASOS ABS: 0.2 10*3/uL — AB (ref 0.0–0.1)
EOS%: 0 % (ref 0.0–7.0)
Eosinophils Absolute: 0 10*3/uL (ref 0.0–0.5)
HEMATOCRIT: 38.1 % (ref 34.8–46.6)
HGB: 12.9 g/dL (ref 11.6–15.9)
LYMPH#: 5.3 10*3/uL — AB (ref 0.9–3.3)
LYMPH%: 33.3 % (ref 14.0–49.7)
MCH: 28.8 pg (ref 25.1–34.0)
MCHC: 33.9 g/dL (ref 31.5–36.0)
MCV: 85.2 fL (ref 79.5–101.0)
MONO#: 1.3 10*3/uL — AB (ref 0.1–0.9)
MONO%: 8.5 % (ref 0.0–14.0)
NEUT#: 9 10*3/uL — ABNORMAL HIGH (ref 1.5–6.5)
NEUT%: 57 % (ref 38.4–76.8)
PLATELETS: 486 10*3/uL — AB (ref 145–400)
RBC: 4.48 10*6/uL (ref 3.70–5.45)
RDW: 15 % — ABNORMAL HIGH (ref 11.2–14.5)
WBC: 15.9 10*3/uL — ABNORMAL HIGH (ref 3.9–10.3)

## 2015-10-07 LAB — TECHNOLOGIST REVIEW

## 2015-10-07 MED ORDER — SODIUM CHLORIDE 0.9 % IV SOLN
500.0000 mg/m2 | Freq: Once | INTRAVENOUS | Status: AC
  Administered 2015-10-07: 900 mg via INTRAVENOUS
  Filled 2015-10-07: qty 32

## 2015-10-07 MED ORDER — SODIUM CHLORIDE 0.9 % IV SOLN
408.5000 mg | Freq: Once | INTRAVENOUS | Status: AC
  Administered 2015-10-07: 410 mg via INTRAVENOUS
  Filled 2015-10-07: qty 41

## 2015-10-07 MED ORDER — SODIUM CHLORIDE 0.9 % IV SOLN
Freq: Once | INTRAVENOUS | Status: AC
  Administered 2015-10-07: 13:00:00 via INTRAVENOUS

## 2015-10-07 MED ORDER — SODIUM CHLORIDE 0.9 % IV SOLN
Freq: Once | INTRAVENOUS | Status: AC
  Administered 2015-10-07: 13:00:00 via INTRAVENOUS
  Filled 2015-10-07: qty 8

## 2015-10-07 NOTE — Patient Instructions (Signed)
[facility] Discharge Instructions for Patients Receiving Chemotherapy  Today you received the following chemotherapy agents: Alimta and Carboplatin.  To help prevent nausea and vomiting after your treatment, we encourage you to take your nausea medication as directed.   If you develop nausea and vomiting that is not controlled by your nausea medication, call the clinic.   BELOW ARE SYMPTOMS THAT SHOULD BE REPORTED IMMEDIATELY:  *FEVER GREATER THAN 100.5 F  *CHILLS WITH OR WITHOUT FEVER  NAUSEA AND VOMITING THAT IS NOT CONTROLLED WITH YOUR NAUSEA MEDICATION  *UNUSUAL SHORTNESS OF BREATH  *UNUSUAL BRUISING OR BLEEDING  TENDERNESS IN MOUTH AND THROAT WITH OR WITHOUT PRESENCE OF ULCERS  *URINARY PROBLEMS  *BOWEL PROBLEMS  UNUSUAL RASH Items with * indicate a potential emergency and should be followed up as soon as possible.  Feel free to call the clinic you have any questions or concerns. The clinic phone number is [phone].  Please show the CHEMO ALERT CARD at check-in to the Emergency Department and triage nurse.   

## 2015-10-07 NOTE — Telephone Encounter (Signed)
Added dec appts.Marland KitchenMarland Kitchen

## 2015-10-07 NOTE — Progress Notes (Signed)
Enrolled pt in [place]. Pt approved from 10/07/15-12/18/15 for Alimta and Carboplatin in the amount of $7,000. Pt responsibility of $25 for these medications. Copy of card will be sent to billing and one copy to pt.

## 2015-10-07 NOTE — Progress Notes (Signed)
[place] [phone]   [phone]  OFFICE PROGRESS NOTE  [name], MD [place] [address]  DIAGNOSIS: Stage IV (T1b, N2, M1 A) non-small cell lung cancer, adenocarcinoma with negative EGFR mutation, negative ALK gene translocation, presenting with right upper lobe lung mass in addition to mediastinal lymphadenopathy and malignant pleural effusion as well as innumerable bilateral pulmonary nodules.  PRIOR THERAPY: None  CURRENT THERAPY: Systemic chemotherapy with carboplatin for AUC of 5 and Alimta 500 MG/M2 every 3 weeks. First dose on 09/16/2015. Status post one cycle.  INTERVAL HISTORY: Deborah [name] 69 y.o. female returns to the clinic today for follow-up visit accompanied by her husband. The patient related the first cycle of her systemic chemotherapy with carboplatin and Alimta fairly well. She was seen recently at [facility] by Dr. Durenda Hurt for second opinion and he recommended for her to continue with the same treatment. She denied having any significant fever or chills, no weight loss or night sweats. She has minimal drainage of the right pleural effusion at this point and she is expected to have the Pleurx catheter removed soon by Dr. Roxan Hockey. She is here today to start cycle #2 of her systemic chemotherapy.  MEDICAL HISTORY: Past Medical History  Diagnosis Date  . Allergy     Mold  . Hyperlipidemia 08/04/2011  . Hypothyroidism 08/04/2011  . Atrophic vaginitis   . 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 Basal cell carcinoma of skin 2010  . Osteopenia 10/2012    T score -1.7 FRAX 7%/0.3%  . Vertigo   . Non-small cell lung cancer [facility]) September 2016    Adenocarcinoma  . Shortness of breath dyspnea   . Meniere's disease     ALLERGIES:  has No Known Allergies.  MEDICATIONS:  Current Outpatient Prescriptions  Medication Sig Dispense  Refill  . Cholecalciferol (VITAMIN D-3) 1000 UNITS CAPS Take 1 capsule by mouth daily.     Marland Kitchen dexamethasone (DECADRON) 4 MG tablet 4 mg po bid the day before, day of and day after chemo 40 tablet 1  . folic acid (FOLVITE) 1 MG tablet Take 1 tablet (1 mg total) by mouth daily. 30 tablet 3  . levothyroxine (SYNTHROID, LEVOTHROID) 75 MCG tablet TAKE 1 TABLET BY MOUTH DAILY. 90 tablet 3  . LORazepam (ATIVAN) 0.5 MG tablet Take one every eight hours as needed for anxiety.  Prefers one at bedtime. 30 tablet 0  . ondansetron (ZOFRAN) 8 MG tablet Take 1 tablet (8 mg total) by mouth every 8 (eight) hours as needed for nausea or vomiting. 20 tablet 0  . triamterene-hydrochlorothiazide (DYAZIDE) 37.5-25 MG per capsule Take 1 capsule by mouth daily as needed.     Marland Kitchen albuterol (PROVENTIL HFA;VENTOLIN HFA) 108 (90 BASE) MCG/ACT inhaler Inhale 2 puffs into the lungs every 6 (six) hours as needed for wheezing or shortness of breath. (Patient not taking: Reported on 09/23/2015) 1 Inhaler 1  . HYDROcodone-homatropine (HYCODAN) 5-1.5 MG/5ML syrup Take 5 mLs by mouth every 6 (six) hours as needed for cough. (Patient not taking: Reported on 10/07/2015) 120 mL 0  . prochlorperazine (COMPAZINE) 10 MG tablet Take 1 tablet (10 mg total) by mouth every 6 (six) hours as needed for nausea or vomiting. (Patient not taking: Reported on 10/07/2015) 30 tablet 0   No current facility-administered medications for this visit.    SURGICAL HISTORY:  Past Surgical History  Procedure Laterality Date  . Tonsillectomy  1952  . Vaginal hysterectomy  1980  . Breast biopsy  1999    RADIATION TREATMENTS  . Breast lumpectomy Right 1998    right  . Chest tube insertion Right 09/15/2015    Procedure: INSERTION RIGHT PLEURAL DRAINAGE CATHETER;  Surgeon: Melrose Nakayama, MD;  Location: [facility];  Service: Thoracic;  Laterality: Right;    REVIEW OF SYSTEMS:  A comprehensive review of systems was negative except for: Constitutional: positive  for fatigue Gastrointestinal: positive for nausea   PHYSICAL EXAMINATION: General appearance: alert, cooperative, fatigued and no distress Head: Normocephalic, without obvious abnormality, atraumatic Neck: no adenopathy, no JVD, supple, symmetrical, trachea midline and thyroid not enlarged, symmetric, no tenderness/mass/nodules Lymph nodes: Cervical, supraclavicular, and axillary nodes normal. Resp: diminished breath sounds RLL and dullness to percussion RLL Back: symmetric, no curvature. ROM normal. No CVA tenderness. Cardio: regular rate and rhythm, S1, S2 normal, no murmur, click, rub or gallop GI: soft, non-tender; bowel sounds normal; no masses,  no organomegaly Extremities: extremities normal, atraumatic, no cyanosis or edema Neurologic: Alert and oriented X 3, normal strength and tone. Normal symmetric reflexes. Normal coordination and gait  ECOG PERFORMANCE STATUS: 1 - Symptomatic but completely ambulatory  Blood pressure 135/88, pulse 98, temperature 98 F (36.7 C), temperature source Oral, resp. rate 18, height $RemoveBe'5\' 9"'BrLVesDdx$  (1.753 m), weight 141 lb 3.2 oz (64.048 kg), last menstrual period 10/11/1979, SpO2 98 %.  LABORATORY DATA: Lab Results  Component Value Date   WBC 15.9* 10/07/2015   HGB 12.9 10/07/2015   HCT 38.1 10/07/2015   MCV 85.2 10/07/2015   PLT 486* 10/07/2015      Chemistry      Component Value Date/Time   NA 136 10/07/2015 1144   NA 139 09/15/2015 1210   K 4.0 10/07/2015 1144   K 4.5 09/15/2015 1210   CL 107 09/15/2015 1210   CO2 24 10/07/2015 1144   CO2 23 09/15/2015 1210   BUN 16.9 10/07/2015 1144   BUN 13 09/15/2015 1210   CREATININE 0.8 10/07/2015 1144   CREATININE 0.86 09/15/2015 1210      Component Value Date/Time   CALCIUM 10.1 10/07/2015 1144   CALCIUM 9.5 09/15/2015 1210   ALKPHOS 121 10/07/2015 1144   ALKPHOS 95 09/15/2015 1210   AST 39* 10/07/2015 1144   AST 41 09/15/2015 1210   ALT 33 10/07/2015 1144   ALT 12* 09/15/2015 1210   BILITOT  0.36 10/07/2015 1144   BILITOT 0.7 09/15/2015 1210       RADIOGRAPHIC STUDIES: Dg Chest 2 View  09/15/2015  CLINICAL DATA:  Non-small cell lung carcinoma. Pleural drainage catheter placement. EXAM: CHEST  2 VIEW COMPARISON:  PET CT scan 09/01/2015. PA and lateral chest 08/25/2015. FINDINGS: No pleural drainage catheter is identified. The patient has a moderate right pleural effusion with associated basilar airspace disease. The effusion appears somewhat increased compared to the prior plain films. Right hilar mass is again identified. There are innumerable small bilateral pulmonary nodules. No focal bony abnormality is identified. IMPRESSION: No pleural drainage catheter is identified. Small to moderate right pleural effusion appears increased compared to the prior plain films. Right hilar mass and innumerable bilateral pulmonary nodules. Electronically Signed   By: Inge Rise M.D.   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 Assess for brain  metastases. Staging. EXAM: MRI HEAD WITHOUT AND WITH CONTRAST TECHNIQUE: Multiplanar, multiecho pulse sequences of the brain and surrounding structures were obtained without and with intravenous contrast. CONTRAST:  32mL MULTIHANCE GADOBENATE DIMEGLUMINE 529 MG/ML IV SOLN COMPARISON:  07/15/2013 FINDINGS: Diffusion imaging does not show any acute infarction or other cause of restricted diffusion. In the right cerebellum, there is an incidental venous angioma without evidence of previous hemorrhage. There is no evidence of metastatic disease affecting the brain, skull or skullbase. There is no evidence of old or acute infarction, hydrocephalus or extra-axial collection. No other abnormal enhancement. No pituitary mass. No inflammatory sinus disease. IMPRESSION: Normal study.  No evidence of metastatic disease. Electronically Signed   By: Nelson Chimes M.D.   On:  09/08/2015 14:36   Dg C-arm 1-60 Min-no Report  09/15/2015  CLINICAL DATA: Surgery C-ARM 1-60 MINUTES Fluoroscopy was utilized by the requesting physician.  No radiographic interpretation.    ASSESSMENT AND PLAN: This is a very pleasant 69 years old white female recently diagnosed with stage IV non-small cell lung cancer, adenocarcinoma. Her molecular studies showed no actionable mutations. The patient was started on systemic chemotherapy with carboplatin and Alimta status post 1 cycle of treatment and tolerated the first week of her treatment fairly well except for few episodes of nausea. We added few antiemetics to her regimen including Zofran 8 mg by mouth every 8 hours in addition to Ativan when necessary. Her nausea is much better controlled. The patient will proceed with cycle #2 today as a scheduled. She would come back for follow-up visit in 3 weeks for reevaluation before starting cycle #3. The patient was advised to call immediately if she has any concerning symptoms in the interval. The patient voices understanding of current disease status and treatment options and is in agreement with the current care plan.  All questions were answered. The patient knows to call the clinic with any problems, questions or concerns. We can certainly see the patient much sooner if necessary.  Disclaimer: This note was dictated with voice recognition software. Similar sounding words can inadvertently be transcribed and may not be corrected upon review.

## 2015-10-08 DIAGNOSIS — I10 Essential (primary) hypertension: Secondary | ICD-10-CM | POA: Diagnosis not present

## 2015-10-08 DIAGNOSIS — J90 Pleural effusion, not elsewhere classified: Secondary | ICD-10-CM | POA: Diagnosis not present

## 2015-10-08 DIAGNOSIS — F419 Anxiety disorder, unspecified: Secondary | ICD-10-CM | POA: Diagnosis not present

## 2015-10-13 ENCOUNTER — Other Ambulatory Visit: Payer: Self-pay | Admitting: *Deleted

## 2015-10-13 DIAGNOSIS — C3490 Malignant neoplasm of unspecified part of unspecified bronchus or lung: Secondary | ICD-10-CM

## 2015-10-13 DIAGNOSIS — I10 Essential (primary) hypertension: Secondary | ICD-10-CM | POA: Diagnosis not present

## 2015-10-13 DIAGNOSIS — F419 Anxiety disorder, unspecified: Secondary | ICD-10-CM | POA: Diagnosis not present

## 2015-10-13 DIAGNOSIS — J90 Pleural effusion, not elsewhere classified: Secondary | ICD-10-CM | POA: Diagnosis not present

## 2015-10-14 ENCOUNTER — Other Ambulatory Visit: Payer: Self-pay

## 2015-10-14 ENCOUNTER — Other Ambulatory Visit (HOSPITAL_BASED_OUTPATIENT_CLINIC_OR_DEPARTMENT_OTHER): Payer: Medicare Other

## 2015-10-14 DIAGNOSIS — C3491 Malignant neoplasm of unspecified part of right bronchus or lung: Secondary | ICD-10-CM | POA: Diagnosis not present

## 2015-10-14 DIAGNOSIS — C3490 Malignant neoplasm of unspecified part of unspecified bronchus or lung: Secondary | ICD-10-CM

## 2015-10-14 DIAGNOSIS — J90 Pleural effusion, not elsewhere classified: Secondary | ICD-10-CM

## 2015-10-14 LAB — CBC WITH DIFFERENTIAL/PLATELET
BASO%: 0.6 % (ref 0.0–2.0)
Basophils Absolute: 0 10*3/uL (ref 0.0–0.1)
EOS%: 1.2 % (ref 0.0–7.0)
Eosinophils Absolute: 0.1 10*3/uL (ref 0.0–0.5)
HCT: 35.1 % (ref 34.8–46.6)
HGB: 11.8 g/dL (ref 11.6–15.9)
LYMPH%: 40.7 % (ref 14.0–49.7)
MCH: 29 pg (ref 25.1–34.0)
MCHC: 33.6 g/dL (ref 31.5–36.0)
MCV: 86.2 fL (ref 79.5–101.0)
MONO#: 0.4 10*3/uL (ref 0.1–0.9)
MONO%: 7.9 % (ref 0.0–14.0)
NEUT#: 2.6 10*3/uL (ref 1.5–6.5)
NEUT%: 49.6 % (ref 38.4–76.8)
Platelets: 132 10*3/uL — ABNORMAL LOW (ref 145–400)
RBC: 4.07 10*6/uL (ref 3.70–5.45)
RDW: 14.4 % (ref 11.2–14.5)
WBC: 5.2 10*3/uL (ref 3.9–10.3)
lymph#: 2.1 10*3/uL (ref 0.9–3.3)

## 2015-10-14 LAB — COMPREHENSIVE METABOLIC PANEL (CC13)
ALT: 37 U/L (ref 0–55)
AST: 53 U/L — AB (ref 5–34)
Albumin: 2.8 g/dL — ABNORMAL LOW (ref 3.5–5.0)
Alkaline Phosphatase: 106 U/L (ref 40–150)
Anion Gap: 7 mEq/L (ref 3–11)
BUN: 15.5 mg/dL (ref 7.0–26.0)
CHLORIDE: 102 meq/L (ref 98–109)
CO2: 28 meq/L (ref 22–29)
CREATININE: 0.7 mg/dL (ref 0.6–1.1)
Calcium: 9.2 mg/dL (ref 8.4–10.4)
EGFR: 85 mL/min/{1.73_m2} — ABNORMAL LOW (ref >90)
Glucose: 87 mg/dl (ref 70–140)
POTASSIUM: 3.6 meq/L (ref 3.5–5.1)
Sodium: 136 mEq/L (ref 136–145)
Total Bilirubin: 0.74 mg/dL (ref 0.20–1.20)
Total Protein: 5.7 g/dL — ABNORMAL LOW (ref 6.4–8.3)

## 2015-10-20 ENCOUNTER — Other Ambulatory Visit: Payer: Self-pay | Admitting: Medical Oncology

## 2015-10-20 DIAGNOSIS — I10 Essential (primary) hypertension: Secondary | ICD-10-CM | POA: Diagnosis not present

## 2015-10-20 DIAGNOSIS — J90 Pleural effusion, not elsewhere classified: Secondary | ICD-10-CM | POA: Diagnosis not present

## 2015-10-20 DIAGNOSIS — F419 Anxiety disorder, unspecified: Secondary | ICD-10-CM | POA: Diagnosis not present

## 2015-10-20 DIAGNOSIS — C3490 Malignant neoplasm of unspecified part of unspecified bronchus or lung: Secondary | ICD-10-CM

## 2015-10-21 ENCOUNTER — Other Ambulatory Visit (HOSPITAL_BASED_OUTPATIENT_CLINIC_OR_DEPARTMENT_OTHER): Payer: Medicare Other

## 2015-10-21 DIAGNOSIS — C3491 Malignant neoplasm of unspecified part of right bronchus or lung: Secondary | ICD-10-CM | POA: Diagnosis not present

## 2015-10-21 DIAGNOSIS — C3490 Malignant neoplasm of unspecified part of unspecified bronchus or lung: Secondary | ICD-10-CM

## 2015-10-21 LAB — CBC WITH DIFFERENTIAL/PLATELET
BASO%: 0.5 % (ref 0.0–2.0)
BASOS ABS: 0 10*3/uL (ref 0.0–0.1)
EOS%: 1.8 % (ref 0.0–7.0)
Eosinophils Absolute: 0.1 10*3/uL (ref 0.0–0.5)
HCT: 31 % — ABNORMAL LOW (ref 34.8–46.6)
HGB: 10.4 g/dL — ABNORMAL LOW (ref 11.6–15.9)
LYMPH%: 34.3 % (ref 14.0–49.7)
MCH: 28.8 pg (ref 25.1–34.0)
MCHC: 33.7 g/dL (ref 31.5–36.0)
MCV: 85.5 fL (ref 79.5–101.0)
MONO#: 0.6 10*3/uL (ref 0.1–0.9)
MONO%: 19.3 % — ABNORMAL HIGH (ref 0.0–14.0)
NEUT#: 1.4 10*3/uL — ABNORMAL LOW (ref 1.5–6.5)
NEUT%: 44.1 % (ref 38.4–76.8)
PLATELETS: 192 10*3/uL (ref 145–400)
RBC: 3.62 10*6/uL — AB (ref 3.70–5.45)
RDW: 15.5 % — ABNORMAL HIGH (ref 11.2–14.5)
WBC: 3.2 10*3/uL — ABNORMAL LOW (ref 3.9–10.3)
lymph#: 1.1 10*3/uL (ref 0.9–3.3)

## 2015-10-21 LAB — COMPREHENSIVE METABOLIC PANEL (CC13)
ALT: 75 U/L — ABNORMAL HIGH (ref 0–55)
ANION GAP: 6 meq/L (ref 3–11)
AST: 71 U/L — ABNORMAL HIGH (ref 5–34)
Albumin: 2.9 g/dL — ABNORMAL LOW (ref 3.5–5.0)
Alkaline Phosphatase: 128 U/L (ref 40–150)
BUN: 8.3 mg/dL (ref 7.0–26.0)
CHLORIDE: 104 meq/L (ref 98–109)
CO2: 28 meq/L (ref 22–29)
Calcium: 9.5 mg/dL (ref 8.4–10.4)
Creatinine: 0.8 mg/dL (ref 0.6–1.1)
EGFR: 80 mL/min/{1.73_m2} — AB (ref >90)
Glucose: 127 mg/dl (ref 70–140)
POTASSIUM: 3.6 meq/L (ref 3.5–5.1)
Sodium: 138 mEq/L (ref 136–145)
Total Bilirubin: 0.58 mg/dL (ref 0.20–1.20)
Total Protein: 5.9 g/dL — ABNORMAL LOW (ref 6.4–8.3)

## 2015-10-25 ENCOUNTER — Encounter: Payer: Self-pay | Admitting: *Deleted

## 2015-10-26 ENCOUNTER — Ambulatory Visit (INDEPENDENT_AMBULATORY_CARE_PROVIDER_SITE_OTHER): Payer: Medicare Other | Admitting: Thoracic Surgery (Cardiothoracic Vascular Surgery)

## 2015-10-26 ENCOUNTER — Ambulatory Visit
Admission: RE | Admit: 2015-10-26 | Discharge: 2015-10-26 | Disposition: A | Discharge status: Home / Self Care | Payer: Medicare Other | Source: Ambulatory Visit | Attending: Thoracic Surgery (Cardiothoracic Vascular Surgery) | Admitting: Thoracic Surgery (Cardiothoracic Vascular Surgery)

## 2015-10-26 ENCOUNTER — Encounter: Payer: Self-pay | Admitting: Thoracic Surgery (Cardiothoracic Vascular Surgery)

## 2015-10-26 VITALS — BP 130/80 | HR 120 | Resp 20 | Ht 69.0 in | Wt 140.0 lb

## 2015-10-26 DIAGNOSIS — J948 Other specified pleural conditions: Secondary | ICD-10-CM

## 2015-10-26 DIAGNOSIS — J90 Pleural effusion, not elsewhere classified: Secondary | ICD-10-CM

## 2015-10-26 DIAGNOSIS — C3490 Malignant neoplasm of unspecified part of unspecified bronchus or lung: Secondary | ICD-10-CM | POA: Diagnosis not present

## 2015-10-26 NOTE — Progress Notes (Signed)
[address]       [place] [address]             [phone]       HPI:  Deborah [name] returns today for follow-up related to her right pleural effusion and pleural catheter.  She is a 69 year old woman who was recently diagnosed with stage IV lung cancer. She had a recurring malignant right pleural effusion. I placed a Pleurx catheter on 09/15/2015. 1.4 L of fluid was drained and the catheter was placed. She has started chemotherapy since that time.   Initially she was draining a moderate amount in the catheter. But she says that over the past 3 weeks there has been no drainage.  Past Medical History  Diagnosis Date  . Allergy     Mold  . Hyperlipidemia 08/04/2011  . Hypothyroidism 08/04/2011  . Atrophic vaginitis   . 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 Basal cell carcinoma of skin 2010  . Osteopenia 10/2012    T score -1.7 FRAX 7%/0.3%  . Vertigo   . Non-small cell lung cancer [facility]) September 2016    Adenocarcinoma  . Shortness of breath dyspnea   . Meniere's disease       Current Outpatient Prescriptions  Medication Sig Dispense Refill  . albuterol (PROVENTIL HFA;VENTOLIN HFA) 108 (90 BASE) MCG/ACT inhaler Inhale 2 puffs into the lungs every 6 (six) hours as needed for wheezing or shortness of breath. 1 Inhaler 1  . Cholecalciferol (VITAMIN D-3) 1000 UNITS CAPS Take 1 capsule by mouth daily.     Marland Kitchen dexamethasone (DECADRON) 4 MG tablet 4 mg po bid the day before, day of and day after chemo 40 tablet 1  . folic acid (FOLVITE) 1 MG tablet Take 1 tablet (1 mg total) by mouth daily. 30 tablet 3  . HYDROcodone-homatropine (HYCODAN) 5-1.5 MG/5ML syrup Take 5 mLs by mouth every 6 (six) hours as needed for cough. 120 mL 0  . levothyroxine (SYNTHROID, LEVOTHROID) 75 MCG tablet TAKE 1 TABLET BY MOUTH DAILY. 90 tablet 3  . LORazepam (ATIVAN) 0.5 MG tablet Take one every eight hours as needed for anxiety.   Prefers one at bedtime. 30 tablet 0  . ondansetron (ZOFRAN) 8 MG tablet Take 1 tablet (8 mg total) by mouth every 8 (eight) hours as needed for nausea or vomiting. 20 tablet 0  . prochlorperazine (COMPAZINE) 10 MG tablet Take 1 tablet (10 mg total) by mouth every 6 (six) hours as needed for nausea or vomiting. 30 tablet 0  . triamterene-hydrochlorothiazide (DYAZIDE) 37.5-25 MG per capsule Take 1 capsule by mouth daily as needed.      No current facility-administered medications for this visit.    Physical Exam  Diagnostic Tests: CHEST - 2 VIEW  COMPARISON: Two-view chest x-ray and 09/15/2015.  FINDINGS: The heart size is normal. There is significant reduction in the right pleural effusion following placement of a pleural catheter. There is some persistent fluid within the minor fissure is well is the known right middle lobe and lower lobe neoplasm. The left lung is clear. The visualized soft tissues and bony thorax are unremarkable.  IMPRESSION: 1. Significant reduction in right pleural effusion following placement of pleural catheter. 2. Persistent opacities compatible with neoplasm within the right middle and lower lobes.   Electronically Signed  By: San Morelle M.D.  On: 10/26/2015 14:33  I personally reviewed the CXR and  concur with the findings noted above.  Impression: 69 year old woman with stage IV lung cancer and a malignant right pleural effusion. I placed a Pleurx catheter about 6 weeks ago. Over the past 3 weeks there has been no drainage from the catheter. Her chest x-ray shows marked improvement of the pleural effusion. At this point the catheter is not needed and is a potential source of infection. I recommended that we remove the catheter in the office today.   Plan:  Remove pleural catheter Followup in one month with a repeat CXR  I spent 15 minutes with Deborah [name] during this visit   Procedure Area around pleural catheter  exit sterilely prepped 3 ml of 1% lidocaine used for local anesthetic Pleural catheter removed intact without difficulty Patient tolerated well  Melrose Nakayama, MD [facility] [phone]

## 2015-10-27 DIAGNOSIS — F419 Anxiety disorder, unspecified: Secondary | ICD-10-CM | POA: Diagnosis not present

## 2015-10-27 DIAGNOSIS — J90 Pleural effusion, not elsewhere classified: Secondary | ICD-10-CM | POA: Diagnosis not present

## 2015-10-27 DIAGNOSIS — I10 Essential (primary) hypertension: Secondary | ICD-10-CM | POA: Diagnosis not present

## 2015-10-28 ENCOUNTER — Encounter: Payer: Self-pay | Admitting: Internal Medicine

## 2015-10-28 ENCOUNTER — Ambulatory Visit (HOSPITAL_BASED_OUTPATIENT_CLINIC_OR_DEPARTMENT_OTHER): Payer: Medicare Other

## 2015-10-28 ENCOUNTER — Encounter: Payer: Self-pay | Admitting: *Deleted

## 2015-10-28 ENCOUNTER — Other Ambulatory Visit (HOSPITAL_BASED_OUTPATIENT_CLINIC_OR_DEPARTMENT_OTHER): Payer: Medicare Other

## 2015-10-28 ENCOUNTER — Ambulatory Visit (HOSPITAL_BASED_OUTPATIENT_CLINIC_OR_DEPARTMENT_OTHER): Payer: Medicare Other | Admitting: Internal Medicine

## 2015-10-28 DIAGNOSIS — C3491 Malignant neoplasm of unspecified part of right bronchus or lung: Secondary | ICD-10-CM

## 2015-10-28 DIAGNOSIS — Z5111 Encounter for antineoplastic chemotherapy: Secondary | ICD-10-CM

## 2015-10-28 DIAGNOSIS — J910 Malignant pleural effusion: Secondary | ICD-10-CM | POA: Diagnosis not present

## 2015-10-28 HISTORY — DX: Encounter for antineoplastic chemotherapy: Z51.11

## 2015-10-28 LAB — CBC WITH DIFFERENTIAL/PLATELET
BASO%: 0.3 % (ref 0.0–2.0)
BASOS ABS: 0 10*3/uL (ref 0.0–0.1)
EOS ABS: 0 10*3/uL (ref 0.0–0.5)
EOS%: 0 % (ref 0.0–7.0)
HCT: 33.6 % — ABNORMAL LOW (ref 34.8–46.6)
HEMOGLOBIN: 11 g/dL — AB (ref 11.6–15.9)
LYMPH%: 24.2 % (ref 14.0–49.7)
MCH: 29.3 pg (ref 25.1–34.0)
MCHC: 32.7 g/dL (ref 31.5–36.0)
MCV: 89.6 fL (ref 79.5–101.0)
MONO#: 1.1 10*3/uL — AB (ref 0.1–0.9)
MONO%: 11.5 % (ref 0.0–14.0)
NEUT%: 64 % (ref 38.4–76.8)
NEUTROS ABS: 6.3 10*3/uL (ref 1.5–6.5)
PLATELETS: 446 10*3/uL — AB (ref 145–400)
RBC: 3.75 10*6/uL (ref 3.70–5.45)
RDW: 16.4 % — AB (ref 11.2–14.5)
WBC: 9.9 10*3/uL (ref 3.9–10.3)
lymph#: 2.4 10*3/uL (ref 0.9–3.3)

## 2015-10-28 LAB — COMPREHENSIVE METABOLIC PANEL (CC13)
ALBUMIN: 2.9 g/dL — AB (ref 3.5–5.0)
ALK PHOS: 119 U/L (ref 40–150)
ALT: 19 U/L (ref 0–55)
ANION GAP: 12 meq/L — AB (ref 3–11)
AST: 28 U/L (ref 5–34)
BILIRUBIN TOTAL: 0.35 mg/dL (ref 0.20–1.20)
BUN: 12.8 mg/dL (ref 7.0–26.0)
CALCIUM: 9.9 mg/dL (ref 8.4–10.4)
CO2: 19 mEq/L — ABNORMAL LOW (ref 22–29)
Chloride: 108 mEq/L (ref 98–109)
Creatinine: 0.8 mg/dL (ref 0.6–1.1)
EGFR: 77 mL/min/{1.73_m2} — AB (ref >90)
Glucose: 140 mg/dl (ref 70–140)
Potassium: 4.1 mEq/L (ref 3.5–5.1)
Sodium: 140 mEq/L (ref 136–145)
TOTAL PROTEIN: 6.1 g/dL — AB (ref 6.4–8.3)

## 2015-10-28 LAB — TECHNOLOGIST REVIEW

## 2015-10-28 MED ORDER — DEXAMETHASONE SODIUM PHOSPHATE 100 MG/10ML IJ SOLN
Freq: Once | INTRAMUSCULAR | Status: AC
  Administered 2015-10-28: 13:00:00 via INTRAVENOUS
  Filled 2015-10-28: qty 8

## 2015-10-28 MED ORDER — SODIUM CHLORIDE 0.9 % IV SOLN
500.0000 mg/m2 | Freq: Once | INTRAVENOUS | Status: AC
  Administered 2015-10-28: 900 mg via INTRAVENOUS
  Filled 2015-10-28: qty 36

## 2015-10-28 MED ORDER — SODIUM CHLORIDE 0.9 % IV SOLN
Freq: Once | INTRAVENOUS | Status: AC
  Administered 2015-10-28: 13:00:00 via INTRAVENOUS

## 2015-10-28 MED ORDER — CYANOCOBALAMIN 1000 MCG/ML IJ SOLN
1000.0000 ug | Freq: Once | INTRAMUSCULAR | Status: AC
  Administered 2015-10-28: 1000 ug via INTRAMUSCULAR

## 2015-10-28 MED ORDER — CYANOCOBALAMIN 1000 MCG/ML IJ SOLN
INTRAMUSCULAR | Status: AC
  Filled 2015-10-28: qty 1

## 2015-10-28 MED ORDER — SODIUM CHLORIDE 0.9 % IV SOLN
408.5000 mg | Freq: Once | INTRAVENOUS | Status: AC
  Administered 2015-10-28: 410 mg via INTRAVENOUS
  Filled 2015-10-28: qty 41

## 2015-10-28 NOTE — Progress Notes (Signed)
Oncology Nurse Navigator Documentation  Oncology Nurse Navigator Flowsheets 10/28/2015  Navigator Encounter Type Clinic/MDC/spoke with patient and husband today at clinic.  Patient states she is feeling good.  She had a side effect of chemo, rash and she explained.  Patient was instructed to call if her rash got worse  Patient Visit Type Follow-up  Treatment Phase Treatment  Barriers/Navigation Needs No barriers at this time  Interventions -  Coordination of Care -  Time Spent with Patient 15

## 2015-10-28 NOTE — Progress Notes (Signed)
[place] [phone]) [phone]   [phone]  OFFICE PROGRESS NOTE  [name], MD [place] [address]  DIAGNOSIS: Stage IV (T1b, N2, M1 A) non-small cell lung cancer, adenocarcinoma with negative EGFR mutation, negative ALK gene translocation, presenting with right upper lobe lung mass in addition to mediastinal lymphadenopathy and malignant pleural effusion as well as innumerable bilateral pulmonary nodules.  PRIOR THERAPY: None  CURRENT THERAPY: Systemic chemotherapy with carboplatin for AUC of 5 and Alimta 500 MG/M2 every 3 weeks. First dose on 09/16/2015. Status post 2 cycles.  INTERVAL HISTORY: Deborah [name] 69 y.o. female returns to the clinic today for follow-up visit accompanied by her husband. The patient is tolerating her systemic chemotherapy with carboplatin and Alimta fairly well. She denied having any significant fever or chills, no weight loss or night sweats. She denied having any significant nausea or vomiting. The patient denied having any significant chest pain, shortness breath, cough or hemoptysis. The Pleurx catheter was removed by Dr. Roxan Hockey. She is here today to start cycle #3 of her chemotherapy.  MEDICAL HISTORY: Past Medical History  Diagnosis Date  . Allergy     Mold  . Hyperlipidemia 08/04/2011  . Hypothyroidism 08/04/2011  . Atrophic vaginitis   . 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 Basal cell carcinoma of skin 2010  . Osteopenia 10/2012    T score -1.7 FRAX 7%/0.3%  . Vertigo   . Non-small cell lung cancer [facility]) September 2016    Adenocarcinoma  . Shortness of breath dyspnea   . Meniere's disease     ALLERGIES:  has No Known Allergies.  MEDICATIONS:  Current Outpatient Prescriptions  Medication Sig Dispense Refill  . albuterol (PROVENTIL HFA;VENTOLIN HFA) 108 (90 BASE) MCG/ACT inhaler Inhale 2 puffs into the lungs every 6  (six) hours as needed for wheezing or shortness of breath. 1 Inhaler 1  . Cholecalciferol (VITAMIN D-3) 1000 UNITS CAPS Take 1 capsule by mouth daily.     Marland Kitchen dexamethasone (DECADRON) 4 MG tablet 4 mg po bid the day before, day of and day after chemo 40 tablet 1  . folic acid (FOLVITE) 1 MG tablet Take 1 tablet (1 mg total) by mouth daily. 30 tablet 3  . HYDROcodone-homatropine (HYCODAN) 5-1.5 MG/5ML syrup Take 5 mLs by mouth every 6 (six) hours as needed for cough. 120 mL 0  . levothyroxine (SYNTHROID, LEVOTHROID) 75 MCG tablet TAKE 1 TABLET BY MOUTH DAILY. 90 tablet 3  . LORazepam (ATIVAN) 0.5 MG tablet Take one every eight hours as needed for anxiety.  Prefers one at bedtime. 30 tablet 0  . ondansetron (ZOFRAN) 8 MG tablet Take 1 tablet (8 mg total) by mouth every 8 (eight) hours as needed for nausea or vomiting. 20 tablet 0  . prochlorperazine (COMPAZINE) 10 MG tablet Take 1 tablet (10 mg total) by mouth every 6 (six) hours as needed for nausea or vomiting. 30 tablet 0  . triamterene-hydrochlorothiazide (DYAZIDE) 37.5-25 MG per capsule Take 1 capsule by mouth daily as needed.      No current facility-administered medications for this visit.    SURGICAL HISTORY:  Past Surgical History  Procedure Laterality Date  . Tonsillectomy  1952  . Vaginal hysterectomy  1980  . Breast biopsy  1999    RADIATION TREATMENTS  . Breast lumpectomy Right 1998    right  . Chest  tube insertion Right 09/15/2015    Procedure: INSERTION RIGHT PLEURAL DRAINAGE CATHETER;  Surgeon: Melrose Nakayama, MD;  Location: [facility];  Service: Thoracic;  Laterality: Right;    REVIEW OF SYSTEMS:  A comprehensive review of systems was negative.   PHYSICAL EXAMINATION: General appearance: alert, cooperative, fatigued and no distress Head: Normocephalic, without obvious abnormality, atraumatic Neck: no adenopathy, no JVD, supple, symmetrical, trachea midline and thyroid not enlarged, symmetric, no  tenderness/mass/nodules Lymph nodes: Cervical, supraclavicular, and axillary nodes normal. Resp: diminished breath sounds RLL and dullness to percussion RLL Back: symmetric, no curvature. ROM normal. No CVA tenderness. Cardio: regular rate and rhythm, S1, S2 normal, no murmur, click, rub or gallop GI: soft, non-tender; bowel sounds normal; no masses,  no organomegaly Extremities: extremities normal, atraumatic, no cyanosis or edema Neurologic: Alert and oriented X 3, normal strength and tone. Normal symmetric reflexes. Normal coordination and gait  ECOG PERFORMANCE STATUS: 1 - Symptomatic but completely ambulatory  Last menstrual period 10/11/1979.  LABORATORY DATA: Lab Results  Component Value Date   WBC 9.9 10/28/2015   HGB 11.0* 10/28/2015   HCT 33.6* 10/28/2015   MCV 89.6 10/28/2015   PLT 446* 10/28/2015      Chemistry      Component Value Date/Time   NA 138 10/21/2015 1003   NA 139 09/15/2015 1210   K 3.6 10/21/2015 1003   K 4.5 09/15/2015 1210   CL 107 09/15/2015 1210   CO2 28 10/21/2015 1003   CO2 23 09/15/2015 1210   BUN 8.3 10/21/2015 1003   BUN 13 09/15/2015 1210   CREATININE 0.8 10/21/2015 1003   CREATININE 0.86 09/15/2015 1210      Component Value Date/Time   CALCIUM 9.5 10/21/2015 1003   CALCIUM 9.5 09/15/2015 1210   ALKPHOS 128 10/21/2015 1003   ALKPHOS 95 09/15/2015 1210   AST 71* 10/21/2015 1003   AST 41 09/15/2015 1210   ALT 75* 10/21/2015 1003   ALT 12* 09/15/2015 1210   BILITOT 0.58 10/21/2015 1003   BILITOT 0.7 09/15/2015 1210       RADIOGRAPHIC STUDIES: Dg Chest 2 View  10/26/2015  CLINICAL DATA:  Non-small cell lung cancer.  Right-sided effusion. EXAM: CHEST - 2 VIEW COMPARISON:  Two-view chest x-ray and 09/15/2015. FINDINGS: The heart size is normal. There is significant reduction in the right pleural effusion following placement of a pleural catheter. There is some persistent fluid within the minor fissure is well is the known right  middle lobe and lower lobe neoplasm. The left lung is clear. The visualized soft tissues and bony thorax are unremarkable. IMPRESSION: 1. Significant reduction in right pleural effusion following placement of pleural catheter. 2. Persistent opacities compatible with neoplasm within the right middle and lower lobes. Electronically Signed   By: San Morelle M.D.   On: 10/26/2015 14:33    ASSESSMENT AND PLAN: This is a very pleasant 69 years old white female recently diagnosed with stage IV non-small cell lung cancer, adenocarcinoma. Her molecular studies showed no actionable mutations. The patient was started on systemic chemotherapy with carboplatin and Alimta status post 2 cycles of treatment and tolerated the 2 cycles of her treatment fairly well.  The patient will proceed with cycle #3 today as a scheduled. She would come back for follow-up visit in 3 weeks for reevaluation before starting cycle #4 after repeating CT scan of the chest, abdomen and pelvis for restaging of her disease. The patient was advised to call immediately if she has any  concerning symptoms in the interval. The patient voices understanding of current disease status and treatment options and is in agreement with the current care plan.  All questions were answered. The patient knows to call the clinic with any problems, questions or concerns. We can certainly see the patient much sooner if necessary.  Disclaimer: This note was dictated with voice recognition software. Similar sounding words can inadvertently be transcribed and may not be corrected upon review.

## 2015-10-28 NOTE — Patient Instructions (Signed)
[facility] Discharge Instructions for Patients Receiving Chemotherapy  Today you received the following chemotherapy agents: Alimta, Carboplatin, Vit. B12 injection  To help prevent nausea and vomiting after your treatment, we encourage you to take your nausea medication as prescribed by your physician.   If you develop nausea and vomiting that is not controlled by your nausea medication, call the clinic.   BELOW ARE SYMPTOMS THAT SHOULD BE REPORTED IMMEDIATELY:  *FEVER GREATER THAN 100.5 F  *CHILLS WITH OR WITHOUT FEVER  NAUSEA AND VOMITING THAT IS NOT CONTROLLED WITH YOUR NAUSEA MEDICATION  *UNUSUAL SHORTNESS OF BREATH  *UNUSUAL BRUISING OR BLEEDING  TENDERNESS IN MOUTH AND THROAT WITH OR WITHOUT PRESENCE OF ULCERS  *URINARY PROBLEMS  *BOWEL PROBLEMS  UNUSUAL RASH Items with * indicate a potential emergency and should be followed up as soon as possible.  Feel free to call the clinic you have any questions or concerns. The clinic phone number is [phone].  Please show the [place] at check-in to the Emergency Department and triage nurse.

## 2015-10-30 ENCOUNTER — Encounter: Payer: Self-pay | Admitting: Internal Medicine

## 2015-11-02 ENCOUNTER — Telehealth: Payer: Self-pay

## 2015-11-02 DIAGNOSIS — C3491 Malignant neoplasm of unspecified part of right bronchus or lung: Secondary | ICD-10-CM

## 2015-11-02 MED ORDER — ONDANSETRON HCL 8 MG PO TABS: 8.0000 mg | ORAL_TABLET | Freq: Three times a day (TID) | ORAL | Status: DC | PRN

## 2015-11-02 MED ORDER — LORAZEPAM 0.5 MG PO TABS: ORAL_TABLET | ORAL | Status: DC

## 2015-11-02 NOTE — Telephone Encounter (Signed)
Rx for lorazepam phoned in, ondansetron escribed.

## 2015-11-02 NOTE — Telephone Encounter (Signed)
Husband calling requesting refills on ondansetron 8 mg, lorazepam, and triamterine-HCTZ. Instructed husband to call [facility] for the triamterene. Instructed husband that next time to call the pharmacy and they will contact us.

## 2015-11-02 NOTE — Addendum Note (Signed)
Addended by: Lucile Crater on: 11/02/2015 10:47 AM   Modules accepted: Orders, Medications

## 2015-11-04 ENCOUNTER — Other Ambulatory Visit (HOSPITAL_BASED_OUTPATIENT_CLINIC_OR_DEPARTMENT_OTHER): Payer: Medicare Other

## 2015-11-04 DIAGNOSIS — C3491 Malignant neoplasm of unspecified part of right bronchus or lung: Secondary | ICD-10-CM

## 2015-11-04 LAB — CBC WITH DIFFERENTIAL/PLATELET
BASO%: 1.1 % (ref 0.0–2.0)
Basophils Absolute: 0.1 10*3/uL (ref 0.0–0.1)
EOS ABS: 0 10*3/uL (ref 0.0–0.5)
EOS%: 0.4 % (ref 0.0–7.0)
HCT: 35.7 % (ref 34.8–46.6)
HEMOGLOBIN: 11.9 g/dL (ref 11.6–15.9)
LYMPH%: 43.3 % (ref 14.0–49.7)
MCH: 29 pg (ref 25.1–34.0)
MCHC: 33.3 g/dL (ref 31.5–36.0)
MCV: 87.1 fL (ref 79.5–101.0)
MONO#: 0.5 10*3/uL (ref 0.1–0.9)
MONO%: 10.5 % (ref 0.0–14.0)
NEUT%: 44.7 % (ref 38.4–76.8)
NEUTROS ABS: 2 10*3/uL (ref 1.5–6.5)
Platelets: 188 10*3/uL (ref 145–400)
RBC: 4.1 10*6/uL (ref 3.70–5.45)
RDW: 15.5 % — AB (ref 11.2–14.5)
WBC: 4.6 10*3/uL (ref 3.9–10.3)
lymph#: 2 10*3/uL (ref 0.9–3.3)

## 2015-11-04 LAB — COMPREHENSIVE METABOLIC PANEL (CC13)
ALK PHOS: 118 U/L (ref 40–150)
ALT: 22 U/L (ref 0–55)
AST: 41 U/L — AB (ref 5–34)
Albumin: 3.1 g/dL — ABNORMAL LOW (ref 3.5–5.0)
Anion Gap: 10 mEq/L (ref 3–11)
BUN: 15.3 mg/dL (ref 7.0–26.0)
CO2: 26 meq/L (ref 22–29)
Calcium: 9.5 mg/dL (ref 8.4–10.4)
Chloride: 100 mEq/L (ref 98–109)
Creatinine: 0.8 mg/dL (ref 0.6–1.1)
EGFR: 74 mL/min/{1.73_m2} — AB (ref >90)
GLUCOSE: 83 mg/dL (ref 70–140)
POTASSIUM: 4.2 meq/L (ref 3.5–5.1)
SODIUM: 135 meq/L — AB (ref 136–145)
TOTAL PROTEIN: 6.3 g/dL — AB (ref 6.4–8.3)
Total Bilirubin: 0.97 mg/dL (ref 0.20–1.20)

## 2015-11-12 ENCOUNTER — Other Ambulatory Visit (HOSPITAL_BASED_OUTPATIENT_CLINIC_OR_DEPARTMENT_OTHER): Payer: Medicare Other

## 2015-11-12 DIAGNOSIS — C3491 Malignant neoplasm of unspecified part of right bronchus or lung: Secondary | ICD-10-CM | POA: Diagnosis not present

## 2015-11-12 LAB — COMPREHENSIVE METABOLIC PANEL (CC13)
ALBUMIN: 2.7 g/dL — AB (ref 3.5–5.0)
ALK PHOS: 124 U/L (ref 40–150)
ALT: 29 U/L (ref 0–55)
AST: 44 U/L — AB (ref 5–34)
Anion Gap: 8 mEq/L (ref 3–11)
BILIRUBIN TOTAL: 0.46 mg/dL (ref 0.20–1.20)
BUN: 11.7 mg/dL (ref 7.0–26.0)
CO2: 29 mEq/L (ref 22–29)
Calcium: 9.1 mg/dL (ref 8.4–10.4)
Chloride: 103 mEq/L (ref 98–109)
Creatinine: 0.7 mg/dL (ref 0.6–1.1)
EGFR: 82 mL/min/{1.73_m2} — ABNORMAL LOW (ref >90)
GLUCOSE: 87 mg/dL (ref 70–140)
POTASSIUM: 3.5 meq/L (ref 3.5–5.1)
SODIUM: 140 meq/L (ref 136–145)
TOTAL PROTEIN: 5.4 g/dL — AB (ref 6.4–8.3)

## 2015-11-12 LAB — CBC WITH DIFFERENTIAL/PLATELET
BASO%: 1.1 % (ref 0.0–2.0)
BASOS ABS: 0 10*3/uL (ref 0.0–0.1)
EOS ABS: 0.1 10*3/uL (ref 0.0–0.5)
EOS%: 1.7 % (ref 0.0–7.0)
HCT: 26 % — ABNORMAL LOW (ref 34.8–46.6)
HGB: 8.9 g/dL — ABNORMAL LOW (ref 11.6–15.9)
LYMPH%: 31.5 % (ref 14.0–49.7)
MCH: 29.8 pg (ref 25.1–34.0)
MCHC: 34.2 g/dL (ref 31.5–36.0)
MCV: 87.2 fL (ref 79.5–101.0)
MONO#: 0.8 10*3/uL (ref 0.1–0.9)
MONO%: 23.7 % — AB (ref 0.0–14.0)
NEUT%: 42 % (ref 38.4–76.8)
NEUTROS ABS: 1.4 10*3/uL — AB (ref 1.5–6.5)
PLATELETS: 162 10*3/uL (ref 145–400)
RBC: 2.99 10*6/uL — AB (ref 3.70–5.45)
RDW: 17.5 % — ABNORMAL HIGH (ref 11.2–14.5)
WBC: 3.2 10*3/uL — AB (ref 3.9–10.3)
lymph#: 1 10*3/uL (ref 0.9–3.3)

## 2015-11-16 ENCOUNTER — Encounter (HOSPITAL_COMMUNITY): Payer: Self-pay

## 2015-11-16 ENCOUNTER — Ambulatory Visit (HOSPITAL_COMMUNITY)
Admission: RE | Admit: 2015-11-16 | Discharge: 2015-11-16 | Disposition: A | Discharge status: Home / Self Care | Payer: Medicare Other | Source: Ambulatory Visit | Attending: Internal Medicine | Admitting: Internal Medicine

## 2015-11-16 DIAGNOSIS — R590 Localized enlarged lymph nodes: Secondary | ICD-10-CM | POA: Diagnosis not present

## 2015-11-16 DIAGNOSIS — Z5111 Encounter for antineoplastic chemotherapy: Secondary | ICD-10-CM

## 2015-11-16 DIAGNOSIS — C3491 Malignant neoplasm of unspecified part of right bronchus or lung: Secondary | ICD-10-CM | POA: Insufficient documentation

## 2015-11-16 DIAGNOSIS — J910 Malignant pleural effusion: Secondary | ICD-10-CM | POA: Diagnosis not present

## 2015-11-16 DIAGNOSIS — J90 Pleural effusion, not elsewhere classified: Secondary | ICD-10-CM | POA: Diagnosis not present

## 2015-11-16 DIAGNOSIS — C3411 Malignant neoplasm of upper lobe, right bronchus or lung: Secondary | ICD-10-CM | POA: Diagnosis not present

## 2015-11-16 DIAGNOSIS — Z923 Personal history of irradiation: Secondary | ICD-10-CM | POA: Diagnosis not present

## 2015-11-16 MED ORDER — IOHEXOL 300 MG/ML  SOLN
100.0000 mL | Freq: Once | INTRAMUSCULAR | Status: AC | PRN
  Administered 2015-11-16: 100 mL via INTRAVENOUS

## 2015-11-18 ENCOUNTER — Ambulatory Visit (HOSPITAL_BASED_OUTPATIENT_CLINIC_OR_DEPARTMENT_OTHER): Payer: Medicare Other | Admitting: Internal Medicine

## 2015-11-18 ENCOUNTER — Ambulatory Visit (HOSPITAL_BASED_OUTPATIENT_CLINIC_OR_DEPARTMENT_OTHER): Payer: Medicare Other

## 2015-11-18 ENCOUNTER — Other Ambulatory Visit (HOSPITAL_BASED_OUTPATIENT_CLINIC_OR_DEPARTMENT_OTHER): Payer: Medicare Other

## 2015-11-18 ENCOUNTER — Encounter: Payer: Self-pay | Admitting: Internal Medicine

## 2015-11-18 VITALS — BP 149/76 | HR 107 | Temp 97.8°F | Resp 18 | Ht 69.0 in | Wt 144.8 lb

## 2015-11-18 DIAGNOSIS — C3491 Malignant neoplasm of unspecified part of right bronchus or lung: Secondary | ICD-10-CM

## 2015-11-18 DIAGNOSIS — Z5111 Encounter for antineoplastic chemotherapy: Secondary | ICD-10-CM

## 2015-11-18 DIAGNOSIS — F419 Anxiety disorder, unspecified: Secondary | ICD-10-CM | POA: Diagnosis not present

## 2015-11-18 DIAGNOSIS — C3411 Malignant neoplasm of upper lobe, right bronchus or lung: Secondary | ICD-10-CM

## 2015-11-18 LAB — COMPREHENSIVE METABOLIC PANEL (CC13)
ALBUMIN: 3.1 g/dL — AB (ref 3.5–5.0)
ALK PHOS: 127 U/L (ref 40–150)
ALT: 16 U/L (ref 0–55)
AST: 33 U/L (ref 5–34)
Anion Gap: 11 mEq/L (ref 3–11)
BILIRUBIN TOTAL: 0.42 mg/dL (ref 0.20–1.20)
BUN: 10.6 mg/dL (ref 7.0–26.0)
CO2: 22 mEq/L (ref 22–29)
Calcium: 9.9 mg/dL (ref 8.4–10.4)
Chloride: 107 mEq/L (ref 98–109)
Creatinine: 0.8 mg/dL (ref 0.6–1.1)
EGFR: 77 mL/min/{1.73_m2} — AB (ref >90)
GLUCOSE: 121 mg/dL (ref 70–140)
Potassium: 4 mEq/L (ref 3.5–5.1)
SODIUM: 140 meq/L (ref 136–145)
TOTAL PROTEIN: 6.6 g/dL (ref 6.4–8.3)

## 2015-11-18 LAB — CBC WITH DIFFERENTIAL/PLATELET
BASO%: 0.3 % (ref 0.0–2.0)
Basophils Absolute: 0 10*3/uL (ref 0.0–0.1)
EOS ABS: 0 10*3/uL (ref 0.0–0.5)
EOS%: 0 % (ref 0.0–7.0)
HCT: 33.4 % — ABNORMAL LOW (ref 34.8–46.6)
HEMOGLOBIN: 11.2 g/dL — AB (ref 11.6–15.9)
LYMPH%: 20.9 % (ref 14.0–49.7)
MCH: 30.2 pg (ref 25.1–34.0)
MCHC: 33.5 g/dL (ref 31.5–36.0)
MCV: 90 fL (ref 79.5–101.0)
MONO#: 0.9 10*3/uL (ref 0.1–0.9)
MONO%: 13.8 % (ref 0.0–14.0)
NEUT%: 65 % (ref 38.4–76.8)
NEUTROS ABS: 4.4 10*3/uL (ref 1.5–6.5)
PLATELETS: 386 10*3/uL (ref 145–400)
RBC: 3.71 10*6/uL (ref 3.70–5.45)
RDW: 18.2 % — ABNORMAL HIGH (ref 11.2–14.5)
WBC: 6.7 10*3/uL (ref 3.9–10.3)
lymph#: 1.4 10*3/uL (ref 0.9–3.3)

## 2015-11-18 MED ORDER — SODIUM CHLORIDE 0.9 % IV SOLN
Freq: Once | INTRAVENOUS | Status: AC
  Administered 2015-11-18: 12:00:00 via INTRAVENOUS

## 2015-11-18 MED ORDER — SODIUM CHLORIDE 0.9 % IV SOLN
408.5000 mg | Freq: Once | INTRAVENOUS | Status: AC
  Administered 2015-11-18: 410 mg via INTRAVENOUS
  Filled 2015-11-18: qty 41

## 2015-11-18 MED ORDER — SODIUM CHLORIDE 0.9 % IV SOLN
500.0000 mg/m2 | Freq: Once | INTRAVENOUS | Status: AC
  Administered 2015-11-18: 900 mg via INTRAVENOUS
  Filled 2015-11-18: qty 36

## 2015-11-18 MED ORDER — SODIUM CHLORIDE 0.9 % IV SOLN
Freq: Once | INTRAVENOUS | Status: AC
  Administered 2015-11-18: 13:00:00 via INTRAVENOUS
  Filled 2015-11-18: qty 8

## 2015-11-18 NOTE — Patient Instructions (Signed)
[facility] Discharge Instructions for Patients Receiving Chemotherapy  Today you received the following chemotherapy agents ALIMTA, CARBOPLATIN To help prevent nausea and vomiting after your treatment, we encourage you to take your nausea medication as prescribed.   If you develop nausea and vomiting that is not controlled by your nausea medication, call the clinic.   BELOW ARE SYMPTOMS THAT SHOULD BE REPORTED IMMEDIATELY:  *FEVER GREATER THAN 100.5 F  *CHILLS WITH OR WITHOUT FEVER  NAUSEA AND VOMITING THAT IS NOT CONTROLLED WITH YOUR NAUSEA MEDICATION  *UNUSUAL SHORTNESS OF BREATH  *UNUSUAL BRUISING OR BLEEDING  TENDERNESS IN MOUTH AND THROAT WITH OR WITHOUT PRESENCE OF ULCERS  *URINARY PROBLEMS  *BOWEL PROBLEMS  UNUSUAL RASH Items with * indicate a potential emergency and should be followed up as soon as possible.  Feel free to call the clinic you have any questions or concerns. The clinic phone number is [phone].  Please show the [facility] at check-in to the Emergency Department and triage nurse.

## 2015-11-18 NOTE — Progress Notes (Signed)
[facility] [phone]   [phone]  OFFICE PROGRESS NOTE  [name], MD [place] [address]  DIAGNOSIS: Stage IV (T1b, N2, M1a) non-small cell lung cancer, adenocarcinoma with negative EGFR mutation, negative ALK gene translocation, presenting with right upper lobe lung mass in addition to mediastinal lymphadenopathy and malignant pleural effusion as well as innumerable bilateral pulmonary nodules.  PRIOR THERAPY: None  CURRENT THERAPY: Systemic chemotherapy with carboplatin for AUC of 5 and Alimta 500 MG/M2 every 3 weeks. First dose on 09/16/2015. Status post 3 cycles.  INTERVAL HISTORY: Deborah [name] 69 y.o. female returns to the clinic today for follow-up visit accompanied by her husband. The patient is tolerating her systemic chemotherapy with carboplatin and Alimta fairly well. She denied having any significant fever or chills, no weight loss or night sweats. She denied having any significant nausea or vomiting. The patient denied having any significant chest pain, shortness breath, cough or hemoptysis. She had repeat CT scan of the chest, abdomen and pelvis performed recently and she is here for evaluation and discussion of her scan results.  MEDICAL HISTORY: Past Medical History  Diagnosis Date  . Allergy     Mold  . Hyperlipidemia 08/04/2011  . Hypothyroidism 08/04/2011  . Atrophic vaginitis   . 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 Basal cell carcinoma of skin 2010  . Osteopenia 10/2012    T score -1.7 FRAX 7%/0.3%  . Vertigo   . Non-small cell lung cancer [facility]) September 2016    Adenocarcinoma  . Shortness of breath dyspnea   . Meniere's disease   . Encounter for antineoplastic chemotherapy 10/28/2015    ALLERGIES:  has No Known Allergies.  MEDICATIONS:  Current Outpatient Prescriptions  Medication Sig Dispense Refill  . albuterol (PROVENTIL  HFA;VENTOLIN HFA) 108 (90 BASE) MCG/ACT inhaler Inhale 2 puffs into the lungs every 6 (six) hours as needed for wheezing or shortness of breath. 1 Inhaler 1  . Cholecalciferol (VITAMIN D-3) 1000 UNITS CAPS Take 1 capsule by mouth daily.     Marland Kitchen dexamethasone (DECADRON) 4 MG tablet 4 mg po bid the day before, day of and day after chemo 40 tablet 1  . folic acid (FOLVITE) 1 MG tablet Take 1 tablet (1 mg total) by mouth daily. 30 tablet 3  . HYDROcodone-homatropine (HYCODAN) 5-1.5 MG/5ML syrup Take 5 mLs by mouth every 6 (six) hours as needed for cough. 120 mL 0  . levothyroxine (SYNTHROID, LEVOTHROID) 75 MCG tablet TAKE 1 TABLET BY MOUTH DAILY. 90 tablet 3  . LORazepam (ATIVAN) 0.5 MG tablet Take one every eight hours as needed for anxiety.  Prefers one at bedtime. 30 tablet 0  . ondansetron (ZOFRAN) 8 MG tablet Take 1 tablet (8 mg total) by mouth every 8 (eight) hours as needed for nausea or vomiting. 20 tablet 0  . prochlorperazine (COMPAZINE) 10 MG tablet Take 1 tablet (10 mg total) by mouth every 6 (six) hours as needed for nausea or vomiting. 30 tablet 0  . triamterene-hydrochlorothiazide (DYAZIDE) 37.5-25 MG per capsule Take 1 capsule by mouth daily as needed.      No current facility-administered medications for this visit.    SURGICAL HISTORY:  Past Surgical History  Procedure Laterality Date  . Tonsillectomy  1952  . Vaginal hysterectomy  1980  . Breast biopsy  1999    RADIATION TREATMENTS  .  Breast lumpectomy Right 1998    right  . Chest tube insertion Right 09/15/2015    Procedure: INSERTION RIGHT PLEURAL DRAINAGE CATHETER;  Surgeon: Melrose Nakayama, MD;  Location: [facility];  Service: Thoracic;  Laterality: Right;    REVIEW OF SYSTEMS:  Constitutional: positive for fatigue Eyes: negative Ears, nose, mouth, throat, and face: negative Respiratory: negative Cardiovascular: negative Gastrointestinal: negative Genitourinary:negative Integument/breast:  negative Hematologic/lymphatic: negative Musculoskeletal:negative Neurological: negative Behavioral/Psych: negative Endocrine: negative Allergic/Immunologic: negative   PHYSICAL EXAMINATION: General appearance: alert, cooperative, fatigued and no distress Head: Normocephalic, without obvious abnormality, atraumatic Neck: no adenopathy, no JVD, supple, symmetrical, trachea midline and thyroid not enlarged, symmetric, no tenderness/mass/nodules Lymph nodes: Cervical, supraclavicular, and axillary nodes normal. Resp: diminished breath sounds RLL and dullness to percussion RLL Back: symmetric, no curvature. ROM normal. No CVA tenderness. Cardio: regular rate and rhythm, S1, S2 normal, no murmur, click, rub or gallop GI: soft, non-tender; bowel sounds normal; no masses,  no organomegaly Extremities: extremities normal, atraumatic, no cyanosis or edema Neurologic: Alert and oriented X 3, normal strength and tone. Normal symmetric reflexes. Normal coordination and gait  ECOG PERFORMANCE STATUS: 1 - Symptomatic but completely ambulatory  Blood pressure 149/76, pulse 107, temperature 97.8 F (36.6 C), temperature source Oral, resp. rate 18, height _0  (1.753 m), weight 144 lb 12.8 oz (65.681 kg), last menstrual period 10/11/1979, SpO2 98 %.  LABORATORY DATA: Lab Results  Component Value Date   WBC 6.7 11/18/2015   HGB 11.2* 11/18/2015   HCT 33.4* 11/18/2015   MCV 90.0 11/18/2015   PLT 386 11/18/2015      Chemistry      Component Value Date/Time   NA 140 11/12/2015 1006   NA 139 09/15/2015 1210   K 3.5 11/12/2015 1006   K 4.5 09/15/2015 1210   CL 107 09/15/2015 1210   CO2 29 11/12/2015 1006   CO2 23 09/15/2015 1210   BUN 11.7 11/12/2015 1006   BUN 13 09/15/2015 1210   CREATININE 0.7 11/12/2015 1006   CREATININE 0.86 09/15/2015 1210      Component Value Date/Time   CALCIUM 9.1 11/12/2015 1006   CALCIUM 9.5 09/15/2015 1210   ALKPHOS 124 11/12/2015 1006   ALKPHOS 95  09/15/2015 1210   AST 44* 11/12/2015 1006   AST 41 09/15/2015 1210   ALT 29 11/12/2015 1006   ALT 12* 09/15/2015 1210   BILITOT 0.46 11/12/2015 1006   BILITOT 0.7 09/15/2015 1210       RADIOGRAPHIC STUDIES: Dg Chest 2 View  10/26/2015  CLINICAL DATA:  Non-small cell lung cancer.  Right-sided effusion. EXAM: CHEST - 2 VIEW COMPARISON:  Two-view chest x-ray and 09/15/2015. FINDINGS: The heart size is normal. There is significant reduction in the right pleural effusion following placement of a pleural catheter. There is some persistent fluid within the minor fissure is well is the known right middle lobe and lower lobe neoplasm. The left lung is clear. The visualized soft tissues and bony thorax are unremarkable. IMPRESSION: 1. Significant reduction in right pleural effusion following placement of pleural catheter. 2. Persistent opacities compatible with neoplasm within the right middle and lower lobes. Electronically Signed   By: San Morelle M.D.   On: 10/26/2015 14:33   Ct Chest W Contrast  11/16/2015  CLINICAL DATA:  Restaging stage IV right lung adenocarcinoma diagnosed in September 2016 with ongoing chemotherapy. EXAM: CT CHEST, ABDOMEN, AND PELVIS WITH CONTRAST TECHNIQUE: Multidetector CT imaging of the chest, abdomen and pelvis was performed following  the standard protocol during bolus administration of intravenous contrast. CONTRAST:  173m OMNIPAQUE IOHEXOL 300 MG/ML  SOLN COMPARISON:  09/01/2015 PET-CT. FINDINGS: CT CHEST FINDINGS Mediastinum/Nodes: Normal heart size. Trace pericardial fluid/ thickening, slightly decreased. Great vessels are normal in course and caliber. No central pulmonary emboli. Normal visualized thyroid. Normal esophagus. No pathologically enlarged axillary nodes. The previously described hypermetabolic 0.6 cm left axillary node (series 2/ image 16) is stable. Mildly enlarged 1.2 cm right paratracheal node (2/17) is mildly decreased from 1.5 cm. Mildly enlarged  1.0 cm subcarinal node (2/20) is decreased from 1.8 cm. Mildly enlarged 1.0 cm right hilar node (2/27) is likely slightly decreased from 1.1 cm. No pathologically enlarged left hilar nodes. No residual pathologically enlarged supraclavicular or internal mammary lymph nodes. Lungs/Pleura: No pneumothorax. Small right pleural effusion is decreased, with likely mild anterior loculation. There is diffuse pleural thickening and hyper enhancement throughout the right pleural space, which is mildly irregular and measures up to 6 mm in thickness, not appreciably changed. No left pleural effusion. Extensive infiltrative peribronchovascular consolidation throughout the central right lung and interlobular septal thickening throughout the peripheral right lung is not appreciably changed. A representative 3.2 x 1.6 cm focus of peribronchovascular consolidation in the right upper lobe (series 5/ image 21) previously measured 3.1 x 1.6 cm on 09/01/2015, not appreciably changed. There is overall slightly improved aeration in the right lung. Re- demonstrated are innumerable lower lung zone predominant pulmonary nodules throughout both lungs (greater than 50), not appreciably changed, for example a stable 1.0 cm left lower lobe pulmonary nodule (series 5/ image 37). Musculoskeletal: No new focal osseous lesions. Tiny nonspecific sclerotic foci in the lower manubrium and lateral right second and third ribs are stable and were not associated with FDG uptake on the prior PET-CT study. Mild degenerative changes in the thoracic spine. Surgical clips are present in the lateral right breast. CT ABDOMEN PELVIS FINDINGS Hepatobiliary: Normal liver with no liver mass. Normal gallbladder with no radiopaque cholelithiasis. No biliary ductal dilatation. Pancreas: Normal, with no mass or duct dilation. Spleen: Normal size. No mass. Adrenals/Urinary Tract: Normal adrenals. Simple 1.0 cm renal cyst in the medial lower left kidney. Separate  subcentimeter hypodense renal cortical lesions in the upper right kidney and anterior lower left kidney, too small to characterize. No hydronephrosis. Normal bladder. Stomach/Bowel: Grossly normal stomach. Normal caliber small bowel with no small bowel wall thickening. Normal appendix . Normal large bowel with no diverticulosis, large bowel wall thickening or pericolonic fat stranding. Vascular/Lymphatic: Atherosclerotic nonaneurysmal abdominal aorta. Patent portal, splenic, hepatic and renal veins. No pathologically enlarged lymph nodes in the abdomen or pelvis. Reproductive: Status post hysterectomy, with no abnormal findings at the vaginal cuff. No adnexal mass. Other: No pneumoperitoneum, ascites or focal fluid collection. Musculoskeletal: No new focal osseous lesions. Mild-to-moderate degenerative changes in the lumbar spine. Stable nonspecific subcentimeter sclerotic foci in the bilateral pelvic girdle and lower lumbar spine, which were not associated with FDG uptake on the prior PET-CT study. IMPRESSION: 1. Resolved supraclavicular, internal mammary and left hilar adenopathy. Decreased paratracheal, subcarinal and right hilar adenopathy. 2. Small malignant right pleural effusion, decreased. Trace pericardial fluid/thickening, decreased. 3. No appreciable change in extensive infiltrative peribronchovascular tumor throughout the central right lung. 4. No appreciable change in pulmonary metastatic burden. 5. No evidence of metastatic disease in the abdomen or pelvis. Electronically Signed   By: JIlona SorrelM.D.   On: 11/16/2015 16:17   Ct Abdomen Pelvis W Contrast  11/16/2015  CLINICAL DATA:  Restaging stage IV right lung adenocarcinoma diagnosed in September 2016 with ongoing chemotherapy. EXAM: CT CHEST, ABDOMEN, AND PELVIS WITH CONTRAST TECHNIQUE: Multidetector CT imaging of the chest, abdomen and pelvis was performed following the standard protocol during bolus administration of intravenous contrast.  CONTRAST:  182m OMNIPAQUE IOHEXOL 300 MG/ML  SOLN COMPARISON:  09/01/2015 PET-CT. FINDINGS: CT CHEST FINDINGS Mediastinum/Nodes: Normal heart size. Trace pericardial fluid/ thickening, slightly decreased. Great vessels are normal in course and caliber. No central pulmonary emboli. Normal visualized thyroid. Normal esophagus. No pathologically enlarged axillary nodes. The previously described hypermetabolic 0.6 cm left axillary node (series 2/ image 16) is stable. Mildly enlarged 1.2 cm right paratracheal node (2/17) is mildly decreased from 1.5 cm. Mildly enlarged 1.0 cm subcarinal node (2/20) is decreased from 1.8 cm. Mildly enlarged 1.0 cm right hilar node (2/27) is likely slightly decreased from 1.1 cm. No pathologically enlarged left hilar nodes. No residual pathologically enlarged supraclavicular or internal mammary lymph nodes. Lungs/Pleura: No pneumothorax. Small right pleural effusion is decreased, with likely mild anterior loculation. There is diffuse pleural thickening and hyper enhancement throughout the right pleural space, which is mildly irregular and measures up to 6 mm in thickness, not appreciably changed. No left pleural effusion. Extensive infiltrative peribronchovascular consolidation throughout the central right lung and interlobular septal thickening throughout the peripheral right lung is not appreciably changed. A representative 3.2 x 1.6 cm focus of peribronchovascular consolidation in the right upper lobe (series 5/ image 21) previously measured 3.1 x 1.6 cm on 09/01/2015, not appreciably changed. There is overall slightly improved aeration in the right lung. Re- demonstrated are innumerable lower lung zone predominant pulmonary nodules throughout both lungs (greater than 50), not appreciably changed, for example a stable 1.0 cm left lower lobe pulmonary nodule (series 5/ image 37). Musculoskeletal: No new focal osseous lesions. Tiny nonspecific sclerotic foci in the lower manubrium and  lateral right second and third ribs are stable and were not associated with FDG uptake on the prior PET-CT study. Mild degenerative changes in the thoracic spine. Surgical clips are present in the lateral right breast. CT ABDOMEN PELVIS FINDINGS Hepatobiliary: Normal liver with no liver mass. Normal gallbladder with no radiopaque cholelithiasis. No biliary ductal dilatation. Pancreas: Normal, with no mass or duct dilation. Spleen: Normal size. No mass. Adrenals/Urinary Tract: Normal adrenals. Simple 1.0 cm renal cyst in the medial lower left kidney. Separate subcentimeter hypodense renal cortical lesions in the upper right kidney and anterior lower left kidney, too small to characterize. No hydronephrosis. Normal bladder. Stomach/Bowel: Grossly normal stomach. Normal caliber small bowel with no small bowel wall thickening. Normal appendix . Normal large bowel with no diverticulosis, large bowel wall thickening or pericolonic fat stranding. Vascular/Lymphatic: Atherosclerotic nonaneurysmal abdominal aorta. Patent portal, splenic, hepatic and renal veins. No pathologically enlarged lymph nodes in the abdomen or pelvis. Reproductive: Status post hysterectomy, with no abnormal findings at the vaginal cuff. No adnexal mass. Other: No pneumoperitoneum, ascites or focal fluid collection. Musculoskeletal: No new focal osseous lesions. Mild-to-moderate degenerative changes in the lumbar spine. Stable nonspecific subcentimeter sclerotic foci in the bilateral pelvic girdle and lower lumbar spine, which were not associated with FDG uptake on the prior PET-CT study. IMPRESSION: 1. Resolved supraclavicular, internal mammary and left hilar adenopathy. Decreased paratracheal, subcarinal and right hilar adenopathy. 2. Small malignant right pleural effusion, decreased. Trace pericardial fluid/thickening, decreased. 3. No appreciable change in extensive infiltrative peribronchovascular tumor throughout the central right lung. 4. No  appreciable change in pulmonary metastatic burden. 5. No  evidence of metastatic disease in the abdomen or pelvis. Electronically Signed   By: Ilona Sorrel M.D.   On: 11/16/2015 16:17    ASSESSMENT AND PLAN: This is a very pleasant 69 years old white female recently diagnosed with stage IV non-small cell lung cancer, adenocarcinoma with no detectable actionable mutations. The patient was started on systemic chemotherapy with carboplatin and Alimta status post 2 cycles of treatment and tolerated the 3 cycles of her treatment fairly well.  The recent CT scan of the chest, abdomen and pelvis showed resolved supraclavicular, internal mammary and left hilar adenopathy as well as decreased paratracheal and subcarinal and right hilar adenopathy. There was also decrease in the size of the right pleural effusion.  I discussed the scan results and showed the images to the patient and her husband.  I recommended for her to continue her current treatment with carboplatin and Alimta as a scheduled.  The patient will proceed with cycle #4 today. She would come back for follow-up visit in 3 weeks for reevaluation before starting cycle #5. The patient was advised to call immediately if she has any concerning symptoms in the interval. The patient voices understanding of current disease status and treatment options and is in agreement with the current care plan.  All questions were answered. The patient knows to call the clinic with any problems, questions or concerns. We can certainly see the patient much sooner if necessary.  Disclaimer: This note was dictated with voice recognition software. Similar sounding words can inadvertently be transcribed and may not be corrected upon review.

## 2015-11-19 ENCOUNTER — Telehealth: Payer: Self-pay | Admitting: Medical Oncology

## 2015-11-19 NOTE — Telephone Encounter (Signed)
Husband notified. He will tell pt and call back .

## 2015-11-19 NOTE — Telephone Encounter (Signed)
-----   Message from Curt Bears, MD sent at 11/19/2015 11:34 AM EST ----- Regarding: RE: postpone chemo dec 22 to 12/29 Probably but not much. She can enjoy her holiday and come back on 12/29. ----- Message -----    From: Ardeen Garland, RN    Sent: 11/18/2015   2:18 PM      To: Lucile Crater, RN, Curt Bears, MD Subject: postpone chemo dec 22 to 12/29                 She wants to know 1.  if she postpones her treatment on dec 22 to 12/29  Will it jeopardize her response to chemo?

## 2015-11-19 NOTE — Telephone Encounter (Signed)
Legaci does not want to postpone her chemo appts.

## 2015-11-25 ENCOUNTER — Other Ambulatory Visit (HOSPITAL_BASED_OUTPATIENT_CLINIC_OR_DEPARTMENT_OTHER): Payer: Medicare Other

## 2015-11-25 DIAGNOSIS — C3491 Malignant neoplasm of unspecified part of right bronchus or lung: Secondary | ICD-10-CM | POA: Diagnosis not present

## 2015-11-25 LAB — COMPREHENSIVE METABOLIC PANEL
ALT: 27 U/L (ref 0–55)
AST: 41 U/L — ABNORMAL HIGH (ref 5–34)
Albumin: 3.3 g/dL — ABNORMAL LOW (ref 3.5–5.0)
Alkaline Phosphatase: 137 U/L (ref 40–150)
Anion Gap: 13 mEq/L — ABNORMAL HIGH (ref 3–11)
BILIRUBIN TOTAL: 0.92 mg/dL (ref 0.20–1.20)
BUN: 18 mg/dL (ref 7.0–26.0)
CO2: 23 meq/L (ref 22–29)
Calcium: 9.6 mg/dL (ref 8.4–10.4)
Chloride: 99 mEq/L (ref 98–109)
Creatinine: 0.8 mg/dL (ref 0.6–1.1)
EGFR: 71 mL/min/{1.73_m2} — AB (ref >90)
GLUCOSE: 85 mg/dL (ref 70–140)
Potassium: 3.3 mEq/L — ABNORMAL LOW (ref 3.5–5.1)
SODIUM: 135 meq/L — AB (ref 136–145)
TOTAL PROTEIN: 6.5 g/dL (ref 6.4–8.3)

## 2015-11-25 LAB — CBC WITH DIFFERENTIAL/PLATELET
BASO%: 0.6 % (ref 0.0–2.0)
Basophils Absolute: 0 10*3/uL (ref 0.0–0.1)
EOS%: 0.4 % (ref 0.0–7.0)
Eosinophils Absolute: 0 10*3/uL (ref 0.0–0.5)
HCT: 33.7 % — ABNORMAL LOW (ref 34.8–46.6)
HGB: 11.6 g/dL (ref 11.6–15.9)
LYMPH%: 38.5 % (ref 14.0–49.7)
MCH: 30.3 pg (ref 25.1–34.0)
MCHC: 34.4 g/dL (ref 31.5–36.0)
MCV: 88 fL (ref 79.5–101.0)
MONO#: 0.5 10*3/uL (ref 0.1–0.9)
MONO%: 9 % (ref 0.0–14.0)
NEUT%: 51.5 % (ref 38.4–76.8)
NEUTROS ABS: 2.6 10*3/uL (ref 1.5–6.5)
Platelets: 168 10*3/uL (ref 145–400)
RBC: 3.83 10*6/uL (ref 3.70–5.45)
RDW: 16.4 % — ABNORMAL HIGH (ref 11.2–14.5)
WBC: 5 10*3/uL (ref 3.9–10.3)
lymph#: 1.9 10*3/uL (ref 0.9–3.3)

## 2015-11-26 ENCOUNTER — Other Ambulatory Visit: Payer: Self-pay | Admitting: Medical Oncology

## 2015-11-26 DIAGNOSIS — T451X5A Adverse effect of antineoplastic and immunosuppressive drugs, initial encounter: Secondary | ICD-10-CM

## 2015-11-26 DIAGNOSIS — C3491 Malignant neoplasm of unspecified part of right bronchus or lung: Secondary | ICD-10-CM

## 2015-11-26 DIAGNOSIS — R110 Nausea: Secondary | ICD-10-CM

## 2015-11-26 MED ORDER — LORAZEPAM 0.5 MG PO TABS: ORAL_TABLET | ORAL | Status: DC

## 2015-11-26 NOTE — Progress Notes (Signed)
Ativan refill called to [facility]

## 2015-11-29 ENCOUNTER — Other Ambulatory Visit: Payer: Self-pay | Admitting: Thoracic Surgery (Cardiothoracic Vascular Surgery)

## 2015-11-29 DIAGNOSIS — C3490 Malignant neoplasm of unspecified part of unspecified bronchus or lung: Secondary | ICD-10-CM

## 2015-11-30 ENCOUNTER — Ambulatory Visit: Payer: Medicare Other | Admitting: Thoracic Surgery (Cardiothoracic Vascular Surgery)

## 2015-12-02 ENCOUNTER — Other Ambulatory Visit (HOSPITAL_BASED_OUTPATIENT_CLINIC_OR_DEPARTMENT_OTHER): Payer: Medicare Other

## 2015-12-02 DIAGNOSIS — C3491 Malignant neoplasm of unspecified part of right bronchus or lung: Secondary | ICD-10-CM

## 2015-12-02 LAB — CBC WITH DIFFERENTIAL/PLATELET
BASO%: 0.3 % (ref 0.0–2.0)
Basophils Absolute: 0 10*3/uL (ref 0.0–0.1)
EOS ABS: 0 10*3/uL (ref 0.0–0.5)
EOS%: 1.1 % (ref 0.0–7.0)
HCT: 29.4 % — ABNORMAL LOW (ref 34.8–46.6)
HEMOGLOBIN: 9.9 g/dL — AB (ref 11.6–15.9)
LYMPH%: 39.1 % (ref 14.0–49.7)
MCH: 30.4 pg (ref 25.1–34.0)
MCHC: 33.7 g/dL (ref 31.5–36.0)
MCV: 90.2 fL (ref 79.5–101.0)
MONO#: 0.6 10*3/uL (ref 0.1–0.9)
MONO%: 17.5 % — AB (ref 0.0–14.0)
NEUT%: 42 % (ref 38.4–76.8)
NEUTROS ABS: 1.5 10*3/uL (ref 1.5–6.5)
PLATELETS: 93 10*3/uL — AB (ref 145–400)
RBC: 3.26 10*6/uL — ABNORMAL LOW (ref 3.70–5.45)
RDW: 17.2 % — ABNORMAL HIGH (ref 11.2–14.5)
WBC: 3.5 10*3/uL — AB (ref 3.9–10.3)
lymph#: 1.4 10*3/uL (ref 0.9–3.3)

## 2015-12-02 LAB — COMPREHENSIVE METABOLIC PANEL
ALBUMIN: 3.1 g/dL — AB (ref 3.5–5.0)
ALK PHOS: 125 U/L (ref 40–150)
ALT: 25 U/L (ref 0–55)
ANION GAP: 10 meq/L (ref 3–11)
AST: 43 U/L — ABNORMAL HIGH (ref 5–34)
BILIRUBIN TOTAL: 0.61 mg/dL (ref 0.20–1.20)
BUN: 8.1 mg/dL (ref 7.0–26.0)
CO2: 26 meq/L (ref 22–29)
CREATININE: 0.8 mg/dL (ref 0.6–1.1)
Calcium: 9.6 mg/dL (ref 8.4–10.4)
Chloride: 102 mEq/L (ref 98–109)
EGFR: 74 mL/min/{1.73_m2} — AB (ref >90)
GLUCOSE: 90 mg/dL (ref 70–140)
Potassium: 3.4 mEq/L — ABNORMAL LOW (ref 3.5–5.1)
SODIUM: 138 meq/L (ref 136–145)
TOTAL PROTEIN: 6.4 g/dL (ref 6.4–8.3)

## 2015-12-06 ENCOUNTER — Telehealth: Payer: Self-pay | Admitting: Internal Medicine

## 2015-12-06 NOTE — Telephone Encounter (Signed)
Per MM moved 12/22 f/u from 11:30 am to 11 am. Adjusted remaining appointments. Left message for patient and mailed schedule.

## 2015-12-09 ENCOUNTER — Ambulatory Visit (HOSPITAL_BASED_OUTPATIENT_CLINIC_OR_DEPARTMENT_OTHER): Payer: Medicare Other

## 2015-12-09 ENCOUNTER — Other Ambulatory Visit (HOSPITAL_BASED_OUTPATIENT_CLINIC_OR_DEPARTMENT_OTHER): Payer: Medicare Other

## 2015-12-09 ENCOUNTER — Ambulatory Visit (HOSPITAL_BASED_OUTPATIENT_CLINIC_OR_DEPARTMENT_OTHER): Payer: Medicare Other | Admitting: Internal Medicine

## 2015-12-09 ENCOUNTER — Encounter: Payer: Self-pay | Admitting: Internal Medicine

## 2015-12-09 VITALS — BP 126/75 | HR 102 | Temp 98.0°F | Resp 18 | Ht 69.0 in | Wt 136.8 lb

## 2015-12-09 DIAGNOSIS — Z5111 Encounter for antineoplastic chemotherapy: Secondary | ICD-10-CM | POA: Diagnosis not present

## 2015-12-09 DIAGNOSIS — C3491 Malignant neoplasm of unspecified part of right bronchus or lung: Secondary | ICD-10-CM

## 2015-12-09 DIAGNOSIS — C3411 Malignant neoplasm of upper lobe, right bronchus or lung: Secondary | ICD-10-CM

## 2015-12-09 LAB — CBC WITH DIFFERENTIAL/PLATELET
BASO%: 0.6 % (ref 0.0–2.0)
BASOS ABS: 0 10*3/uL (ref 0.0–0.1)
EOS ABS: 0 10*3/uL (ref 0.0–0.5)
EOS%: 0 % (ref 0.0–7.0)
HEMATOCRIT: 31.9 % — AB (ref 34.8–46.6)
HGB: 10.7 g/dL — ABNORMAL LOW (ref 11.6–15.9)
LYMPH#: 1.1 10*3/uL (ref 0.9–3.3)
LYMPH%: 22 % (ref 14.0–49.7)
MCH: 30.3 pg (ref 25.1–34.0)
MCHC: 33.4 g/dL (ref 31.5–36.0)
MCV: 90.8 fL (ref 79.5–101.0)
MONO#: 0.5 10*3/uL (ref 0.1–0.9)
MONO%: 9.9 % (ref 0.0–14.0)
NEUT#: 3.4 10*3/uL (ref 1.5–6.5)
NEUT%: 67.5 % (ref 38.4–76.8)
PLATELETS: 352 10*3/uL (ref 145–400)
RBC: 3.51 10*6/uL — ABNORMAL LOW (ref 3.70–5.45)
RDW: 19.3 % — ABNORMAL HIGH (ref 11.2–14.5)
WBC: 5 10*3/uL (ref 3.9–10.3)

## 2015-12-09 LAB — COMPREHENSIVE METABOLIC PANEL
ALT: 18 U/L (ref 0–55)
ANION GAP: 12 meq/L — AB (ref 3–11)
AST: 34 U/L (ref 5–34)
Albumin: 3.2 g/dL — ABNORMAL LOW (ref 3.5–5.0)
Alkaline Phosphatase: 132 U/L (ref 40–150)
BILIRUBIN TOTAL: 0.56 mg/dL (ref 0.20–1.20)
BUN: 16.4 mg/dL (ref 7.0–26.0)
CALCIUM: 9.7 mg/dL (ref 8.4–10.4)
CHLORIDE: 103 meq/L (ref 98–109)
CO2: 24 mEq/L (ref 22–29)
CREATININE: 1 mg/dL (ref 0.6–1.1)
EGFR: 57 mL/min/{1.73_m2} — ABNORMAL LOW (ref >90)
Glucose: 158 mg/dl — ABNORMAL HIGH (ref 70–140)
POTASSIUM: 3.4 meq/L — AB (ref 3.5–5.1)
Sodium: 138 mEq/L (ref 136–145)
Total Protein: 6.6 g/dL (ref 6.4–8.3)

## 2015-12-09 MED ORDER — SODIUM CHLORIDE 0.9 % IV SOLN
500.0000 mg/m2 | Freq: Once | INTRAVENOUS | Status: AC
  Administered 2015-12-09: 900 mg via INTRAVENOUS
  Filled 2015-12-09: qty 32

## 2015-12-09 MED ORDER — SODIUM CHLORIDE 0.9 % IV SOLN
408.5000 mg | Freq: Once | INTRAVENOUS | Status: AC
  Administered 2015-12-09: 410 mg via INTRAVENOUS
  Filled 2015-12-09: qty 41

## 2015-12-09 MED ORDER — SODIUM CHLORIDE 0.9 % IV SOLN
Freq: Once | INTRAVENOUS | Status: AC
  Administered 2015-12-09: 12:00:00 via INTRAVENOUS
  Filled 2015-12-09: qty 8

## 2015-12-09 MED ORDER — SODIUM CHLORIDE 0.9 % IV SOLN
Freq: Once | INTRAVENOUS | Status: AC
  Administered 2015-12-09: 12:00:00 via INTRAVENOUS

## 2015-12-09 NOTE — Progress Notes (Signed)
[facility] [phone]   [phone]  OFFICE PROGRESS NOTE  [name], MD [place] [address]  DIAGNOSIS: Stage IV (T1b, N2, M1a) non-small cell lung cancer, adenocarcinoma with negative EGFR mutation, negative ALK gene translocation, presenting with right upper lobe lung mass in addition to mediastinal lymphadenopathy and malignant pleural effusion as well as innumerable bilateral pulmonary nodules.  PRIOR THERAPY: None  CURRENT THERAPY: Systemic chemotherapy with carboplatin for AUC of 5 and Alimta 500 MG/M2 every 3 weeks. First dose on 09/16/2015. Status post 4 cycles.  INTERVAL HISTORY: Deborah [name] 69 y.o. female returns to the clinic today for follow-up visit accompanied by her husband. The patient is currently on systemic chemotherapy with carboplatin and Alimta status post 4 cycles and tolerating it fairly well. She denied having any significant fever or chills, no weight loss or night sweats. She denied having any significant nausea or vomiting. The patient denied having any significant chest pain, shortness breath, cough or hemoptysis. She is here today to start cycle #5.  MEDICAL HISTORY: Past Medical History  Diagnosis Date  . Allergy     Mold  . Hyperlipidemia 08/04/2011  . Hypothyroidism 08/04/2011  . Atrophic vaginitis   . 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 Basal cell carcinoma of skin 2010  . Osteopenia 10/2012    T score -1.7 FRAX 7%/0.3%  . Vertigo   . Non-small cell lung cancer [facility]) September 2016    Adenocarcinoma  . Shortness of breath dyspnea   . Meniere's disease   . Encounter for antineoplastic chemotherapy 10/28/2015  . Malignant neoplasm of right upper lobe of lung ([facility]) 08/26/2015    ALLERGIES:  has No Known Allergies.  MEDICATIONS:  Current Outpatient Prescriptions  Medication Sig Dispense Refill  . albuterol (PROVENTIL  HFA;VENTOLIN HFA) 108 (90 BASE) MCG/ACT inhaler Inhale 2 puffs into the lungs every 6 (six) hours as needed for wheezing or shortness of breath. 1 Inhaler 1  . Cholecalciferol (VITAMIN D-3) 1000 UNITS CAPS Take 1 capsule by mouth daily.     Marland Kitchen dexamethasone (DECADRON) 4 MG tablet 4 mg po bid the day before, day of and day after chemo 40 tablet 1  . folic acid (FOLVITE) 1 MG tablet Take 1 tablet (1 mg total) by mouth daily. 30 tablet 3  . HYDROcodone-homatropine (HYCODAN) 5-1.5 MG/5ML syrup Take 5 mLs by mouth every 6 (six) hours as needed for cough. 120 mL 0  . levothyroxine (SYNTHROID, LEVOTHROID) 75 MCG tablet TAKE 1 TABLET BY MOUTH DAILY. 90 tablet 3  . LORazepam (ATIVAN) 0.5 MG tablet Take one every eight hours as needed for anxiety.  Prefers one at bedtime. 30 tablet 0  . ondansetron (ZOFRAN) 8 MG tablet Take 1 tablet (8 mg total) by mouth every 8 (eight) hours as needed for nausea or vomiting. 20 tablet 0  . prochlorperazine (COMPAZINE) 10 MG tablet Take 1 tablet (10 mg total) by mouth every 6 (six) hours as needed for nausea or vomiting. 30 tablet 0  . triamterene-hydrochlorothiazide (DYAZIDE) 37.5-25 MG per capsule Take 1 capsule by mouth daily as needed.      No current facility-administered medications for this visit.    SURGICAL HISTORY:  Past Surgical History  Procedure Laterality Date  . Tonsillectomy  1952  . Vaginal hysterectomy  1980  . Breast biopsy  1999    RADIATION  TREATMENTS  . Breast lumpectomy Right 1998    right  . Chest tube insertion Right 09/15/2015    Procedure: INSERTION RIGHT PLEURAL DRAINAGE CATHETER;  Surgeon: [facility] Nakayama, MD;  Location: [facility];  Service: Thoracic;  Laterality: Right;    REVIEW OF SYSTEMS:  A comprehensive review of systems was negative except for: Constitutional: positive for fatigue   PHYSICAL EXAMINATION: General appearance: alert, cooperative, fatigued and no distress Head: Normocephalic, without obvious abnormality,  atraumatic Neck: no adenopathy, no JVD, supple, symmetrical, trachea midline and thyroid not enlarged, symmetric, no tenderness/mass/nodules Lymph nodes: Cervical, supraclavicular, and axillary nodes normal. Resp: diminished breath sounds RLL and dullness to percussion RLL Back: symmetric, no curvature. ROM normal. No CVA tenderness. Cardio: regular rate and rhythm, S1, S2 normal, no murmur, click, rub or gallop GI: soft, non-tender; bowel sounds normal; no masses,  no organomegaly Extremities: extremities normal, atraumatic, no cyanosis or edema Neurologic: Alert and oriented X 3, normal strength and tone. Normal symmetric reflexes. Normal coordination and gait  ECOG PERFORMANCE STATUS: 1 - Symptomatic but completely ambulatory  Blood pressure 126/75, pulse 102, temperature 98 F (36.7 C), temperature source Oral, resp. rate 18, height '5\' 9"'$  (1.753 m), weight 136 lb 12.8 oz (62.052 kg), last menstrual period 10/11/1979, SpO2 98 %.  LABORATORY DATA: Lab Results  Component Value Date   WBC 5.0 12/09/2015   HGB 10.7* 12/09/2015   HCT 31.9* 12/09/2015   MCV 90.8 12/09/2015   PLT 352 12/09/2015      Chemistry      Component Value Date/Time   NA 138 12/09/2015 1019   NA 139 09/15/2015 1210   K 3.4* 12/09/2015 1019   K 4.5 09/15/2015 1210   CL 107 09/15/2015 1210   CO2 24 12/09/2015 1019   CO2 23 09/15/2015 1210   BUN 16.4 12/09/2015 1019   BUN 13 09/15/2015 1210   CREATININE 1.0 12/09/2015 1019   CREATININE 0.86 09/15/2015 1210      Component Value Date/Time   CALCIUM 9.7 12/09/2015 1019   CALCIUM 9.5 09/15/2015 1210   ALKPHOS 132 12/09/2015 1019   ALKPHOS 95 09/15/2015 1210   AST 34 12/09/2015 1019   AST 41 09/15/2015 1210   ALT 18 12/09/2015 1019   ALT 12* 09/15/2015 1210   BILITOT 0.56 12/09/2015 1019   BILITOT 0.7 09/15/2015 1210       RADIOGRAPHIC STUDIES: Ct Chest W Contrast  11/16/2015  CLINICAL DATA:  Restaging stage IV right lung adenocarcinoma diagnosed  in September 2016 with ongoing chemotherapy. EXAM: CT CHEST, ABDOMEN, AND PELVIS WITH CONTRAST TECHNIQUE: Multidetector CT imaging of the chest, abdomen and pelvis was performed following the standard protocol during bolus administration of intravenous contrast. CONTRAST:  124m OMNIPAQUE IOHEXOL 300 MG/ML  SOLN COMPARISON:  09/01/2015 PET-CT. FINDINGS: CT CHEST FINDINGS Mediastinum/Nodes: Normal heart size. Trace pericardial fluid/ thickening, slightly decreased. Great vessels are normal in course and caliber. No central pulmonary emboli. Normal visualized thyroid. Normal esophagus. No pathologically enlarged axillary nodes. The previously described hypermetabolic 0.6 cm left axillary node (series 2/ image 16) is stable. Mildly enlarged 1.2 cm right paratracheal node (2/17) is mildly decreased from 1.5 cm. Mildly enlarged 1.0 cm subcarinal node (2/20) is decreased from 1.8 cm. Mildly enlarged 1.0 cm right hilar node (2/27) is likely slightly decreased from 1.1 cm. No pathologically enlarged left hilar nodes. No residual pathologically enlarged supraclavicular or internal mammary lymph nodes. Lungs/Pleura: No pneumothorax. Small right pleural effusion is decreased, with likely mild anterior loculation.  There is diffuse pleural thickening and hyper enhancement throughout the right pleural space, which is mildly irregular and measures up to 6 mm in thickness, not appreciably changed. No left pleural effusion. Extensive infiltrative peribronchovascular consolidation throughout the central right lung and interlobular septal thickening throughout the peripheral right lung is not appreciably changed. A representative 3.2 x 1.6 cm focus of peribronchovascular consolidation in the right upper lobe (series 5/ image 21) previously measured 3.1 x 1.6 cm on 09/01/2015, not appreciably changed. There is overall slightly improved aeration in the right lung. Re- demonstrated are innumerable lower lung zone predominant pulmonary  nodules throughout both lungs (greater than 50), not appreciably changed, for example a stable 1.0 cm left lower lobe pulmonary nodule (series 5/ image 37). Musculoskeletal: No new focal osseous lesions. Tiny nonspecific sclerotic foci in the lower manubrium and lateral right second and third ribs are stable and were not associated with FDG uptake on the prior PET-CT study. Mild degenerative changes in the thoracic spine. Surgical clips are present in the lateral right breast. CT ABDOMEN PELVIS FINDINGS Hepatobiliary: Normal liver with no liver mass. Normal gallbladder with no radiopaque cholelithiasis. No biliary ductal dilatation. Pancreas: Normal, with no mass or duct dilation. Spleen: Normal size. No mass. Adrenals/Urinary Tract: Normal adrenals. Simple 1.0 cm renal cyst in the medial lower left kidney. Separate subcentimeter hypodense renal cortical lesions in the upper right kidney and anterior lower left kidney, too small to characterize. No hydronephrosis. Normal bladder. Stomach/Bowel: Grossly normal stomach. Normal caliber small bowel with no small bowel wall thickening. Normal appendix . Normal large bowel with no diverticulosis, large bowel wall thickening or pericolonic fat stranding. Vascular/Lymphatic: Atherosclerotic nonaneurysmal abdominal aorta. Patent portal, splenic, hepatic and renal veins. No pathologically enlarged lymph nodes in the abdomen or pelvis. Reproductive: Status post hysterectomy, with no abnormal findings at the vaginal cuff. No adnexal mass. Other: No pneumoperitoneum, ascites or focal fluid collection. Musculoskeletal: No new focal osseous lesions. Mild-to-moderate degenerative changes in the lumbar spine. Stable nonspecific subcentimeter sclerotic foci in the bilateral pelvic girdle and lower lumbar spine, which were not associated with FDG uptake on the prior PET-CT study. IMPRESSION: 1. Resolved supraclavicular, internal mammary and left hilar adenopathy. Decreased  paratracheal, subcarinal and right hilar adenopathy. 2. Small malignant right pleural effusion, decreased. Trace pericardial fluid/thickening, decreased. 3. No appreciable change in extensive infiltrative peribronchovascular tumor throughout the central right lung. 4. No appreciable change in pulmonary metastatic burden. 5. No evidence of metastatic disease in the abdomen or pelvis. Electronically Signed   By: Ilona Sorrel M.D.   On: 11/16/2015 16:17   Ct Abdomen Pelvis W Contrast  11/16/2015  CLINICAL DATA:  Restaging stage IV right lung adenocarcinoma diagnosed in September 2016 with ongoing chemotherapy. EXAM: CT CHEST, ABDOMEN, AND PELVIS WITH CONTRAST TECHNIQUE: Multidetector CT imaging of the chest, abdomen and pelvis was performed following the standard protocol during bolus administration of intravenous contrast. CONTRAST:  172m OMNIPAQUE IOHEXOL 300 MG/ML  SOLN COMPARISON:  09/01/2015 PET-CT. FINDINGS: CT CHEST FINDINGS Mediastinum/Nodes: Normal heart size. Trace pericardial fluid/ thickening, slightly decreased. Great vessels are normal in course and caliber. No central pulmonary emboli. Normal visualized thyroid. Normal esophagus. No pathologically enlarged axillary nodes. The previously described hypermetabolic 0.6 cm left axillary node (series 2/ image 16) is stable. Mildly enlarged 1.2 cm right paratracheal node (2/17) is mildly decreased from 1.5 cm. Mildly enlarged 1.0 cm subcarinal node (2/20) is decreased from 1.8 cm. Mildly enlarged 1.0 cm right hilar node (2/27) is likely  slightly decreased from 1.1 cm. No pathologically enlarged left hilar nodes. No residual pathologically enlarged supraclavicular or internal mammary lymph nodes. Lungs/Pleura: No pneumothorax. Small right pleural effusion is decreased, with likely mild anterior loculation. There is diffuse pleural thickening and hyper enhancement throughout the right pleural space, which is mildly irregular and measures up to 6 mm in  thickness, not appreciably changed. No left pleural effusion. Extensive infiltrative peribronchovascular consolidation throughout the central right lung and interlobular septal thickening throughout the peripheral right lung is not appreciably changed. A representative 3.2 x 1.6 cm focus of peribronchovascular consolidation in the right upper lobe (series 5/ image 21) previously measured 3.1 x 1.6 cm on 09/01/2015, not appreciably changed. There is overall slightly improved aeration in the right lung. Re- demonstrated are innumerable lower lung zone predominant pulmonary nodules throughout both lungs (greater than 50), not appreciably changed, for example a stable 1.0 cm left lower lobe pulmonary nodule (series 5/ image 37). Musculoskeletal: No new focal osseous lesions. Tiny nonspecific sclerotic foci in the lower manubrium and lateral right second and third ribs are stable and were not associated with FDG uptake on the prior PET-CT study. Mild degenerative changes in the thoracic spine. Surgical clips are present in the lateral right breast. CT ABDOMEN PELVIS FINDINGS Hepatobiliary: Normal liver with no liver mass. Normal gallbladder with no radiopaque cholelithiasis. No biliary ductal dilatation. Pancreas: Normal, with no mass or duct dilation. Spleen: Normal size. No mass. Adrenals/Urinary Tract: Normal adrenals. Simple 1.0 cm renal cyst in the medial lower left kidney. Separate subcentimeter hypodense renal cortical lesions in the upper right kidney and anterior lower left kidney, too small to characterize. No hydronephrosis. Normal bladder. Stomach/Bowel: Grossly normal stomach. Normal caliber small bowel with no small bowel wall thickening. Normal appendix . Normal large bowel with no diverticulosis, large bowel wall thickening or pericolonic fat stranding. Vascular/Lymphatic: Atherosclerotic nonaneurysmal abdominal aorta. Patent portal, splenic, hepatic and renal veins. No pathologically enlarged lymph nodes  in the abdomen or pelvis. Reproductive: Status post hysterectomy, with no abnormal findings at the vaginal cuff. No adnexal mass. Other: No pneumoperitoneum, ascites or focal fluid collection. Musculoskeletal: No new focal osseous lesions. Mild-to-moderate degenerative changes in the lumbar spine. Stable nonspecific subcentimeter sclerotic foci in the bilateral pelvic girdle and lower lumbar spine, which were not associated with FDG uptake on the prior PET-CT study. IMPRESSION: 1. Resolved supraclavicular, internal mammary and left hilar adenopathy. Decreased paratracheal, subcarinal and right hilar adenopathy. 2. Small malignant right pleural effusion, decreased. Trace pericardial fluid/thickening, decreased. 3. No appreciable change in extensive infiltrative peribronchovascular tumor throughout the central right lung. 4. No appreciable change in pulmonary metastatic burden. 5. No evidence of metastatic disease in the abdomen or pelvis. Electronically Signed   By: Ilona Sorrel M.D.   On: 11/16/2015 16:17    ASSESSMENT AND PLAN: This is a very pleasant 69 years old white female recently diagnosed with stage IV non-small cell lung cancer, adenocarcinoma with no detectable actionable mutations. The patient was started on systemic chemotherapy with carboplatin and Alimta status post 4 cycles of treatment and tolerating her treatment fairly well.  The patient is feeling fine today with no specific complaints. I recommended for her to continue her current treatment with carboplatin and Alimta with cycle #5 today as a scheduled.  She would come back for follow-up visit in 3 weeks for reevaluation before starting cycle #6. The patient was advised to call immediately if she has any concerning symptoms in the interval. The patient voices  understanding of current disease status and treatment options and is in agreement with the current care plan.  All questions were answered. The patient knows to call the clinic  with any problems, questions or concerns. We can certainly see the patient much sooner if necessary.  Disclaimer: This note was dictated with voice recognition software. Similar sounding words can inadvertently be transcribed and may not be corrected upon review.

## 2015-12-09 NOTE — Patient Instructions (Signed)
[facility] Discharge Instructions for Patients Receiving Chemotherapy  Today you received the following chemotherapy agents ALIMTA, CARBOPLATIN To help prevent nausea and vomiting after your treatment, we encourage you to take your nausea medication as prescribed.   If you develop nausea and vomiting that is not controlled by your nausea medication, call the clinic.   BELOW ARE SYMPTOMS THAT SHOULD BE REPORTED IMMEDIATELY:  *FEVER GREATER THAN 100.5 F  *CHILLS WITH OR WITHOUT FEVER  NAUSEA AND VOMITING THAT IS NOT CONTROLLED WITH YOUR NAUSEA MEDICATION  *UNUSUAL SHORTNESS OF BREATH  *UNUSUAL BRUISING OR BLEEDING  TENDERNESS IN MOUTH AND THROAT WITH OR WITHOUT PRESENCE OF ULCERS  *URINARY PROBLEMS  *BOWEL PROBLEMS  UNUSUAL RASH Items with * indicate a potential emergency and should be followed up as soon as possible.  Feel free to call the clinic you have any questions or concerns. The clinic phone number is [phone].  Please show the [facility] at check-in to the Emergency Department and triage nurse.

## 2015-12-10 ENCOUNTER — Telehealth: Payer: Self-pay | Admitting: Internal Medicine

## 2015-12-10 ENCOUNTER — Encounter: Payer: Self-pay | Admitting: Internal Medicine

## 2015-12-10 NOTE — Progress Notes (Signed)
Called pt and left message to see if she ever received card in mail from [place] and approval letter to submit to billing for payment dos.

## 2015-12-10 NOTE — Telephone Encounter (Signed)
Appointments added per pof and patient will get a new schedule 12/27

## 2015-12-16 ENCOUNTER — Encounter: Payer: Self-pay | Admitting: *Deleted

## 2015-12-16 ENCOUNTER — Other Ambulatory Visit (HOSPITAL_BASED_OUTPATIENT_CLINIC_OR_DEPARTMENT_OTHER): Payer: Medicare Other

## 2015-12-16 ENCOUNTER — Encounter: Payer: Self-pay | Admitting: Internal Medicine

## 2015-12-16 ENCOUNTER — Other Ambulatory Visit: Payer: Self-pay | Admitting: *Deleted

## 2015-12-16 DIAGNOSIS — C3411 Malignant neoplasm of upper lobe, right bronchus or lung: Secondary | ICD-10-CM

## 2015-12-16 DIAGNOSIS — C3491 Malignant neoplasm of unspecified part of right bronchus or lung: Secondary | ICD-10-CM

## 2015-12-16 LAB — CBC WITH DIFFERENTIAL/PLATELET
BASO%: 1.4 % (ref 0.0–2.0)
BASOS ABS: 0.1 10*3/uL (ref 0.0–0.1)
EOS%: 0.4 % (ref 0.0–7.0)
Eosinophils Absolute: 0 10*3/uL (ref 0.0–0.5)
HEMATOCRIT: 29.9 % — AB (ref 34.8–46.6)
HGB: 10 g/dL — ABNORMAL LOW (ref 11.6–15.9)
LYMPH%: 41.9 % (ref 14.0–49.7)
MCH: 30.2 pg (ref 25.1–34.0)
MCHC: 33.5 g/dL (ref 31.5–36.0)
MCV: 90.2 fL (ref 79.5–101.0)
MONO#: 0.3 10*3/uL (ref 0.1–0.9)
MONO%: 6.8 % (ref 0.0–14.0)
NEUT#: 1.9 10*3/uL (ref 1.5–6.5)
NEUT%: 49.5 % (ref 38.4–76.8)
Platelets: 157 10*3/uL (ref 145–400)
RBC: 3.31 10*6/uL — AB (ref 3.70–5.45)
RDW: 17.6 % — ABNORMAL HIGH (ref 11.2–14.5)
WBC: 3.9 10*3/uL (ref 3.9–10.3)
lymph#: 1.6 10*3/uL (ref 0.9–3.3)

## 2015-12-16 LAB — COMPREHENSIVE METABOLIC PANEL
ALT: 27 U/L (ref 0–55)
AST: 47 U/L — AB (ref 5–34)
Albumin: 3.1 g/dL — ABNORMAL LOW (ref 3.5–5.0)
Alkaline Phosphatase: 119 U/L (ref 40–150)
Anion Gap: 8 mEq/L (ref 3–11)
BUN: 15.6 mg/dL (ref 7.0–26.0)
CALCIUM: 9.5 mg/dL (ref 8.4–10.4)
CHLORIDE: 102 meq/L (ref 98–109)
CO2: 27 meq/L (ref 22–29)
CREATININE: 0.9 mg/dL (ref 0.6–1.1)
EGFR: 70 mL/min/{1.73_m2} — ABNORMAL LOW (ref >90)
Glucose: 87 mg/dl (ref 70–140)
POTASSIUM: 3.8 meq/L (ref 3.5–5.1)
Sodium: 138 mEq/L (ref 136–145)
Total Bilirubin: 1 mg/dL (ref 0.20–1.20)
Total Protein: 6.3 g/dL — ABNORMAL LOW (ref 6.4–8.3)

## 2015-12-16 NOTE — Progress Notes (Signed)
Pt came in per my request. Asked patient if she received card and approval letter from [place]. She states she does not remember. Gave her number to call and get replacement and she will bring to me when she gets it to send to billing.

## 2015-12-23 ENCOUNTER — Other Ambulatory Visit (HOSPITAL_BASED_OUTPATIENT_CLINIC_OR_DEPARTMENT_OTHER): Payer: Medicare Other

## 2015-12-23 ENCOUNTER — Encounter: Payer: Medicare Other | Admitting: *Deleted

## 2015-12-23 DIAGNOSIS — C3411 Malignant neoplasm of upper lobe, right bronchus or lung: Secondary | ICD-10-CM

## 2015-12-23 DIAGNOSIS — C3491 Malignant neoplasm of unspecified part of right bronchus or lung: Secondary | ICD-10-CM

## 2015-12-23 LAB — COMPREHENSIVE METABOLIC PANEL
ALBUMIN: 3.3 g/dL — AB (ref 3.5–5.0)
ALT: 32 U/L (ref 0–55)
ANION GAP: 11 meq/L (ref 3–11)
AST: 51 U/L — ABNORMAL HIGH (ref 5–34)
Alkaline Phosphatase: 150 U/L (ref 40–150)
BILIRUBIN TOTAL: 0.6 mg/dL (ref 0.20–1.20)
BUN: 9.4 mg/dL (ref 7.0–26.0)
CALCIUM: 9.8 mg/dL (ref 8.4–10.4)
CO2: 26 mEq/L (ref 22–29)
CREATININE: 0.8 mg/dL (ref 0.6–1.1)
Chloride: 103 mEq/L (ref 98–109)
EGFR: 74 mL/min/{1.73_m2} — ABNORMAL LOW (ref >90)
Glucose: 91 mg/dl (ref 70–140)
Potassium: 3.5 mEq/L (ref 3.5–5.1)
Sodium: 139 mEq/L (ref 136–145)
TOTAL PROTEIN: 6.6 g/dL (ref 6.4–8.3)

## 2015-12-23 LAB — CBC WITH DIFFERENTIAL/PLATELET
BASO%: 0.3 % (ref 0.0–2.0)
Basophils Absolute: 0 10*3/uL (ref 0.0–0.1)
EOS%: 0.7 % (ref 0.0–7.0)
Eosinophils Absolute: 0 10*3/uL (ref 0.0–0.5)
HEMATOCRIT: 27 % — AB (ref 34.8–46.6)
HGB: 9.2 g/dL — ABNORMAL LOW (ref 11.6–15.9)
LYMPH#: 1 10*3/uL (ref 0.9–3.3)
LYMPH%: 34.2 % (ref 14.0–49.7)
MCH: 30.9 pg (ref 25.1–34.0)
MCHC: 34.1 g/dL (ref 31.5–36.0)
MCV: 90.6 fL (ref 79.5–101.0)
MONO#: 0.8 10*3/uL (ref 0.1–0.9)
MONO%: 26.1 % — ABNORMAL HIGH (ref 0.0–14.0)
NEUT%: 38.7 % (ref 38.4–76.8)
NEUTROS ABS: 1.1 10*3/uL — AB (ref 1.5–6.5)
PLATELETS: 92 10*3/uL — AB (ref 145–400)
RBC: 2.98 10*6/uL — ABNORMAL LOW (ref 3.70–5.45)
RDW: 15.7 % — ABNORMAL HIGH (ref 11.2–14.5)
WBC: 3 10*3/uL — AB (ref 3.9–10.3)

## 2015-12-23 LAB — RESEARCH LABS

## 2015-12-23 NOTE — Progress Notes (Signed)
See Consent Form encounter for details regarding today's visit. Cindy S. Brigitte Pulse BSN, RN, [place] 12/23/2015 12:02 PM

## 2015-12-30 ENCOUNTER — Encounter: Payer: Self-pay | Admitting: Internal Medicine

## 2015-12-30 ENCOUNTER — Ambulatory Visit (HOSPITAL_BASED_OUTPATIENT_CLINIC_OR_DEPARTMENT_OTHER): Payer: Medicare Other

## 2015-12-30 ENCOUNTER — Ambulatory Visit (HOSPITAL_BASED_OUTPATIENT_CLINIC_OR_DEPARTMENT_OTHER): Payer: Medicare Other | Admitting: Internal Medicine

## 2015-12-30 ENCOUNTER — Other Ambulatory Visit (HOSPITAL_BASED_OUTPATIENT_CLINIC_OR_DEPARTMENT_OTHER): Payer: Medicare Other

## 2015-12-30 VITALS — BP 134/72 | HR 102 | Temp 98.0°F | Resp 17 | Ht 69.0 in | Wt 139.9 lb

## 2015-12-30 DIAGNOSIS — Z5111 Encounter for antineoplastic chemotherapy: Secondary | ICD-10-CM

## 2015-12-30 DIAGNOSIS — J910 Malignant pleural effusion: Secondary | ICD-10-CM

## 2015-12-30 DIAGNOSIS — C3411 Malignant neoplasm of upper lobe, right bronchus or lung: Secondary | ICD-10-CM

## 2015-12-30 DIAGNOSIS — E039 Hypothyroidism, unspecified: Secondary | ICD-10-CM

## 2015-12-30 DIAGNOSIS — C3491 Malignant neoplasm of unspecified part of right bronchus or lung: Secondary | ICD-10-CM

## 2015-12-30 LAB — COMPREHENSIVE METABOLIC PANEL
ALT: 14 U/L (ref 0–55)
ANION GAP: 11 meq/L (ref 3–11)
AST: 35 U/L — AB (ref 5–34)
Albumin: 3.1 g/dL — ABNORMAL LOW (ref 3.5–5.0)
Alkaline Phosphatase: 139 U/L (ref 40–150)
BILIRUBIN TOTAL: 0.38 mg/dL (ref 0.20–1.20)
BUN: 13.2 mg/dL (ref 7.0–26.0)
CALCIUM: 9.5 mg/dL (ref 8.4–10.4)
CO2: 20 mEq/L — ABNORMAL LOW (ref 22–29)
CREATININE: 0.8 mg/dL (ref 0.6–1.1)
Chloride: 108 mEq/L (ref 98–109)
EGFR: 74 mL/min/{1.73_m2} — ABNORMAL LOW (ref >90)
Glucose: 98 mg/dl (ref 70–140)
Potassium: 4 mEq/L (ref 3.5–5.1)
Sodium: 140 mEq/L (ref 136–145)
TOTAL PROTEIN: 6.2 g/dL — AB (ref 6.4–8.3)

## 2015-12-30 LAB — CBC WITH DIFFERENTIAL/PLATELET
BASO%: 0.7 % (ref 0.0–2.0)
Basophils Absolute: 0 10*3/uL (ref 0.0–0.1)
EOS%: 0.3 % (ref 0.0–7.0)
Eosinophils Absolute: 0 10*3/uL (ref 0.0–0.5)
HEMATOCRIT: 31.9 % — AB (ref 34.8–46.6)
HEMOGLOBIN: 10.5 g/dL — AB (ref 11.6–15.9)
LYMPH#: 1.4 10*3/uL (ref 0.9–3.3)
LYMPH%: 23.3 % (ref 14.0–49.7)
MCH: 30.9 pg (ref 25.1–34.0)
MCHC: 32.9 g/dL (ref 31.5–36.0)
MCV: 93.8 fL (ref 79.5–101.0)
MONO#: 1.5 10*3/uL — AB (ref 0.1–0.9)
MONO%: 24.5 % — ABNORMAL HIGH (ref 0.0–14.0)
NEUT%: 51.2 % (ref 38.4–76.8)
NEUTROS ABS: 3 10*3/uL (ref 1.5–6.5)
PLATELETS: 309 10*3/uL (ref 145–400)
RBC: 3.4 10*6/uL — ABNORMAL LOW (ref 3.70–5.45)
RDW: 17 % — AB (ref 11.2–14.5)
WBC: 5.9 10*3/uL (ref 3.9–10.3)

## 2015-12-30 MED ORDER — SODIUM CHLORIDE 0.9 % IV SOLN
Freq: Once | INTRAVENOUS | Status: AC
  Administered 2015-12-30: 11:00:00 via INTRAVENOUS

## 2015-12-30 MED ORDER — CYANOCOBALAMIN 1000 MCG/ML IJ SOLN
1000.0000 ug | Freq: Once | INTRAMUSCULAR | Status: AC
  Administered 2015-12-30: 1000 ug via INTRAMUSCULAR

## 2015-12-30 MED ORDER — SODIUM CHLORIDE 0.9 % IJ SOLN
10.0000 mL | INTRAMUSCULAR | Status: DC | PRN
  Filled 2015-12-30: qty 10

## 2015-12-30 MED ORDER — DEXAMETHASONE SODIUM PHOSPHATE 100 MG/10ML IJ SOLN
Freq: Once | INTRAMUSCULAR | Status: AC
  Administered 2015-12-30: 12:00:00 via INTRAVENOUS
  Filled 2015-12-30: qty 8

## 2015-12-30 MED ORDER — PEMETREXED DISODIUM CHEMO INJECTION 500 MG
500.0000 mg/m2 | Freq: Once | INTRAVENOUS | Status: AC
  Administered 2015-12-30: 900 mg via INTRAVENOUS
  Filled 2015-12-30: qty 36

## 2015-12-30 MED ORDER — CYANOCOBALAMIN 1000 MCG/ML IJ SOLN
INTRAMUSCULAR | Status: AC
  Filled 2015-12-30: qty 1

## 2015-12-30 MED ORDER — SODIUM CHLORIDE 0.9 % IV SOLN
408.5000 mg | Freq: Once | INTRAVENOUS | Status: AC
  Administered 2015-12-30: 410 mg via INTRAVENOUS
  Filled 2015-12-30: qty 41

## 2015-12-30 MED ORDER — HEPARIN SOD (PORK) LOCK FLUSH 100 UNIT/ML IV SOLN
500.0000 [IU] | Freq: Once | INTRAVENOUS | Status: DC | PRN
  Filled 2015-12-30: qty 5

## 2015-12-30 NOTE — Patient Instructions (Signed)
[facility] Discharge Instructions for Patients Receiving Chemotherapy  Today you received the following chemotherapy agents: Alimta and Carboplatin.  To help prevent nausea and vomiting after your treatment, we encourage you to take your nausea medication as directed.   If you develop nausea and vomiting that is not controlled by your nausea medication, call the clinic.   BELOW ARE SYMPTOMS THAT SHOULD BE REPORTED IMMEDIATELY:  *FEVER GREATER THAN 100.5 F  *CHILLS WITH OR WITHOUT FEVER  NAUSEA AND VOMITING THAT IS NOT CONTROLLED WITH YOUR NAUSEA MEDICATION  *UNUSUAL SHORTNESS OF BREATH  *UNUSUAL BRUISING OR BLEEDING  TENDERNESS IN MOUTH AND THROAT WITH OR WITHOUT PRESENCE OF ULCERS  *URINARY PROBLEMS  *BOWEL PROBLEMS  UNUSUAL RASH Items with * indicate a potential emergency and should be followed up as soon as possible.  Feel free to call the clinic you have any questions or concerns. The clinic phone number is [phone].  Please show the CHEMO ALERT CARD at check-in to the Emergency Department and triage nurse.   

## 2015-12-30 NOTE — Progress Notes (Signed)
Patient came in to bring her approval letter and card from [place] assistance. Emailed to [place] in billing to submit DOS 10/07/15 and 10/28/15. Patient has her last chemo today so will need to reapply for 2017. Patient will call me with income information for me to complete re-enrollment online.

## 2015-12-30 NOTE — Progress Notes (Signed)
[facility] [phone]   [phone]  OFFICE PROGRESS NOTE  [name], MD [place] [address]  DIAGNOSIS: Stage IV (T1b, N2, M1a) non-small cell lung cancer, adenocarcinoma with negative EGFR mutation, negative ALK gene translocation, presenting with right upper lobe lung mass in addition to mediastinal lymphadenopathy and malignant pleural effusion as well as innumerable bilateral pulmonary nodules.  PRIOR THERAPY: None  CURRENT THERAPY: Systemic chemotherapy with carboplatin for AUC of 5 and Alimta 500 MG/M2 every 3 weeks. First dose on 09/16/2015. Status post 5 cycles.  INTERVAL HISTORY: Deborah [name] 70 y.o. female returns to the clinic today for follow-up visit accompanied by her husband. The patient is currently on systemic chemotherapy with carboplatin and Alimta status post 5 cycles and tolerating it fairly well. She has some ringing in her ear that has been going on for a while even before starting chemotherapy and she is followed by Dr. Lucia Gaskins. It improved after she took Dyazide. She also has few episodes of diarrhea after long time of constipation and laxatives treatment. She denied having any significant fever or chills, no weight loss or night sweats. She denied having any significant nausea or vomiting. The patient denied having any significant chest pain, shortness of breath, cough or hemoptysis. She is here today to start cycle #6.  MEDICAL HISTORY: Past Medical History  Diagnosis Date  . Allergy     Mold  . Hyperlipidemia 08/04/2011  . Hypothyroidism 08/04/2011  . Atrophic vaginitis   . Cancer ([place]) 1999    STATUS POST RIGHT LUMPECTOMY WITH RADIATION FOR DUCTAL CA IN SITU  . Basal cell carcinoma of skin 2010  . Osteopenia 10/2012    T score -1.7 FRAX 7%/0.3%  . Vertigo   . Non-small cell lung cancer [facility]) September 2016    Adenocarcinoma  . Shortness of breath dyspnea   . Meniere's disease   .  Encounter for antineoplastic chemotherapy 10/28/2015  . Malignant neoplasm of right upper lobe of lung ([place]) 08/26/2015    ALLERGIES:  has No Known Allergies.  MEDICATIONS:  Current Outpatient Prescriptions  Medication Sig Dispense Refill  . albuterol (PROVENTIL HFA;VENTOLIN HFA) 108 (90 BASE) MCG/ACT inhaler Inhale 2 puffs into the lungs every 6 (six) hours as needed for wheezing or shortness of breath. 1 Inhaler 1  . Cholecalciferol (VITAMIN D-3) 1000 UNITS CAPS Take 1 capsule by mouth daily.     Marland Kitchen dexamethasone (DECADRON) 4 MG tablet 4 mg po bid the day before, day of and day after chemo 40 tablet 1  . folic acid (FOLVITE) 1 MG tablet Take 1 tablet (1 mg total) by mouth daily. 30 tablet 3  . HYDROcodone-homatropine (HYCODAN) 5-1.5 MG/5ML syrup Take 5 mLs by mouth every 6 (six) hours as needed for cough. 120 mL 0  . levothyroxine (SYNTHROID, LEVOTHROID) 75 MCG tablet TAKE 1 TABLET BY MOUTH DAILY. 90 tablet 3  . LORazepam (ATIVAN) 0.5 MG tablet Take one every eight hours as needed for anxiety.  Prefers one at bedtime. 30 tablet 0  . ondansetron (ZOFRAN) 8 MG tablet Take 1 tablet (8 mg total) by mouth every 8 (eight) hours as needed for nausea or vomiting. 20 tablet 0  . prochlorperazine (COMPAZINE) 10 MG tablet Take 1 tablet (10 mg total) by mouth every 6 (six) hours as needed for nausea or vomiting. 30 tablet 0  . triamterene-hydrochlorothiazide (DYAZIDE) 37.5-25 MG per capsule Take 1 capsule by mouth daily  as needed.      No current facility-administered medications for this visit.    SURGICAL HISTORY:  Past Surgical History  Procedure Laterality Date  . Tonsillectomy  1952  . Vaginal hysterectomy  1980  . Breast biopsy  1999    RADIATION TREATMENTS  . Breast lumpectomy Right 1998    right  . Chest tube insertion Right 09/15/2015    Procedure: INSERTION RIGHT PLEURAL DRAINAGE CATHETER;  Surgeon: Melrose Nakayama, MD;  Location: [facility];  Service: Thoracic;  Laterality: Right;     REVIEW OF SYSTEMS:  A comprehensive review of systems was negative except for: Constitutional: positive for fatigue Gastrointestinal: positive for diarrhea   PHYSICAL EXAMINATION: General appearance: alert, cooperative, fatigued and no distress Head: Normocephalic, without obvious abnormality, atraumatic Neck: no adenopathy, no JVD, supple, symmetrical, trachea midline and thyroid not enlarged, symmetric, no tenderness/mass/nodules Lymph nodes: Cervical, supraclavicular, and axillary nodes normal. Resp: diminished breath sounds RLL and dullness to percussion RLL Back: symmetric, no curvature. ROM normal. No CVA tenderness. Cardio: regular rate and rhythm, S1, S2 normal, no murmur, click, rub or gallop GI: soft, non-tender; bowel sounds normal; no masses,  no organomegaly Extremities: extremities normal, atraumatic, no cyanosis or edema Neurologic: Alert and oriented X 3, normal strength and tone. Normal symmetric reflexes. Normal coordination and gait  ECOG PERFORMANCE STATUS: 1 - Symptomatic but completely ambulatory  Blood pressure 134/72, pulse 102, temperature 98 F (36.7 C), temperature source Oral, resp. rate 17, height 5' 9" (1.753 m), weight 139 lb 14.4 oz (63.458 kg), last menstrual period 10/11/1979, SpO2 99 %.  LABORATORY DATA: Lab Results  Component Value Date   WBC 5.9 12/30/2015   HGB 10.5* 12/30/2015   HCT 31.9* 12/30/2015   MCV 93.8 12/30/2015   PLT 309 12/30/2015      Chemistry      Component Value Date/Time   NA 140 12/30/2015 1012   NA 139 09/15/2015 1210   K 4.0 12/30/2015 1012   K 4.5 09/15/2015 1210   CL 107 09/15/2015 1210   CO2 20* 12/30/2015 1012   CO2 23 09/15/2015 1210   BUN 13.2 12/30/2015 1012   BUN 13 09/15/2015 1210   CREATININE 0.8 12/30/2015 1012   CREATININE 0.86 09/15/2015 1210      Component Value Date/Time   CALCIUM 9.5 12/30/2015 1012   CALCIUM 9.5 09/15/2015 1210   ALKPHOS 139 12/30/2015 1012   ALKPHOS 95 09/15/2015 1210    AST 35* 12/30/2015 1012   AST 41 09/15/2015 1210   ALT 14 12/30/2015 1012   ALT 12* 09/15/2015 1210   BILITOT 0.38 12/30/2015 1012   BILITOT 0.7 09/15/2015 1210       RADIOGRAPHIC STUDIES: No results found.  ASSESSMENT AND PLAN: This is a very pleasant 70 years old white female recently diagnosed with stage IV non-small cell lung cancer, adenocarcinoma with no detectable actionable mutations. The patient was started on systemic chemotherapy with carboplatin and Alimta status post 5 cycles of treatment and tolerating her treatment fairly well.  The patient is feeling fine today with no concerning complaints. I recommended for her to continue her current treatment with carboplatin and Alimta with cycle #6 today as a scheduled.  I will see her back for follow-up visit in one month's for reevaluation after repeating CT scan of the chest, abdomen and pelvis for restaging of her disease. The patient was advised to call immediately if she has any concerning symptoms in the interval. The patient voices understanding of  current disease status and treatment options and is in agreement with the current care plan.  All questions were answered. The patient knows to call the clinic with any problems, questions or concerns. We can certainly see the patient much sooner if necessary.  Disclaimer: This note was dictated with voice recognition software. Similar sounding words can inadvertently be transcribed and may not be corrected upon review.       

## 2015-12-31 ENCOUNTER — Other Ambulatory Visit: Payer: Self-pay | Admitting: Internal Medicine

## 2016-01-04 ENCOUNTER — Other Ambulatory Visit: Payer: Self-pay | Admitting: Medical Oncology

## 2016-01-04 DIAGNOSIS — R110 Nausea: Secondary | ICD-10-CM

## 2016-01-04 DIAGNOSIS — C3491 Malignant neoplasm of unspecified part of right bronchus or lung: Secondary | ICD-10-CM

## 2016-01-04 DIAGNOSIS — T451X5A Adverse effect of antineoplastic and immunosuppressive drugs, initial encounter: Secondary | ICD-10-CM

## 2016-01-04 MED ORDER — LORAZEPAM 0.5 MG PO TABS: ORAL_TABLET | ORAL | Status: DC

## 2016-01-04 NOTE — Progress Notes (Signed)
Called in ativan to pharmacy

## 2016-01-06 ENCOUNTER — Other Ambulatory Visit (HOSPITAL_BASED_OUTPATIENT_CLINIC_OR_DEPARTMENT_OTHER): Payer: Medicare Other

## 2016-01-06 DIAGNOSIS — C3411 Malignant neoplasm of upper lobe, right bronchus or lung: Secondary | ICD-10-CM | POA: Diagnosis not present

## 2016-01-06 DIAGNOSIS — C3491 Malignant neoplasm of unspecified part of right bronchus or lung: Secondary | ICD-10-CM

## 2016-01-06 LAB — COMPREHENSIVE METABOLIC PANEL
ALBUMIN: 3.3 g/dL — AB (ref 3.5–5.0)
ALK PHOS: 122 U/L (ref 40–150)
ALT: 27 U/L (ref 0–55)
AST: 44 U/L — AB (ref 5–34)
Anion Gap: 8 mEq/L (ref 3–11)
BILIRUBIN TOTAL: 0.94 mg/dL (ref 0.20–1.20)
BUN: 18.5 mg/dL (ref 7.0–26.0)
CALCIUM: 9.4 mg/dL (ref 8.4–10.4)
CO2: 25 mEq/L (ref 22–29)
CREATININE: 0.8 mg/dL (ref 0.6–1.1)
Chloride: 102 mEq/L (ref 98–109)
EGFR: 75 mL/min/{1.73_m2} — ABNORMAL LOW (ref >90)
Glucose: 84 mg/dl (ref 70–140)
POTASSIUM: 4 meq/L (ref 3.5–5.1)
Sodium: 136 mEq/L (ref 136–145)
TOTAL PROTEIN: 6.4 g/dL (ref 6.4–8.3)

## 2016-01-06 LAB — CBC WITH DIFFERENTIAL/PLATELET
BASO%: 0.4 % (ref 0.0–2.0)
Basophils Absolute: 0 10*3/uL (ref 0.0–0.1)
EOS%: 0.6 % (ref 0.0–7.0)
Eosinophils Absolute: 0 10*3/uL (ref 0.0–0.5)
HEMATOCRIT: 31.9 % — AB (ref 34.8–46.6)
HEMOGLOBIN: 10.6 g/dL — AB (ref 11.6–15.9)
LYMPH%: 51.7 % — ABNORMAL HIGH (ref 14.0–49.7)
MCH: 30.6 pg (ref 25.1–34.0)
MCHC: 33.2 g/dL (ref 31.5–36.0)
MCV: 92.2 fL (ref 79.5–101.0)
MONO#: 0.3 10*3/uL (ref 0.1–0.9)
MONO%: 5.4 % (ref 0.0–14.0)
NEUT%: 41.9 % (ref 38.4–76.8)
NEUTROS ABS: 1.9 10*3/uL (ref 1.5–6.5)
Platelets: 155 10*3/uL (ref 145–400)
RBC: 3.46 10*6/uL — ABNORMAL LOW (ref 3.70–5.45)
RDW: 15.6 % — ABNORMAL HIGH (ref 11.2–14.5)
WBC: 4.6 10*3/uL (ref 3.9–10.3)
lymph#: 2.4 10*3/uL (ref 0.9–3.3)

## 2016-01-13 ENCOUNTER — Other Ambulatory Visit (HOSPITAL_BASED_OUTPATIENT_CLINIC_OR_DEPARTMENT_OTHER): Payer: Medicare Other

## 2016-01-13 DIAGNOSIS — C3491 Malignant neoplasm of unspecified part of right bronchus or lung: Secondary | ICD-10-CM

## 2016-01-13 DIAGNOSIS — C3411 Malignant neoplasm of upper lobe, right bronchus or lung: Secondary | ICD-10-CM | POA: Diagnosis not present

## 2016-01-13 LAB — CBC WITH DIFFERENTIAL/PLATELET
BASO%: 0.4 % (ref 0.0–2.0)
BASOS ABS: 0 10*3/uL (ref 0.0–0.1)
EOS ABS: 0 10*3/uL (ref 0.0–0.5)
EOS%: 1 % (ref 0.0–7.0)
HCT: 26.8 % — ABNORMAL LOW (ref 34.8–46.6)
HEMOGLOBIN: 9 g/dL — AB (ref 11.6–15.9)
LYMPH%: 24.2 % (ref 14.0–49.7)
MCH: 30.7 pg (ref 25.1–34.0)
MCHC: 33.6 g/dL (ref 31.5–36.0)
MCV: 91.4 fL (ref 79.5–101.0)
MONO#: 0.6 10*3/uL (ref 0.1–0.9)
MONO%: 18.8 % — AB (ref 0.0–14.0)
NEUT#: 1.7 10*3/uL (ref 1.5–6.5)
NEUT%: 55.6 % (ref 38.4–76.8)
Platelets: 71 10*3/uL — ABNORMAL LOW (ref 145–400)
RBC: 2.93 10*6/uL — ABNORMAL LOW (ref 3.70–5.45)
RDW: 16.5 % — AB (ref 11.2–14.5)
WBC: 3 10*3/uL — ABNORMAL LOW (ref 3.9–10.3)
lymph#: 0.7 10*3/uL — ABNORMAL LOW (ref 0.9–3.3)

## 2016-01-13 LAB — COMPREHENSIVE METABOLIC PANEL
ALBUMIN: 3.1 g/dL — AB (ref 3.5–5.0)
ALK PHOS: 128 U/L (ref 40–150)
ALT: 28 U/L (ref 0–55)
AST: 49 U/L — AB (ref 5–34)
Anion Gap: 9 mEq/L (ref 3–11)
BUN: 9.6 mg/dL (ref 7.0–26.0)
CALCIUM: 9.2 mg/dL (ref 8.4–10.4)
CHLORIDE: 105 meq/L (ref 98–109)
CO2: 25 mEq/L (ref 22–29)
Creatinine: 0.8 mg/dL (ref 0.6–1.1)
EGFR: 76 mL/min/{1.73_m2} — AB (ref >90)
GLUCOSE: 120 mg/dL (ref 70–140)
POTASSIUM: 3.6 meq/L (ref 3.5–5.1)
SODIUM: 139 meq/L (ref 136–145)
Total Bilirubin: 0.47 mg/dL (ref 0.20–1.20)
Total Protein: 6.1 g/dL — ABNORMAL LOW (ref 6.4–8.3)

## 2016-01-19 ENCOUNTER — Ambulatory Visit (HOSPITAL_COMMUNITY)
Admission: RE | Admit: 2016-01-19 | Discharge: 2016-01-19 | Disposition: A | Discharge status: Home / Self Care | Payer: Medicare Other | Source: Ambulatory Visit | Attending: Internal Medicine | Admitting: Internal Medicine

## 2016-01-19 ENCOUNTER — Encounter (HOSPITAL_COMMUNITY): Payer: Self-pay

## 2016-01-19 DIAGNOSIS — Z853 Personal history of malignant neoplasm of breast: Secondary | ICD-10-CM | POA: Diagnosis not present

## 2016-01-19 DIAGNOSIS — Z90710 Acquired absence of both cervix and uterus: Secondary | ICD-10-CM | POA: Insufficient documentation

## 2016-01-19 DIAGNOSIS — C3411 Malignant neoplasm of upper lobe, right bronchus or lung: Secondary | ICD-10-CM | POA: Diagnosis not present

## 2016-01-19 DIAGNOSIS — Z9221 Personal history of antineoplastic chemotherapy: Secondary | ICD-10-CM | POA: Diagnosis not present

## 2016-01-19 DIAGNOSIS — R590 Localized enlarged lymph nodes: Secondary | ICD-10-CM | POA: Diagnosis not present

## 2016-01-19 DIAGNOSIS — Z5111 Encounter for antineoplastic chemotherapy: Secondary | ICD-10-CM

## 2016-01-19 DIAGNOSIS — J90 Pleural effusion, not elsewhere classified: Secondary | ICD-10-CM | POA: Diagnosis not present

## 2016-01-19 DIAGNOSIS — C3491 Malignant neoplasm of unspecified part of right bronchus or lung: Secondary | ICD-10-CM | POA: Diagnosis not present

## 2016-01-19 MED ORDER — IOHEXOL 300 MG/ML  SOLN
100.0000 mL | Freq: Once | INTRAMUSCULAR | Status: AC | PRN
  Administered 2016-01-19: 100 mL via INTRAVENOUS

## 2016-01-20 ENCOUNTER — Telehealth: Payer: Self-pay | Admitting: Internal Medicine

## 2016-01-20 ENCOUNTER — Ambulatory Visit: Payer: Medicare Other

## 2016-01-20 ENCOUNTER — Telehealth: Payer: Self-pay | Admitting: *Deleted

## 2016-01-20 ENCOUNTER — Ambulatory Visit (HOSPITAL_BASED_OUTPATIENT_CLINIC_OR_DEPARTMENT_OTHER): Payer: Medicare Other | Admitting: Internal Medicine

## 2016-01-20 ENCOUNTER — Other Ambulatory Visit (HOSPITAL_BASED_OUTPATIENT_CLINIC_OR_DEPARTMENT_OTHER): Payer: Medicare Other

## 2016-01-20 ENCOUNTER — Encounter: Payer: Self-pay | Admitting: Internal Medicine

## 2016-01-20 VITALS — BP 118/72 | HR 101 | Temp 97.6°F | Resp 18 | Ht 69.0 in | Wt 138.5 lb

## 2016-01-20 DIAGNOSIS — Z5111 Encounter for antineoplastic chemotherapy: Secondary | ICD-10-CM

## 2016-01-20 DIAGNOSIS — T451X5A Adverse effect of antineoplastic and immunosuppressive drugs, initial encounter: Secondary | ICD-10-CM

## 2016-01-20 DIAGNOSIS — R110 Nausea: Secondary | ICD-10-CM

## 2016-01-20 DIAGNOSIS — C3411 Malignant neoplasm of upper lobe, right bronchus or lung: Secondary | ICD-10-CM

## 2016-01-20 LAB — COMPREHENSIVE METABOLIC PANEL
ALT: 16 U/L (ref 0–55)
AST: 40 U/L — ABNORMAL HIGH (ref 5–34)
Albumin: 2.9 g/dL — ABNORMAL LOW (ref 3.5–5.0)
Alkaline Phosphatase: 131 U/L (ref 40–150)
Anion Gap: 11 mEq/L (ref 3–11)
BILIRUBIN TOTAL: 0.32 mg/dL (ref 0.20–1.20)
BUN: 8.4 mg/dL (ref 7.0–26.0)
CHLORIDE: 108 meq/L (ref 98–109)
CO2: 22 meq/L (ref 22–29)
Calcium: 9.2 mg/dL (ref 8.4–10.4)
Creatinine: 0.8 mg/dL (ref 0.6–1.1)
EGFR: 72 mL/min/{1.73_m2} — AB (ref >90)
GLUCOSE: 85 mg/dL (ref 70–140)
Potassium: 4 mEq/L (ref 3.5–5.1)
SODIUM: 141 meq/L (ref 136–145)
TOTAL PROTEIN: 6 g/dL — AB (ref 6.4–8.3)

## 2016-01-20 LAB — CBC WITH DIFFERENTIAL/PLATELET
BASO%: 1.8 % (ref 0.0–2.0)
BASOS ABS: 0.1 10*3/uL (ref 0.0–0.1)
EOS ABS: 0.1 10*3/uL (ref 0.0–0.5)
EOS%: 1.8 % (ref 0.0–7.0)
HEMATOCRIT: 30.6 % — AB (ref 34.8–46.6)
HEMOGLOBIN: 9.9 g/dL — AB (ref 11.6–15.9)
LYMPH#: 1.4 10*3/uL (ref 0.9–3.3)
LYMPH%: 41.6 % (ref 14.0–49.7)
MCH: 31.1 pg (ref 25.1–34.0)
MCHC: 32.4 g/dL (ref 31.5–36.0)
MCV: 96.2 fL (ref 79.5–101.0)
MONO#: 0.8 10*3/uL (ref 0.1–0.9)
MONO%: 25.4 % — ABNORMAL HIGH (ref 0.0–14.0)
NEUT#: 1 10*3/uL — ABNORMAL LOW (ref 1.5–6.5)
NEUT%: 29.4 % — ABNORMAL LOW (ref 38.4–76.8)
NRBC: 0 % (ref 0–0)
PLATELETS: 285 10*3/uL (ref 145–400)
RBC: 3.18 10*6/uL — ABNORMAL LOW (ref 3.70–5.45)
RDW: 16.7 % — AB (ref 11.2–14.5)
WBC: 3.3 10*3/uL — ABNORMAL LOW (ref 3.9–10.3)

## 2016-01-20 NOTE — Telephone Encounter (Signed)
per pof to sch pt appt-sent MW email to sch trmt-pt states will look on MY CHART to review updated avs

## 2016-01-20 NOTE — Telephone Encounter (Signed)
Per staff message and POF I have scheduled appts. Advised scheduler of appts and move labs. JMW  

## 2016-01-20 NOTE — Progress Notes (Signed)
[name] [phone]   Fax:([phone]  OFFICE PROGRESS NOTE  [name], MD [place] [address]  DIAGNOSIS: Stage IV (T1b, N2, M1a) non-small cell lung cancer, adenocarcinoma with negative EGFR mutation, negative ALK gene translocation, presenting with right upper lobe lung mass in addition to mediastinal lymphadenopathy and malignant pleural effusion as well as innumerable bilateral pulmonary nodules.  PRIOR THERAPY: Systemic chemotherapy with carboplatin for AUC of 5 and Alimta 500 MG/M2 every 3 weeks. First dose on 09/16/2015. Status post 6 cycles. Last dose was given 12/30/2015 with stable disease.  CURRENT THERAPY: Maintenance systemic chemotherapy with single agent Alimta 500 MG/M2 every 3 weeks. First dose 02/03/2016.  INTERVAL HISTORY: Deborah [name] 70 y.o. female returns to the clinic today for follow-up visit accompanied by her husband. The patient completed induction chemotherapy with carboplatin and Alimta status post 6 cycles. She tolerated the last cycle of her treatment well except for fatigue. She also had mild nausea a few days after the chemotherapy but this completely resolved. She denied having any fever or chills. She has no current nausea or vomiting. She denied having any significant chest pain, shortness breath, cough or hemoptysis. She has no significant weight loss or night sweats. She had repeat CT scan of the chest, abdomen and pelvis performed recently and she is here for evaluation and discussion of her scan results.  MEDICAL HISTORY: Past Medical History  Diagnosis Date  . Allergy     Mold  . Hyperlipidemia 08/04/2011  . Hypothyroidism 08/04/2011  . Atrophic vaginitis   . Osteopenia 10/2012    T score -1.7 FRAX 7%/0.3%  . Vertigo   . Shortness of breath dyspnea   . Meniere's disease   . Encounter for antineoplastic chemotherapy 10/28/2015  . Cancer ([place]) 1999    STATUS POST RIGHT  LUMPECTOMY WITH RADIATION FOR DUCTAL CA IN SITU  . Basal cell carcinoma of skin 2010  . Non-small cell lung cancer [facility]) September 2016    Adenocarcinoma  . Malignant neoplasm of right upper lobe of lung ([place]) 08/26/2015    ALLERGIES:  has No Known Allergies.  MEDICATIONS:  Current Outpatient Prescriptions  Medication Sig Dispense Refill  . albuterol (PROVENTIL HFA;VENTOLIN HFA) 108 (90 BASE) MCG/ACT inhaler Inhale 2 puffs into the lungs every 6 (six) hours as needed for wheezing or shortness of breath. (Patient not taking: Reported on 12/30/2015) 1 Inhaler 1  . Cholecalciferol (VITAMIN D-3) 1000 UNITS CAPS Take 1 capsule by mouth daily.     Marland Kitchen dexamethasone (DECADRON) 4 MG tablet 4 mg po bid the day before, day of and day after chemo 40 tablet 1  . folic acid (FOLVITE) 1 MG tablet Take 1 tablet (1 mg total) by mouth daily. 30 tablet 3  . HYDROcodone-homatropine (HYCODAN) 5-1.5 MG/5ML syrup Take 5 mLs by mouth every 6 (six) hours as needed for cough. (Patient not taking: Reported on 12/30/2015) 120 mL 0  . levothyroxine (SYNTHROID, LEVOTHROID) 75 MCG tablet TAKE 1 TABLET BY MOUTH DAILY. 90 tablet 3  . LORazepam (ATIVAN) 0.5 MG tablet Take one every eight hours as needed for anxiety.  Prefers one at bedtime. 30 tablet 0  . ondansetron (ZOFRAN) 8 MG tablet Take 1 tablet (8 mg total) by mouth every 8 (eight) hours as needed for nausea or vomiting. 20 tablet 0  . ondansetron (ZOFRAN) 8 MG tablet TAKE 1 TABLET BY MOUTH EVERY 8 HOURS AS NEEDED FOR NAUSEA  OR VOMITING 20 tablet 0  . prochlorperazine (COMPAZINE) 10 MG tablet Take 1 tablet (10 mg total) by mouth every 6 (six) hours as needed for nausea or vomiting. (Patient not taking: Reported on 12/30/2015) 30 tablet 0  . triamterene-hydrochlorothiazide (DYAZIDE) 37.5-25 MG per capsule Take 1 capsule by mouth daily as needed.      No current facility-administered medications for this visit.    SURGICAL HISTORY:  Past Surgical History  Procedure  Laterality Date  . Tonsillectomy  1952  . Vaginal hysterectomy  1980  . Breast biopsy  1999    RADIATION TREATMENTS  . Breast lumpectomy Right 1998    right  . Chest tube insertion Right 09/15/2015    Procedure: INSERTION RIGHT PLEURAL DRAINAGE CATHETER;  Surgeon: Melrose Nakayama, MD;  Location: [facility];  Service: Thoracic;  Laterality: Right;    REVIEW OF SYSTEMS:  Constitutional: positive for fatigue Eyes: negative Ears, nose, mouth, throat, and face: negative Respiratory: negative Cardiovascular: negative Gastrointestinal: negative Genitourinary:negative Integument/breast: negative Hematologic/lymphatic: negative Musculoskeletal:negative Neurological: negative Behavioral/Psych: negative Endocrine: negative Allergic/Immunologic: negative   PHYSICAL EXAMINATION: General appearance: alert, cooperative, fatigued and no distress Head: Normocephalic, without obvious abnormality, atraumatic Neck: no adenopathy, no JVD, supple, symmetrical, trachea midline and thyroid not enlarged, symmetric, no tenderness/mass/nodules Lymph nodes: Cervical, supraclavicular, and axillary nodes normal. Resp: diminished breath sounds RLL and dullness to percussion RLL Back: symmetric, no curvature. ROM normal. No CVA tenderness. Cardio: regular rate and rhythm, S1, S2 normal, no murmur, click, rub or gallop GI: soft, non-tender; bowel sounds normal; no masses,  no organomegaly Extremities: extremities normal, atraumatic, no cyanosis or edema Neurologic: Alert and oriented X 3, normal strength and tone. Normal symmetric reflexes. Normal coordination and gait  ECOG PERFORMANCE STATUS: 1 - Symptomatic but completely ambulatory  Last menstrual period 10/11/1979.  LABORATORY DATA: Lab Results  Component Value Date   WBC 3.3* 01/20/2016   HGB 9.9* 01/20/2016   HCT 30.6* 01/20/2016   MCV 96.2 01/20/2016   PLT 285 01/20/2016      Chemistry      Component Value Date/Time   NA 139 01/13/2016  1047   NA 139 09/15/2015 1210   K 3.6 01/13/2016 1047   K 4.5 09/15/2015 1210   CL 107 09/15/2015 1210   CO2 25 01/13/2016 1047   CO2 23 09/15/2015 1210   BUN 9.6 01/13/2016 1047   BUN 13 09/15/2015 1210   CREATININE 0.8 01/13/2016 1047   CREATININE 0.86 09/15/2015 1210      Component Value Date/Time   CALCIUM 9.2 01/13/2016 1047   CALCIUM 9.5 09/15/2015 1210   ALKPHOS 128 01/13/2016 1047   ALKPHOS 95 09/15/2015 1210   AST 49* 01/13/2016 1047   AST 41 09/15/2015 1210   ALT 28 01/13/2016 1047   ALT 12* 09/15/2015 1210   BILITOT 0.47 01/13/2016 1047   BILITOT 0.7 09/15/2015 1210       RADIOGRAPHIC STUDIES: Ct Chest W Contrast  01/19/2016  CLINICAL DATA:  Restaging of right-sided lung cancer diagnosed 9/16 with chemotherapy finishing 3 weeks ago. Breast cancer 18 years ago with right lumpectomy and radiation therapy. Restaging. EXAM: CT CHEST, ABDOMEN, AND PELVIS WITH CONTRAST TECHNIQUE: Multidetector CT imaging of the chest, abdomen and pelvis was performed following the standard protocol during bolus administration of intravenous contrast. CONTRAST:  133m OMNIPAQUE IOHEXOL 300 MG/ML  SOLN COMPARISON:  11/16/2015 FINDINGS: CT CHEST Mediastinum/Nodes: Low right jugular node is similar at 6 mm on image 8/series 2. Pretracheal node measures 8  mm on image 19/series 2 versus 12 mm on the prior. Right hilar node is similar at 1.0 cm. Aortic and branch vessel atherosclerosis. Mild cardiomegaly with no pericardial effusion. No central pulmonary embolism, on this non-dedicated study. Lungs/Pleura: Similar small right pleural effusion with right-sided pleural thickening and hyper attenuation or hyper enhancement. Parietal pleural thickening measures 7 mm today on image 57/series 2. Similar to 6 mm on the prior. Trace left pleural fluid is new. Patent endobronchial tree. Redemonstration of areas of peribronchovascular predominant interstitial thickening and masslike consolidation throughout the right  lung. Similar, but difficult to measure secondary to ill-defined nature. The index component within the central right upper lobe measures 2.7 x 1.7 cm on image 23/series 4. Compare 3.2 x 1.6 cm on the prior exam. Innumerable subpleural predominant pulmonary nodules are again identified. Some are minimally enlarged. Example medial left lower lobe at 7 mm on image 40/series 4, 5 mm the same level on the prior. index left lower lobe nodule on the prior exam measures 11 mm on image 35 series 4 today versus 10 mm on the prior. A subpleural superior segment left lower lobe 6 mm nodule on image 20/series 4 measured 4 mm on the prior (when remeasured). Musculoskeletal: No acute osseous abnormality. Anterior right second third rib sclerotic foci are unchanged and likely bone islands. CT ABDOMEN AND PELVIS Hepatobiliary: Normal liver. Normal gallbladder, without biliary ductal dilatation. Pancreas: Normal, without mass or ductal dilatation. Spleen: Normal in size, without focal abnormality. Adrenals/Urinary Tract: Normal adrenal glands. Interpolar left renal lesion is too small to characterize but likely a cyst. Normal right kidney, without hydronephrosis. Normal urinary bladder. Stomach/Bowel: Normal stomach, without wall thickening. Scattered colonic diverticula. Normal terminal ileum and appendix. Normal small bowel. Vascular/Lymphatic: Aortic and branch vessel atherosclerosis. No abdominopelvic adenopathy. Reproductive: Hysterectomy.  No adnexal mass. Other: No significant free fluid. No evidence of omental or peritoneal disease. Musculoskeletal: Tiny pelvic sclerotic lesions which are unchanged and favored to represent bone islands. Convex left lumbar spine curvature. IMPRESSION: CT CHEST IMPRESSION 1. Similar appearance of right-sided peribronchovascular tumor with areas of septal thickening and more masslike consolidation. 2. Innumerable pulmonary nodules, slightly progressive. 3. Similar to decreased thoracic  adenopathy. 4. Similar small right pleural effusion with pleural thickening, suggesting malignancy. A tiny left pleural effusion is new. CT ABDOMEN AND PELVIS IMPRESSION 1. No acute process or evidence of metastatic disease in the abdomen or pelvis. 2. Hysterectomy. Electronically Signed   By: Abigail Miyamoto M.D.   On: 01/19/2016 12:39   Ct Abdomen Pelvis W Contrast  01/19/2016  CLINICAL DATA:  Restaging of right-sided lung cancer diagnosed 9/16 with chemotherapy finishing 3 weeks ago. Breast cancer 18 years ago with right lumpectomy and radiation therapy. Restaging. EXAM: CT CHEST, ABDOMEN, AND PELVIS WITH CONTRAST TECHNIQUE: Multidetector CT imaging of the chest, abdomen and pelvis was performed following the standard protocol during bolus administration of intravenous contrast. CONTRAST:  122m OMNIPAQUE IOHEXOL 300 MG/ML  SOLN COMPARISON:  11/16/2015 FINDINGS: CT CHEST Mediastinum/Nodes: Low right jugular node is similar at 6 mm on image 8/series 2. Pretracheal node measures 8 mm on image 19/series 2 versus 12 mm on the prior. Right hilar node is similar at 1.0 cm. Aortic and branch vessel atherosclerosis. Mild cardiomegaly with no pericardial effusion. No central pulmonary embolism, on this non-dedicated study. Lungs/Pleura: Similar small right pleural effusion with right-sided pleural thickening and hyper attenuation or hyper enhancement. Parietal pleural thickening measures 7 mm today on image 57/series 2. Similar to 6  mm on the prior. Trace left pleural fluid is new. Patent endobronchial tree. Redemonstration of areas of peribronchovascular predominant interstitial thickening and masslike consolidation throughout the right lung. Similar, but difficult to measure secondary to ill-defined nature. The index component within the central right upper lobe measures 2.7 x 1.7 cm on image 23/series 4. Compare 3.2 x 1.6 cm on the prior exam. Innumerable subpleural predominant pulmonary nodules are again identified.  Some are minimally enlarged. Example medial left lower lobe at 7 mm on image 40/series 4, 5 mm the same level on the prior. index left lower lobe nodule on the prior exam measures 11 mm on image 35 series 4 today versus 10 mm on the prior. A subpleural superior segment left lower lobe 6 mm nodule on image 20/series 4 measured 4 mm on the prior (when remeasured). Musculoskeletal: No acute osseous abnormality. Anterior right second third rib sclerotic foci are unchanged and likely bone islands. CT ABDOMEN AND PELVIS Hepatobiliary: Normal liver. Normal gallbladder, without biliary ductal dilatation. Pancreas: Normal, without mass or ductal dilatation. Spleen: Normal in size, without focal abnormality. Adrenals/Urinary Tract: Normal adrenal glands. Interpolar left renal lesion is too small to characterize but likely a cyst. Normal right kidney, without hydronephrosis. Normal urinary bladder. Stomach/Bowel: Normal stomach, without wall thickening. Scattered colonic diverticula. Normal terminal ileum and appendix. Normal small bowel. Vascular/Lymphatic: Aortic and branch vessel atherosclerosis. No abdominopelvic adenopathy. Reproductive: Hysterectomy.  No adnexal mass. Other: No significant free fluid. No evidence of omental or peritoneal disease. Musculoskeletal: Tiny pelvic sclerotic lesions which are unchanged and favored to represent bone islands. Convex left lumbar spine curvature. IMPRESSION: CT CHEST IMPRESSION 1. Similar appearance of right-sided peribronchovascular tumor with areas of septal thickening and more masslike consolidation. 2. Innumerable pulmonary nodules, slightly progressive. 3. Similar to decreased thoracic adenopathy. 4. Similar small right pleural effusion with pleural thickening, suggesting malignancy. A tiny left pleural effusion is new. CT ABDOMEN AND PELVIS IMPRESSION 1. No acute process or evidence of metastatic disease in the abdomen or pelvis. 2. Hysterectomy. Electronically Signed   By:  Abigail Miyamoto M.D.   On: 01/19/2016 12:39    ASSESSMENT AND PLAN: This is a very pleasant 70 years old white female recently diagnosed with stage IV non-small cell lung cancer, adenocarcinoma with no detectable actionable mutations. She status post 6 cycles of induction chemotherapy with carboplatin and Alimta and tolerated her treatment fairly well. The recent CT scan of the chest, abdomen and pelvis showed no concerning findings for disease progression except for few pulmonary nodules that increased by 1-2 millimeters in size recently. I discussed the scan results and showed the images to the patient and her husband. I recommended for her to consider maintenance chemotherapy with single agent Alimta 500 MG/M2 every 3 weeks. I discussed with the patient adverse effect of this treatment and she is interested in proceeding with it. She is expected to start the first dose of this treatment on 02/03/2016. The patient would come back for follow-up visit in one month's for reevaluation with the start of cycle #2. The patient was advised to call immediately if she has any concerning symptoms in the interval. The patient voices understanding of current disease status and treatment options and is in agreement with the current care plan.  All questions were answered. The patient knows to call the clinic with any problems, questions or concerns. We can certainly see the patient much sooner if necessary.  Disclaimer: This note was dictated with voice recognition software. Similar sounding  words can inadvertently be transcribed and may not be corrected upon review.

## 2016-01-27 ENCOUNTER — Other Ambulatory Visit (HOSPITAL_BASED_OUTPATIENT_CLINIC_OR_DEPARTMENT_OTHER): Payer: Medicare Other

## 2016-01-27 DIAGNOSIS — C3411 Malignant neoplasm of upper lobe, right bronchus or lung: Secondary | ICD-10-CM

## 2016-01-27 DIAGNOSIS — C3491 Malignant neoplasm of unspecified part of right bronchus or lung: Secondary | ICD-10-CM

## 2016-01-27 LAB — CBC WITH DIFFERENTIAL/PLATELET
BASO%: 2.9 % — ABNORMAL HIGH (ref 0.0–2.0)
BASOS ABS: 0.2 10*3/uL — AB (ref 0.0–0.1)
EOS%: 0.5 % (ref 0.0–7.0)
Eosinophils Absolute: 0 10*3/uL (ref 0.0–0.5)
HCT: 34.1 % — ABNORMAL LOW (ref 34.8–46.6)
HEMOGLOBIN: 11.4 g/dL — AB (ref 11.6–15.9)
LYMPH%: 19.1 % (ref 14.0–49.7)
MCH: 30.9 pg (ref 25.1–34.0)
MCHC: 33.5 g/dL (ref 31.5–36.0)
MCV: 92.3 fL (ref 79.5–101.0)
MONO#: 1.3 10*3/uL — ABNORMAL HIGH (ref 0.1–0.9)
MONO%: 17.8 % — AB (ref 0.0–14.0)
NEUT#: 4.3 10*3/uL (ref 1.5–6.5)
NEUT%: 59.7 % (ref 38.4–76.8)
Platelets: 230 10*3/uL (ref 145–400)
RBC: 3.69 10*6/uL — AB (ref 3.70–5.45)
RDW: 17.2 % — AB (ref 11.2–14.5)
WBC: 7.2 10*3/uL (ref 3.9–10.3)
lymph#: 1.4 10*3/uL (ref 0.9–3.3)

## 2016-01-27 LAB — COMPREHENSIVE METABOLIC PANEL
ALBUMIN: 3 g/dL — AB (ref 3.5–5.0)
ALK PHOS: 123 U/L (ref 40–150)
ALT: 13 U/L (ref 0–55)
AST: 41 U/L — AB (ref 5–34)
Anion Gap: 10 mEq/L (ref 3–11)
BUN: 11.1 mg/dL (ref 7.0–26.0)
CHLORIDE: 105 meq/L (ref 98–109)
CO2: 23 meq/L (ref 22–29)
Calcium: 9.4 mg/dL (ref 8.4–10.4)
Creatinine: 0.8 mg/dL (ref 0.6–1.1)
EGFR: 76 mL/min/{1.73_m2} — AB (ref >90)
GLUCOSE: 89 mg/dL (ref 70–140)
POTASSIUM: 3.6 meq/L (ref 3.5–5.1)
SODIUM: 138 meq/L (ref 136–145)
Total Bilirubin: 0.5 mg/dL (ref 0.20–1.20)
Total Protein: 6.2 g/dL — ABNORMAL LOW (ref 6.4–8.3)

## 2016-02-03 ENCOUNTER — Ambulatory Visit (HOSPITAL_BASED_OUTPATIENT_CLINIC_OR_DEPARTMENT_OTHER): Payer: Medicare Other

## 2016-02-03 ENCOUNTER — Other Ambulatory Visit (HOSPITAL_BASED_OUTPATIENT_CLINIC_OR_DEPARTMENT_OTHER): Payer: Medicare Other

## 2016-02-03 VITALS — BP 135/74 | HR 102 | Temp 97.9°F | Resp 21

## 2016-02-03 DIAGNOSIS — C3411 Malignant neoplasm of upper lobe, right bronchus or lung: Secondary | ICD-10-CM

## 2016-02-03 DIAGNOSIS — Z5111 Encounter for antineoplastic chemotherapy: Secondary | ICD-10-CM

## 2016-02-03 DIAGNOSIS — C3491 Malignant neoplasm of unspecified part of right bronchus or lung: Secondary | ICD-10-CM

## 2016-02-03 LAB — CBC WITH DIFFERENTIAL/PLATELET
BASO%: 1.1 % (ref 0.0–2.0)
BASOS ABS: 0.1 10*3/uL (ref 0.0–0.1)
EOS%: 0 % (ref 0.0–7.0)
Eosinophils Absolute: 0 10*3/uL (ref 0.0–0.5)
HEMATOCRIT: 36 % (ref 34.8–46.6)
HEMOGLOBIN: 11.7 g/dL (ref 11.6–15.9)
LYMPH#: 1.5 10*3/uL (ref 0.9–3.3)
LYMPH%: 11.5 % — ABNORMAL LOW (ref 14.0–49.7)
MCH: 30.6 pg (ref 25.1–34.0)
MCHC: 32.4 g/dL (ref 31.5–36.0)
MCV: 94.2 fL (ref 79.5–101.0)
MONO#: 1.1 10*3/uL — AB (ref 0.1–0.9)
MONO%: 8.1 % (ref 0.0–14.0)
NEUT#: 10.6 10*3/uL — ABNORMAL HIGH (ref 1.5–6.5)
NEUT%: 79.3 % — ABNORMAL HIGH (ref 38.4–76.8)
Platelets: 248 10*3/uL (ref 145–400)
RBC: 3.82 10*6/uL (ref 3.70–5.45)
RDW: 17.1 % — AB (ref 11.2–14.5)
WBC: 13.4 10*3/uL — ABNORMAL HIGH (ref 3.9–10.3)

## 2016-02-03 LAB — COMPREHENSIVE METABOLIC PANEL
ALBUMIN: 3.1 g/dL — AB (ref 3.5–5.0)
ALK PHOS: 119 U/L (ref 40–150)
ALT: 15 U/L (ref 0–55)
AST: 41 U/L — AB (ref 5–34)
Anion Gap: 10 mEq/L (ref 3–11)
BILIRUBIN TOTAL: 0.4 mg/dL (ref 0.20–1.20)
BUN: 16.9 mg/dL (ref 7.0–26.0)
CALCIUM: 9.5 mg/dL (ref 8.4–10.4)
CO2: 21 mEq/L — ABNORMAL LOW (ref 22–29)
CREATININE: 0.8 mg/dL (ref 0.6–1.1)
Chloride: 109 mEq/L (ref 98–109)
EGFR: 74 mL/min/{1.73_m2} — ABNORMAL LOW (ref >90)
Glucose: 100 mg/dl (ref 70–140)
Potassium: 3.9 mEq/L (ref 3.5–5.1)
Sodium: 140 mEq/L (ref 136–145)
Total Protein: 6.3 g/dL — ABNORMAL LOW (ref 6.4–8.3)

## 2016-02-03 MED ORDER — SODIUM CHLORIDE 0.9% FLUSH
10.0000 mL | INTRAVENOUS | Status: DC | PRN
  Filled 2016-02-03: qty 10

## 2016-02-03 MED ORDER — CHLORPROMAZINE HCL 25 MG PO TABS
ORAL_TABLET | ORAL | Status: AC
  Filled 2016-02-03: qty 1

## 2016-02-03 MED ORDER — HEPARIN SOD (PORK) LOCK FLUSH 100 UNIT/ML IV SOLN
500.0000 [IU] | Freq: Once | INTRAVENOUS | Status: DC | PRN
  Filled 2016-02-03: qty 5

## 2016-02-03 MED ORDER — SODIUM CHLORIDE 0.9 % IV SOLN
510.0000 mg/m2 | Freq: Once | INTRAVENOUS | Status: AC
  Administered 2016-02-03: 900 mg via INTRAVENOUS
  Filled 2016-02-03: qty 32

## 2016-02-03 MED ORDER — PROCHLORPERAZINE MALEATE 10 MG PO TABS
10.0000 mg | ORAL_TABLET | Freq: Once | ORAL | Status: AC
  Administered 2016-02-03: 10 mg via ORAL

## 2016-02-03 MED ORDER — SODIUM CHLORIDE 0.9 % IV SOLN
Freq: Once | INTRAVENOUS | Status: AC
  Administered 2016-02-03: 14:00:00 via INTRAVENOUS

## 2016-02-03 NOTE — Patient Instructions (Signed)
[facility] Discharge Instructions for Patients Receiving Chemotherapy  Today you received the following chemotherapy agents; Alimta.   To help prevent nausea and vomiting after your treatment, we encourage you to take your nausea medication as directed.    If you develop nausea and vomiting that is not controlled by your nausea medication, call the clinic.   BELOW ARE SYMPTOMS THAT SHOULD BE REPORTED IMMEDIATELY:  *FEVER GREATER THAN 100.5 F  *CHILLS WITH OR WITHOUT FEVER  NAUSEA AND VOMITING THAT IS NOT CONTROLLED WITH YOUR NAUSEA MEDICATION  *UNUSUAL SHORTNESS OF BREATH  *UNUSUAL BRUISING OR BLEEDING  TENDERNESS IN MOUTH AND THROAT WITH OR WITHOUT PRESENCE OF ULCERS  *URINARY PROBLEMS  *BOWEL PROBLEMS  UNUSUAL RASH Items with * indicate a potential emergency and should be followed up as soon as possible.  Feel free to call the clinic you have any questions or concerns. The clinic phone number is [phone].  Please show the CHEMO ALERT CARD at check-in to the Emergency Department and triage nurse.   

## 2016-02-10 ENCOUNTER — Other Ambulatory Visit (HOSPITAL_BASED_OUTPATIENT_CLINIC_OR_DEPARTMENT_OTHER): Payer: Medicare Other

## 2016-02-10 ENCOUNTER — Other Ambulatory Visit: Payer: Self-pay | Admitting: *Deleted

## 2016-02-10 ENCOUNTER — Ambulatory Visit (HOSPITAL_BASED_OUTPATIENT_CLINIC_OR_DEPARTMENT_OTHER): Payer: Medicare Other | Admitting: Nurse Practitioner

## 2016-02-10 ENCOUNTER — Ambulatory Visit (HOSPITAL_COMMUNITY)
Admission: RE | Admit: 2016-02-10 | Discharge: 2016-02-10 | Disposition: A | Discharge status: Home / Self Care | Payer: Medicare Other | Source: Ambulatory Visit | Attending: Nurse Practitioner | Admitting: Nurse Practitioner

## 2016-02-10 ENCOUNTER — Telehealth: Payer: Self-pay | Admitting: Nurse Practitioner

## 2016-02-10 ENCOUNTER — Encounter: Payer: Self-pay | Admitting: Nurse Practitioner

## 2016-02-10 ENCOUNTER — Ambulatory Visit (HOSPITAL_BASED_OUTPATIENT_CLINIC_OR_DEPARTMENT_OTHER): Payer: Medicare Other

## 2016-02-10 ENCOUNTER — Telehealth: Payer: Self-pay | Admitting: *Deleted

## 2016-02-10 VITALS — BP 123/69 | HR 122 | Temp 98.5°F | Resp 18 | Ht 69.0 in | Wt 132.6 lb

## 2016-02-10 DIAGNOSIS — R0602 Shortness of breath: Secondary | ICD-10-CM | POA: Diagnosis not present

## 2016-02-10 DIAGNOSIS — C3491 Malignant neoplasm of unspecified part of right bronchus or lung: Secondary | ICD-10-CM | POA: Insufficient documentation

## 2016-02-10 DIAGNOSIS — R509 Fever, unspecified: Secondary | ICD-10-CM

## 2016-02-10 DIAGNOSIS — E46 Unspecified protein-calorie malnutrition: Secondary | ICD-10-CM

## 2016-02-10 DIAGNOSIS — E8809 Other disorders of plasma-protein metabolism, not elsewhere classified: Secondary | ICD-10-CM | POA: Insufficient documentation

## 2016-02-10 DIAGNOSIS — C3411 Malignant neoplasm of upper lobe, right bronchus or lung: Secondary | ICD-10-CM

## 2016-02-10 DIAGNOSIS — R0600 Dyspnea, unspecified: Secondary | ICD-10-CM

## 2016-02-10 DIAGNOSIS — T451X5A Adverse effect of antineoplastic and immunosuppressive drugs, initial encounter: Secondary | ICD-10-CM

## 2016-02-10 DIAGNOSIS — J90 Pleural effusion, not elsewhere classified: Secondary | ICD-10-CM

## 2016-02-10 DIAGNOSIS — N390 Urinary tract infection, site not specified: Secondary | ICD-10-CM | POA: Diagnosis not present

## 2016-02-10 DIAGNOSIS — R059 Cough, unspecified: Secondary | ICD-10-CM

## 2016-02-10 DIAGNOSIS — R05 Cough: Secondary | ICD-10-CM

## 2016-02-10 DIAGNOSIS — R110 Nausea: Secondary | ICD-10-CM

## 2016-02-10 DIAGNOSIS — K5900 Constipation, unspecified: Secondary | ICD-10-CM | POA: Insufficient documentation

## 2016-02-10 DIAGNOSIS — E860 Dehydration: Secondary | ICD-10-CM | POA: Diagnosis not present

## 2016-02-10 DIAGNOSIS — K5902 Outlet dysfunction constipation: Secondary | ICD-10-CM

## 2016-02-10 DIAGNOSIS — R0609 Other forms of dyspnea: Secondary | ICD-10-CM

## 2016-02-10 LAB — CBC WITH DIFFERENTIAL/PLATELET
BASO%: 0.9 % (ref 0.0–2.0)
BASOS ABS: 0 10*3/uL (ref 0.0–0.1)
EOS ABS: 0 10*3/uL (ref 0.0–0.5)
EOS%: 0.7 % (ref 0.0–7.0)
HEMATOCRIT: 35.1 % (ref 34.8–46.6)
HEMOGLOBIN: 11.8 g/dL (ref 11.6–15.9)
LYMPH#: 1.1 10*3/uL (ref 0.9–3.3)
LYMPH%: 27.1 % (ref 14.0–49.7)
MCH: 30.5 pg (ref 25.1–34.0)
MCHC: 33.6 g/dL (ref 31.5–36.0)
MCV: 90.8 fL (ref 79.5–101.0)
MONO#: 0.6 10*3/uL (ref 0.1–0.9)
MONO%: 13.2 % (ref 0.0–14.0)
NEUT#: 2.4 10*3/uL (ref 1.5–6.5)
NEUT%: 58.1 % (ref 38.4–76.8)
Platelets: 149 10*3/uL (ref 145–400)
RBC: 3.86 10*6/uL (ref 3.70–5.45)
RDW: 15.7 % — AB (ref 11.2–14.5)
WBC: 4.2 10*3/uL (ref 3.9–10.3)

## 2016-02-10 LAB — COMPREHENSIVE METABOLIC PANEL
ALBUMIN: 2.6 g/dL — AB (ref 3.5–5.0)
ALK PHOS: 114 U/L (ref 40–150)
ALT: 10 U/L (ref 0–55)
ANION GAP: 10 meq/L (ref 3–11)
AST: 38 U/L — AB (ref 5–34)
BUN: 11.5 mg/dL (ref 7.0–26.0)
CALCIUM: 9.1 mg/dL (ref 8.4–10.4)
CHLORIDE: 101 meq/L (ref 98–109)
CO2: 25 mEq/L (ref 22–29)
Creatinine: 0.7 mg/dL (ref 0.6–1.1)
EGFR: 82 mL/min/{1.73_m2} — AB (ref >90)
Glucose: 97 mg/dl (ref 70–140)
POTASSIUM: 4.1 meq/L (ref 3.5–5.1)
Sodium: 135 mEq/L — ABNORMAL LOW (ref 136–145)
Total Bilirubin: 1.14 mg/dL (ref 0.20–1.20)
Total Protein: 6 g/dL — ABNORMAL LOW (ref 6.4–8.3)

## 2016-02-10 LAB — URINALYSIS, MICROSCOPIC - CHCC
Bilirubin (Urine): NEGATIVE
Blood: NEGATIVE
GLUCOSE UR CHCC: NEGATIVE mg/dL
KETONES: 15 mg/dL
Nitrite: NEGATIVE
PH: 6 (ref 4.6–8.0)
RBC / HPF: NEGATIVE (ref 0–2)
Specific Gravity, Urine: 1.01 (ref 1.003–1.035)
UROBILINOGEN UR: 0.2 mg/dL (ref 0.2–1)

## 2016-02-10 MED ORDER — CIPROFLOXACIN HCL 500 MG PO TABS: 500.0000 mg | ORAL_TABLET | Freq: Two times a day (BID) | ORAL | Status: DC

## 2016-02-10 MED ORDER — SODIUM CHLORIDE 0.9 % IV SOLN
Freq: Once | INTRAVENOUS | Status: AC
  Administered 2016-02-10: 14:00:00 via INTRAVENOUS
  Filled 2016-02-10: qty 4

## 2016-02-10 MED ORDER — IOHEXOL 350 MG/ML SOLN
100.0000 mL | Freq: Once | INTRAVENOUS | Status: AC | PRN
  Administered 2016-02-10: 100 mL via INTRAVENOUS

## 2016-02-10 MED ORDER — SODIUM CHLORIDE 0.9 % IV SOLN
Freq: Once | INTRAVENOUS | Status: AC
  Administered 2016-02-10: 14:00:00 via INTRAVENOUS

## 2016-02-10 NOTE — Assessment & Plan Note (Signed)
She states that she has been suffering with constipation for approximately one week.  She states that she has been drinking prune juice with minimal effectiveness.  She states she avoids stool softeners and laxatives; because they cause her abdomen to cramp.  Advised patient that she would need to proceed with stool softeners and/or laxatives.  It is typical for laxatives to call some abdominal cramping; the patient should proceed with the stool softeners and laxatives as directed.  To prevent any further issues with constipation.

## 2016-02-10 NOTE — Assessment & Plan Note (Addendum)
Patient states that she has been experiencing frequency of urination since beginning of this week.  She denies any dysuria or hematuria.  She has been experiencing some chronic nausea; but no vomiting; which she is associating with her most recent cycle of chemotherapy.  She notes she had a fever to maximum 100.5 intermittently since this past Monday, 02/07/2016.  Urinalysis obtained today revealed no hematuria, negative nitrites, small amount of leukocyte  esterase, WBCs 3-6, and trace bacteria.  Urine culture results pending.  Blood pressure stable at 123/69, pulse increased to 122, temperature 98.5 today. _____________________________________________________________________________  Patient will be prescribed Cipro 500 mg twice daily to take for treatment of urinary tract infection.  Culture results are pending.

## 2016-02-10 NOTE — Assessment & Plan Note (Signed)
Patient has had minimal appetite within this past week.  She states that she has been pushing fluids fairly well though.  Patient does appear dehydrated today; with sodium level down to 135.  Patient will receive 1 L normal saline IV fluid rehydration while at the cancer [facility] today.  She was also encouraged to push fluids at home.

## 2016-02-10 NOTE — Assessment & Plan Note (Signed)
Patient reports chronic nausea for the past few days.  Patient feels that her nausea is secondary to her recent chemotherapy.  She denies any vomiting.  She has had minimal appetite and poor oral intake recently.  She appears dehydrated today.  Confirmed the patient does have anti--nausea medications already at home.  Patient will receive IV fluid rehydration while at the cancer Center today.

## 2016-02-10 NOTE — Patient Instructions (Signed)

## 2016-02-10 NOTE — Assessment & Plan Note (Signed)
Patient is status post carboplatin/Alimta chemotherapy regimen 6 cycles completed on 12/30/2015.  Restaging scan obtained on 01/19/2016 of the chest/abdomen/pelvis was stable.  Patient initiated single agent maintenance Alimta chemotherapy with her first cycle on 02/03/2016.  Patient is scheduled for labs, visit, and chemotherapy again on 02/24/2016.

## 2016-02-10 NOTE — Telephone Encounter (Signed)
Confirmed date/time of appt for pt in [facility] today

## 2016-02-10 NOTE — Assessment & Plan Note (Signed)
Albumin has decreased from 3.1 down to 2.6.  Patient states she's had minimal to no appetite this week.  Patient was encouraged to eat multiple small meals throughout the day; and to push protein.

## 2016-02-10 NOTE — Assessment & Plan Note (Addendum)
Patient is status post 6 cycles of carboplatin/Alimta chemotherapy completed on 12/30/2015.  She underwent a restaging CT of the chest/abdomen/pelvis on 01/19/2016; which showed stability of her disease.  She initiated single agent maintenance Alimta on 02/03/2016.  Patient reported to the [facility] today with complaint of increased fatigue and generalized weakness.  She states that she has been too weak to get out of bed for the last 3 days.  She reports she's had some frequency of urination; but denies any dysuria or hematuria.  She has had a dry cough; but no URI symptoms.  She feels slightly short of breath; and does complain of pain with inspiration.  She has been suffering with some chronic nausea; but no vomiting.  She also reports that she has been constipated for approximately one week.  She has had a fever to maximum 100.5 with chills.  Patient appears fatigued and increasingly weak on exam.  She was resting her head on the exam table when I arrived for the exam.  Breath sounds bilaterally were essentially clear; but with decreased air movement.  There was no wheezing or coughing noted.  Patient does appear slightly short of breath.  O2 sat has decreased down to 92% on room air.  Given patient's lower O2 sats, elevated pulse rate, a plane of pain with inspiration, and history of recent fever-we will order a CT angiogram of the chest to rule out pulmonary embolism.  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  Blood counts obtained today were essentially normal.  Patient does appear to have a mild urinary tract infection.  Urine culture results pending.  Also obtain blood cultures 2 which were pending results as well. _________________________________________________________  CT angiogram of the chest obtained earlier today was negative for pulmonary embolism.  Lung cancer appears stable compared to prior CT of  01/19/2016.  There were noted.  Bilateral pleural effusions with a left pleural effusion, slightly larger when compared to prior CT.  Reviewed the scan results with both patient and her husband in detail.  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  Vital sign recheck revealed O2 sat 94-95% on room air.  Patient and her husband were advised to call/return ago directly to the emergency department for any worsening symptoms whatsoever.  Advised both patient and her husband may require a repeat thoracentesis or placement of a Pleurx catheter if pleural effusions.  Progress and cause increased dyspnea or discomfort.  Both patient and her husband stated understanding of all instructions.

## 2016-02-10 NOTE — Telephone Encounter (Signed)
Received call from patient's husband [name]. Reports patient has been having fever of 100.5 to 100.6 since yesterday, chills, nausea not relieved by medication. Urgent pof sent for appt with Selena Lesser along with stat labs.

## 2016-02-10 NOTE — Progress Notes (Signed)
SYMPTOM MANAGEMENT CLINIC   HPI: Deborah [name] 70 y.o. female diagnosed with lung cancer.  Patient is status post 6 cycles of carboplatin/Alimta chemotherapy completed on 12/30/2015.  Patient initiated single agent maintenance Alimta chemotherapy on 02/03/2016.  Patient is status post 6 cycles of carboplatin/Alimta chemotherapy completed on 12/30/2015.  She underwent a restaging CT of the chest/abdomen/pelvis on 01/19/2016; which showed stability of her disease.  She initiated single agent maintenance Alimta on 02/03/2016.  Patient reported to the [facility] today with complaint of increased fatigue and generalized weakness.  She states that she has been too weak to get out of bed for the last 3 days.  She reports she's had some frequency of urination; but denies any dysuria or hematuria.  She has had a dry cough; but no URI symptoms.  She feels slightly short of breath; and does complain of pain with inspiration.  She has been suffering with some chronic nausea; but no vomiting.  She also reports that she has been constipated for approximately one week.  She has had a fever to maximum 100.5 with chills.  Patient appears fatigued and increasingly weak on exam.  She was resting her head on the exam table when I arrived for the exam.  Breath sounds bilaterally were essentially clear; but with decreased air movement.  There was no wheezing or coughing noted.  Patient does appear slightly short of breath.  O2 sat has decreased down to 92% on room air.  Given patient's lower O2 sats, elevated pulse rate, a plane of pain with inspiration, and history of recent fever-we will order a CT angiogram of the chest to rule out pulmonary embolism.  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  Blood counts obtained today were essentially normal.  Patient does appear to have a mild urinary tract infection.  Urine culture results  pending.  Also obtain blood cultures 2 which were pending results as well.   HPI  Review of Systems  Constitutional: Positive for fever, chills and malaise/fatigue.  HENT: Negative for sore throat.   Respiratory: Positive for cough and shortness of breath. Negative for hemoptysis, sputum production and wheezing.   Gastrointestinal: Positive for nausea and constipation. Negative for vomiting, abdominal pain, diarrhea, blood in stool and melena.  Genitourinary: Positive for frequency. Negative for dysuria, urgency, hematuria and flank pain.  Neurological: Positive for weakness and headaches.  All other systems reviewed and are negative.   Past Medical History  Diagnosis Date  . Allergy     Mold  . Hyperlipidemia 08/04/2011  . Hypothyroidism 08/04/2011  . Atrophic vaginitis   . Osteopenia 10/2012    T score -1.7 FRAX 7%/0.3%  . Vertigo   . Shortness of breath dyspnea   . Meniere's disease   . Encounter for antineoplastic chemotherapy 10/28/2015  . 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 Basal cell carcinoma of skin 2010  . Non-small cell lung cancer [facility]) September 2016    Adenocarcinoma  . Malignant neoplasm of right upper lobe of lung ([place]) 08/26/2015    Past Surgical History  Procedure Laterality Date  . Tonsillectomy  1952  . Vaginal hysterectomy  1980  . Breast biopsy  1999    RADIATION TREATMENTS  . Breast lumpectomy Right 1998    right  . Chest tube insertion Right 09/15/2015    Procedure: INSERTION RIGHT  PLEURAL DRAINAGE CATHETER;  Surgeon: Melrose Nakayama, MD;  Location: [place];  Service: Thoracic;  Laterality: Right;    has Hypothyroidism; Preventative health care; Allergic rhinitis, cause unspecified; Diverticulosis; Hyperlipidemia; Atrophic vaginitis; Pleural effusion, malignant; Malignant neoplasm of right upper lobe of lung ([place]); Anxiety; Chemotherapy-induced nausea; Encounter for antineoplastic chemotherapy;  Dehydration; Constipation; UTI (urinary tract infection); Dyspnea; and Hypoalbuminemia due to protein-calorie malnutrition ([place]) on her problem list.    has No Known Allergies.    Medication List       This list is accurate as of: 02/10/16  2:27 PM.  Always use your most recent med list.               albuterol 108 (90 Base) MCG/ACT inhaler  Commonly known as:  PROVENTIL HFA;VENTOLIN HFA  Inhale 2 puffs into the lungs every 6 (six) hours as needed for wheezing or shortness of breath.     ciprofloxacin 500 MG tablet  Commonly known as:  CIPRO  Take 1 tablet (500 mg total) by mouth 2 (two) times daily.     dexamethasone 4 MG tablet  Commonly known as:  DECADRON  4 mg po bid the day before, day of and day after chemo     folic acid 1 MG tablet  Commonly known as:  FOLVITE  Take 1 tablet (1 mg total) by mouth daily.     HYDROcodone-homatropine 5-1.5 MG/5ML syrup  Commonly known as:  HYCODAN  Take 5 mLs by mouth every 6 (six) hours as needed for cough.     levothyroxine 75 MCG tablet  Commonly known as:  SYNTHROID, LEVOTHROID  TAKE 1 TABLET BY MOUTH DAILY.     LORazepam 0.5 MG tablet  Commonly known as:  ATIVAN  Take one every eight hours as needed for anxiety.  Prefers one at bedtime.     ondansetron 8 MG tablet  Commonly known as:  ZOFRAN  Take 1 tablet (8 mg total) by mouth every 8 (eight) hours as needed for nausea or vomiting.     ondansetron 8 MG tablet  Commonly known as:  ZOFRAN  TAKE 1 TABLET BY MOUTH EVERY 8 HOURS AS NEEDED FOR NAUSEA OR VOMITING     prochlorperazine 10 MG tablet  Commonly known as:  COMPAZINE  Take 1 tablet (10 mg total) by mouth every 6 (six) hours as needed for nausea or vomiting.     triamterene-hydrochlorothiazide 37.5-25 MG capsule  Commonly known as:  DYAZIDE  Take 1 capsule by mouth daily as needed. Reported on 02/10/2016     Vitamin D-3 1000 units Caps  Take 1 capsule by mouth daily.         PHYSICAL EXAMINATION  Oncology  Vitals 02/10/2016 02/03/2016  Height 175 cm -  Weight 60.147 kg -  Weight (lbs) 132 lbs 10 oz -  BMI (kg/m2) 19.58 kg/m2 -  Temp 98.5 97.9  Pulse 122 102  Resp 18 21  SpO2 92 97  BSA (m2) 1.71 m2 -   BP Readings from Last 2 Encounters:  02/10/16 123/69  02/03/16 135/74    Physical Exam  Constitutional: She is oriented to person, place, and time. She appears dehydrated.  Patient appears fatigued and weak.  HENT:  Head: Normocephalic and atraumatic.  Mouth/Throat: Oropharynx is clear and moist.  Eyes: Conjunctivae and EOM are normal. Pupils are equal, round, and reactive to light. Right eye exhibits no discharge. Left eye exhibits no discharge. No scleral icterus.  Neck: Normal range of motion.  Neck supple. No JVD present. No tracheal deviation present. No thyromegaly present.  Cardiovascular: Regular rhythm, normal heart sounds and intact distal pulses.   Tachycardia  Pulmonary/Chest: No stridor. No respiratory distress. She has no wheezes. She has no rales. She exhibits no tenderness.  Slightly decreased breath sounds bilaterally; but no cough or wheeze.  Patient appears slightly short of breath.  Abdominal: Soft. Bowel sounds are normal. She exhibits no distension and no mass. There is no tenderness. There is no rebound and no guarding.  Musculoskeletal: Normal range of motion. She exhibits no edema or tenderness.  Lymphadenopathy:    She has no cervical adenopathy.  Neurological: She is alert and oriented to person, place, and time.  Skin: Skin is warm and dry. No rash noted. No erythema. There is pallor.  Psychiatric: Affect normal.    LABORATORY DATA:. Appointment on 02/10/2016  Component Date Value Ref Range Status  . WBC 02/10/2016 4.2  3.9 - 10.3 10e3/uL Final  . NEUT# 02/10/2016 2.4  1.5 - 6.5 10e3/uL Final  . HGB 02/10/2016 11.8  11.6 - 15.9 g/dL Final  . HCT 36/18/7489 35.1  34.8 - 46.6 % Final  . Platelets 02/10/2016 149  145 - 400 10e3/uL Final  . MCV 02/10/2016  90.8  79.5 - 101.0 fL Final  . MCH 02/10/2016 30.5  25.1 - 34.0 pg Final  . MCHC 02/10/2016 33.6  31.5 - 36.0 g/dL Final  . RBC 73/57/2424 3.86  3.70 - 5.45 10e6/uL Final  . RDW 02/10/2016 15.7* 11.2 - 14.5 % Final  . lymph# 02/10/2016 1.1  0.9 - 3.3 10e3/uL Final  . MONO# 02/10/2016 0.6  0.1 - 0.9 10e3/uL Final  . Eosinophils Absolute 02/10/2016 0.0  0.0 - 0.5 10e3/uL Final  . Basophils Absolute 02/10/2016 0.0  0.0 - 0.1 10e3/uL Final  . NEUT% 02/10/2016 58.1  38.4 - 76.8 % Final  . LYMPH% 02/10/2016 27.1  14.0 - 49.7 % Final  . MONO% 02/10/2016 13.2  0.0 - 14.0 % Final  . EOS% 02/10/2016 0.7  0.0 - 7.0 % Final  . BASO% 02/10/2016 0.9  0.0 - 2.0 % Final  . Sodium 02/10/2016 135* 136 - 145 mEq/L Final  . Potassium 02/10/2016 4.1  3.5 - 5.1 mEq/L Final  . Chloride 02/10/2016 101  98 - 109 mEq/L Final  . CO2 02/10/2016 25  22 - 29 mEq/L Final  . Glucose 02/10/2016 97  70 - 140 mg/dl Final   Glucose reference range is for nonfasting patients. Fasting glucose reference range is 70- 100.  Marland Kitchen BUN 02/10/2016 11.5  7.0 - 26.0 mg/dL Final  . Creatinine 24/44/5367 0.7  0.6 - 1.1 mg/dL Final  . Total Bilirubin 02/10/2016 1.14  0.20 - 1.20 mg/dL Final  . Alkaline Phosphatase 02/10/2016 114  40 - 150 U/L Final  . AST 02/10/2016 38* 5 - 34 U/L Final  . ALT 02/10/2016 10  0 - 55 U/L Final  . Total Protein 02/10/2016 6.0* 6.4 - 8.3 g/dL Final  . Albumin 61/14/1125 2.6* 3.5 - 5.0 g/dL Final  . Calcium 93/27/2888 9.1  8.4 - 10.4 mg/dL Final  . Anion Gap 99/07/5910 10  3 - 11 mEq/L Final  . EGFR 02/10/2016 82* >90 ml/min/1.73 m2 Final   eGFR is calculated using the CKD-EPI Creatinine Equation (2009)  . Glucose 02/10/2016 Negative  Negative mg/dL Final  . Bilirubin (Urine) 02/10/2016 Negative  Negative Final  . Ketones 02/10/2016 15  Negative mg/dL Final  . Specific Gravity, Urine  02/10/2016 1.010  1.003 - 1.035 Final  . Blood 02/10/2016 Negative  Negative Final  . pH 02/10/2016 6.0  4.6 - 8.0 Final    . Protein 02/10/2016 < 30  Negative- <30 mg/dL Final  . Urobilinogen, UR 02/10/2016 0.2  0.2 - 1 mg/dL Final  . Nitrite 02/10/2016 Negative  Negative Final  . Leukocyte Esterase 02/10/2016 Small  Negative Final  . RBC / HPF 02/10/2016 Negative  0 - 2 Final  . WBC, UA 02/10/2016 3-6  0 - 2 Final  . Bacteria, UA 02/10/2016 Trace  Negative- Trace Final  . Epithelial Cells 02/10/2016 Occasional  Negative- Few Final     RADIOGRAPHIC STUDIES: Ct Angio Chest Pe W/cm &/or Wo Cm  02/10/2016  CLINICAL DATA:  Fever, shortness or breath and cough February 07, 2016. Patient had right lung cancer diagnosed 2016. EXAM: CT ANGIOGRAPHY CHEST WITH CONTRAST TECHNIQUE: Multidetector CT imaging of the chest was performed using the standard protocol during bolus administration of intravenous contrast. Multiplanar CT image reconstructions and MIPs were obtained to evaluate the vascular anatomy. CONTRAST:  154m OMNIPAQUE IOHEXOL 350 MG/ML SOLN COMPARISON:  January 19, 2016 FINDINGS: There is no pulmonary embolus. There is atherosclerosis of the aorta without aneurysmal dilatation. Normal enhancement of the great vessels are noted. The heart size is mildly enlarged. There is minimal pericardial effusion. There are mild enlarge lymph nodes in the mediastinum and in the right hilum unchanged compared to prior exam. There are small bilateral pleural effusions, right is stable, left is larger compared to prior CT. There is right pleural thickening unchanged compared prior exam. There are diffuse innumerable nodules in bilateral lungs consistent with patient's known lung cancer. There are areas of consolidation some with air bronchograms involving the right upper, right middle, right lower lobe not significantly changed compared to the previous CT. Underlying mass is difficult to discretely separate from the consolidation. There are degenerative joint changes of the spine. No definite focal discrete lytic or blastic lesion is  identified. Review of the MIP images confirms the above findings. IMPRESSION: No pulmonary embolus. Changes of lung cancer stable compared to prior CT of January 19, 2016. There are areas of consolidation involving the right lung, difficult to discretely separate underlying mass from the consolidation, but this is unchanged compared to prior CT. There are bilateral pleural effusions, the left pleural effusion is slightly larger compared to prior CT. Electronically Signed   By: WAbelardo DieselM.D.   On: 02/10/2016 13:50    ASSESSMENT/PLAN:    UTI (urinary tract infection) Patient states that she has been experiencing frequency of urination since beginning of this week.  She denies any dysuria or hematuria.  She has been experiencing some chronic nausea; but no vomiting; which she is associating with her most recent cycle of chemotherapy.  She notes she had a fever to maximum 100.5 intermittently since this past Monday, 02/07/2016.  Urinalysis obtained today revealed no hematuria, negative nitrites, small amount of leukocyte  esterase, WBCs 3-6, and trace bacteria.  Urine culture results pending.  Blood pressure stable at 123/69, pulse increased to 122, temperature 98.5 today. _____________________________________________________________________________  Patient will be prescribed Cipro 500 mg twice daily to take for treatment of urinary tract infection.  Culture results are pending.  Malignant neoplasm of right upper lobe of lung ([facility] Patient is status post carboplatin/Alimta chemotherapy regimen 6 cycles completed on 12/30/2015.  Restaging scan obtained on 01/19/2016 of the chest/abdomen/pelvis was stable.  Patient initiated single agent maintenance Alimta chemotherapy  with her first cycle on 02/03/2016.  Patient is scheduled for labs, visit, and chemotherapy again on 02/24/2016.  Hypoalbuminemia due to protein-calorie malnutrition ([facility]) Albumin has decreased from 3.1 down to 2.6.  Patient  states she's had minimal to no appetite this week.  Patient was encouraged to eat multiple small meals throughout the day; and to push protein.  Dyspnea Patient is status post 6 cycles of carboplatin/Alimta chemotherapy completed on 12/30/2015.  She underwent a restaging CT of the chest/abdomen/pelvis on 01/19/2016; which showed stability of her disease.  She initiated single agent maintenance Alimta on 02/03/2016.  Patient reported to the [facility] today with complaint of increased fatigue and generalized weakness.  She states that she has been too weak to get out of bed for the last 3 days.  She reports she's had some frequency of urination; but denies any dysuria or hematuria.  She has had a dry cough; but no URI symptoms.  She feels slightly short of breath; and does complain of pain with inspiration.  She has been suffering with some chronic nausea; but no vomiting.  She also reports that she has been constipated for approximately one week.  She has had a fever to maximum 100.5 with chills.  Patient appears fatigued and increasingly weak on exam.  She was resting her head on the exam table when I arrived for the exam.  Breath sounds bilaterally were essentially clear; but with decreased air movement.  There was no wheezing or coughing noted.  Patient does appear slightly short of breath.  O2 sat has decreased down to 92% on room air.  Given patient's lower O2 sats, elevated pulse rate, a plane of pain with inspiration, and history of recent fever-we will order a CT angiogram of the chest to rule out pulmonary embolism.  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  Blood counts obtained today were essentially normal.  Patient does appear to have a mild urinary tract infection.  Urine culture results pending.  Also obtain blood cultures 2 which were pending results as  well. _________________________________________________________  CT angiogram of the chest obtained earlier today was negative for pulmonary embolism.  Lung cancer appears stable compared to prior CT of 01/19/2016.  There were noted.  Bilateral pleural effusions with a left pleural effusion, slightly larger when compared to prior CT.  Reviewed the scan results with both patient and her husband in detail.  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  Vital sign recheck revealed O2 sat 94-95% on room air.  Patient and her husband were advised to call/return ago directly to the emergency department for any worsening symptoms whatsoever.  Advised both patient and her husband may require a repeat thoracentesis or placement of a Pleurx catheter if pleural effusions.  Progress and cause increased dyspnea or discomfort.  Both patient and her husband stated understanding of all instructions.    Dehydration Patient has had minimal appetite within this past week.  She states that she has been pushing fluids fairly well though.  Patient does appear dehydrated today; with sodium level down to 135.  Patient will receive 1 L normal saline IV fluid rehydration while at the cancer Center today.  She was also encouraged to push fluids at home.  Constipation She states that she has been suffering with constipation for  approximately one week.  She states that she has been drinking prune juice with minimal effectiveness.  She states she avoids stool softeners and laxatives; because they cause her abdomen to cramp.  Advised patient that she would need to proceed with stool softeners and/or laxatives.  It is typical for laxatives to call some abdominal cramping; the patient should proceed with the stool softeners and laxatives as directed.  To prevent any further issues with constipation.  Chemotherapy-induced  nausea Patient reports chronic nausea for the past few days.  Patient feels that her nausea is secondary to her recent chemotherapy.  She denies any vomiting.  She has had minimal appetite and poor oral intake recently.  She appears dehydrated today.  Confirmed the patient does have anti--nausea medications already at home.  Patient will receive IV fluid rehydration while at the cancer Center today.   Patient stated understanding of all instructions; and was in agreement with this plan of care. The patient knows to call the clinic with any problems, questions or concerns.   Review/collaboration with Dr. Julien Nordmann regarding all aspects of patient's visit today.   Total time spent with patient was 40 minutes;  with greater than 75 percent of that time spent in face to face counseling regarding patient's symptoms,  and coordination of care and follow up.  Disclaimer:This dictation was prepared with Dragon/digital dictation along with [employer]. Any transcriptional errors that result from this process are unintentional.  Drue Second, NP 02/10/2016

## 2016-02-11 ENCOUNTER — Other Ambulatory Visit: Payer: Self-pay

## 2016-02-11 ENCOUNTER — Telehealth: Payer: Self-pay | Admitting: Medical Oncology

## 2016-02-11 ENCOUNTER — Encounter (HOSPITAL_COMMUNITY): Payer: Self-pay | Admitting: Emergency Medicine

## 2016-02-11 ENCOUNTER — Inpatient Hospital Stay (HOSPITAL_COMMUNITY)
Admission: EM | Admit: 2016-02-11 | Discharge: 2016-02-16 | DRG: 193 | Disposition: A | Discharge status: Home / Self Care | Payer: Medicare Other | Attending: Internal Medicine | Admitting: Internal Medicine

## 2016-02-11 ENCOUNTER — Telehealth: Payer: Self-pay | Admitting: Nurse Practitioner

## 2016-02-11 ENCOUNTER — Emergency Department (HOSPITAL_COMMUNITY): Payer: Medicare Other

## 2016-02-11 ENCOUNTER — Ambulatory Visit (HOSPITAL_BASED_OUTPATIENT_CLINIC_OR_DEPARTMENT_OTHER): Payer: Medicare Other | Admitting: Nurse Practitioner

## 2016-02-11 VITALS — BP 99/64 | HR 116 | Temp 97.9°F | Resp 17 | Ht 69.0 in | Wt 130.6 lb

## 2016-02-11 DIAGNOSIS — E8770 Fluid overload, unspecified: Secondary | ICD-10-CM | POA: Diagnosis not present

## 2016-02-11 DIAGNOSIS — C3491 Malignant neoplasm of unspecified part of right bronchus or lung: Secondary | ICD-10-CM | POA: Diagnosis present

## 2016-02-11 DIAGNOSIS — Z8249 Family history of ischemic heart disease and other diseases of the circulatory system: Secondary | ICD-10-CM | POA: Diagnosis not present

## 2016-02-11 DIAGNOSIS — J9601 Acute respiratory failure with hypoxia: Secondary | ICD-10-CM | POA: Diagnosis present

## 2016-02-11 DIAGNOSIS — J910 Malignant pleural effusion: Secondary | ICD-10-CM | POA: Diagnosis not present

## 2016-02-11 DIAGNOSIS — J9621 Acute and chronic respiratory failure with hypoxia: Secondary | ICD-10-CM | POA: Diagnosis not present

## 2016-02-11 DIAGNOSIS — R079 Chest pain, unspecified: Secondary | ICD-10-CM

## 2016-02-11 DIAGNOSIS — R110 Nausea: Secondary | ICD-10-CM | POA: Diagnosis not present

## 2016-02-11 DIAGNOSIS — E860 Dehydration: Secondary | ICD-10-CM | POA: Diagnosis not present

## 2016-02-11 DIAGNOSIS — Z90710 Acquired absence of both cervix and uterus: Secondary | ICD-10-CM

## 2016-02-11 DIAGNOSIS — Z853 Personal history of malignant neoplasm of breast: Secondary | ICD-10-CM | POA: Diagnosis not present

## 2016-02-11 DIAGNOSIS — D61810 Antineoplastic chemotherapy induced pancytopenia: Secondary | ICD-10-CM | POA: Diagnosis present

## 2016-02-11 DIAGNOSIS — E46 Unspecified protein-calorie malnutrition: Secondary | ICD-10-CM

## 2016-02-11 DIAGNOSIS — Z681 Body mass index (BMI) 19 or less, adult: Secondary | ICD-10-CM

## 2016-02-11 DIAGNOSIS — E8809 Other disorders of plasma-protein metabolism, not elsewhere classified: Secondary | ICD-10-CM | POA: Diagnosis not present

## 2016-02-11 DIAGNOSIS — Z79899 Other long term (current) drug therapy: Secondary | ICD-10-CM

## 2016-02-11 DIAGNOSIS — R0602 Shortness of breath: Secondary | ICD-10-CM | POA: Diagnosis not present

## 2016-02-11 DIAGNOSIS — Y95 Nosocomial condition: Secondary | ICD-10-CM | POA: Diagnosis present

## 2016-02-11 DIAGNOSIS — T451X5A Adverse effect of antineoplastic and immunosuppressive drugs, initial encounter: Secondary | ICD-10-CM | POA: Diagnosis not present

## 2016-02-11 DIAGNOSIS — R0600 Dyspnea, unspecified: Secondary | ICD-10-CM | POA: Diagnosis not present

## 2016-02-11 DIAGNOSIS — N390 Urinary tract infection, site not specified: Secondary | ICD-10-CM | POA: Diagnosis not present

## 2016-02-11 DIAGNOSIS — R319 Hematuria, unspecified: Principal | ICD-10-CM

## 2016-02-11 DIAGNOSIS — M8580 Other specified disorders of bone density and structure, unspecified site: Secondary | ICD-10-CM | POA: Diagnosis not present

## 2016-02-11 DIAGNOSIS — J90 Pleural effusion, not elsewhere classified: Secondary | ICD-10-CM | POA: Diagnosis not present

## 2016-02-11 DIAGNOSIS — J189 Pneumonia, unspecified organism: Principal | ICD-10-CM | POA: Diagnosis present

## 2016-02-11 LAB — BASIC METABOLIC PANEL
ANION GAP: 13 (ref 5–15)
BUN: 11 mg/dL (ref 6–20)
CO2: 22 mmol/L (ref 22–32)
Calcium: 8.8 mg/dL — ABNORMAL LOW (ref 8.9–10.3)
Chloride: 104 mmol/L (ref 101–111)
Creatinine, Ser: 0.68 mg/dL (ref 0.44–1.00)
GFR calc Af Amer: >60 mL/min (ref >60)
GLUCOSE: 82 mg/dL (ref 65–99)
POTASSIUM: 3.7 mmol/L (ref 3.5–5.1)
Sodium: 139 mmol/L (ref 135–145)

## 2016-02-11 LAB — I-STAT CG4 LACTIC ACID, ED: Lactic Acid, Venous: 1.37 mmol/L (ref 0.5–2.0)

## 2016-02-11 LAB — CBC
HEMATOCRIT: 30.1 % — AB (ref 36.0–46.0)
HEMOGLOBIN: 10.1 g/dL — AB (ref 12.0–15.0)
MCH: 30.3 pg (ref 26.0–34.0)
MCHC: 33.6 g/dL (ref 30.0–36.0)
MCV: 90.4 fL (ref 78.0–100.0)
Platelets: 116 10*3/uL — ABNORMAL LOW (ref 150–400)
RBC: 3.33 MIL/uL — ABNORMAL LOW (ref 3.87–5.11)
RDW: 13.7 % (ref 11.5–15.5)
WBC: 3 10*3/uL — ABNORMAL LOW (ref 4.0–10.5)

## 2016-02-11 LAB — I-STAT TROPONIN, ED: Troponin i, poc: 0 ng/mL (ref 0.00–0.08)

## 2016-02-11 LAB — BRAIN NATRIURETIC PEPTIDE: B Natriuretic Peptide: 15.3 pg/mL (ref 0.0–100.0)

## 2016-02-11 LAB — URINE CULTURE: ORGANISM ID, BACTERIA: NO GROWTH

## 2016-02-11 LAB — TROPONIN I

## 2016-02-11 MED ORDER — HYDROCODONE-ACETAMINOPHEN 5-325 MG PO TABS
1.0000 | ORAL_TABLET | Freq: Four times a day (QID) | ORAL | Status: DC | PRN
  Administered 2016-02-12: 1 via ORAL
  Administered 2016-02-13 – 2016-02-14 (×2): 2 via ORAL
  Administered 2016-02-15: 1 via ORAL
  Filled 2016-02-11: qty 1
  Filled 2016-02-11 (×2): qty 2
  Filled 2016-02-11: qty 1

## 2016-02-11 MED ORDER — DEXTROSE 5 % IV SOLN
1.0000 g | Freq: Three times a day (TID) | INTRAVENOUS | Status: DC
  Administered 2016-02-11 – 2016-02-14 (×8): 1 g via INTRAVENOUS
  Filled 2016-02-11 (×9): qty 1

## 2016-02-11 MED ORDER — ENOXAPARIN SODIUM 40 MG/0.4ML ~~LOC~~ SOLN
40.0000 mg | SUBCUTANEOUS | Status: DC
  Administered 2016-02-12 – 2016-02-15 (×5): 40 mg via SUBCUTANEOUS
  Filled 2016-02-11 (×5): qty 0.4

## 2016-02-11 MED ORDER — HYDROCODONE-HOMATROPINE 5-1.5 MG/5ML PO SYRP
5.0000 mL | ORAL_SOLUTION | Freq: Four times a day (QID) | ORAL | Status: DC | PRN
  Administered 2016-02-12: 5 mL via ORAL
  Filled 2016-02-11: qty 5

## 2016-02-11 MED ORDER — ONDANSETRON HCL 4 MG/2ML IJ SOLN
4.0000 mg | Freq: Four times a day (QID) | INTRAMUSCULAR | Status: DC | PRN
  Administered 2016-02-12 – 2016-02-14 (×3): 4 mg via INTRAVENOUS
  Filled 2016-02-11 (×3): qty 2

## 2016-02-11 MED ORDER — IPRATROPIUM-ALBUTEROL 0.5-2.5 (3) MG/3ML IN SOLN
3.0000 mL | Freq: Four times a day (QID) | RESPIRATORY_TRACT | Status: DC
  Administered 2016-02-11: 3 mL via RESPIRATORY_TRACT
  Filled 2016-02-11: qty 3

## 2016-02-11 MED ORDER — ONDANSETRON HCL 4 MG/2ML IJ SOLN: 4.0000 mg | Freq: Four times a day (QID) | INTRAMUSCULAR | Status: DC | PRN

## 2016-02-11 MED ORDER — LEVOTHYROXINE SODIUM 25 MCG PO TABS
75.0000 ug | ORAL_TABLET | Freq: Every day | ORAL | Status: DC
  Administered 2016-02-12 – 2016-02-16 (×5): 75 ug via ORAL
  Filled 2016-02-11 (×5): qty 1

## 2016-02-11 MED ORDER — LORAZEPAM 2 MG/ML IJ SOLN: 0.5000 mg | INTRAMUSCULAR | Status: DC | PRN

## 2016-02-11 MED ORDER — SODIUM CHLORIDE 0.45 % IV SOLN
INTRAVENOUS | Status: DC
  Administered 2016-02-11 – 2016-02-14 (×5): via INTRAVENOUS

## 2016-02-11 MED ORDER — VANCOMYCIN HCL IN DEXTROSE 750-5 MG/150ML-% IV SOLN
750.0000 mg | Freq: Two times a day (BID) | INTRAVENOUS | Status: DC
  Administered 2016-02-11 – 2016-02-14 (×6): 750 mg via INTRAVENOUS
  Filled 2016-02-11 (×6): qty 150

## 2016-02-11 MED ORDER — SODIUM CHLORIDE 0.9 % IV SOLN
8.0000 mg | Freq: Four times a day (QID) | INTRAVENOUS | Status: DC | PRN
  Filled 2016-02-11: qty 4

## 2016-02-11 MED ORDER — ENSURE ENLIVE PO LIQD
237.0000 mL | Freq: Two times a day (BID) | ORAL | Status: DC
  Administered 2016-02-12 – 2016-02-16 (×6): 237 mL via ORAL

## 2016-02-11 MED ORDER — FOLIC ACID 1 MG PO TABS
1.0000 mg | ORAL_TABLET | Freq: Every day | ORAL | Status: DC
  Administered 2016-02-12 – 2016-02-16 (×6): 1 mg via ORAL
  Filled 2016-02-11 (×6): qty 1

## 2016-02-11 MED ORDER — LORAZEPAM 0.5 MG PO TABS
0.5000 mg | ORAL_TABLET | Freq: Four times a day (QID) | ORAL | Status: DC | PRN
  Administered 2016-02-12 – 2016-02-15 (×3): 0.5 mg via ORAL
  Filled 2016-02-11 (×3): qty 1

## 2016-02-11 NOTE — Telephone Encounter (Addendum)
F/u call from pt. She came downstairs and had some water and miralax. She took her antibiotic,  ate a few peaches. She feels no worse and may ' feel a little better'. She sounds SOB over the phone and admits that she is . Her husband is out of town so she would have to call her daughter ( a Education officer, museum )  to bring her in for [facility]. I called daughter and left message for her to return my call about bringing her mother in today . She does not need labs per [employer].I talked to husband and told him about appt needed and told him to shoot for 230 appt.He will call me back.

## 2016-02-11 NOTE — H&P (Deleted)
[facility] History and Physical  MERRIN MCVICKER STM:[id number] DOB: [birth year] DOA: 02/11/2016  Referring physician: Dr. Zenia Resides PCP: [name], MD   Chief Complaint: fevers, chills, nausea  HPI: Deborah [name] is a 70 y.o. 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  Has completed 2-agent systemic chemoRx from Sept '16 > Jan '17.  Started on single agent Rx with Alimta on 02/03/16.  Now presents with severe fatigue and gen weakness. Unable to get OOB.  NO dysuria, some dry cough.  NO URI symptoms.  Slight SOB, +pain w inspiration. Chron nausea, no emesis. Constipated x 1 week. +fever to 100.5 max at home. Seen by Onc NP today and sent for CTA of chest which showed no PE, small bilat effusions, diffuse basilar lung nodules; areas of consolidation w some air bronchograms in RUL/ RML and RLL but not sig changed from prior.    In ED on 2L O2 sats are 91-93 %.  BP 108/60, RR 16, temp 98.5  HR 105.    Na 139  K 3.7  CL 104  CO2 22  BUN 11 Creat 0.68  Ca 8.8  . WBC 3.0 (down)  Hb 10.1  plt 116.     LFT's done yesterday showed alb 2.6, AST / ALT essentially wnl.  Tbili 1.14.     Chart review: Onc visit / Sept 2016 - hx BRCA 20 yrs ago s/p lumpect/ XRT (no chemo/hormonal Rx) developed cough/ SOB aug 2016. CXR abnormal, rx for PNA, then had chest CT showing masslike area RUL and multiple pulm nodules in the lower lobes.  08/24/15 had thoracentesis w cytology showing +malignant cells c/w metastatic adenocarcinoma.  Lung primary was suspected based on immunohistochemistry.  SEnt to [facility] where dx was stage IV (T1b, N2,  M1) non-small cell lung cancer/ adenoCa presenting w RUL lung mass, med LAN and malig pleural effusion / innumerable bilat pulm nodules.  Rx'd with systemic chemoRx with carboplatin/ Alimta every 3 weeks from late Sept '16 thru Jan 2017.    ROS  denies prod cough, wheezing  no abd pain   chron nausea,no emesis  no jotinpain or rash  no HA  nosore throat  Where does patient live home Can patient participate in ADLs? yes  Past Medical History  Past Medical History  Diagnosis Date  . Allergy     Mold  . Hyperlipidemia 08/04/2011  . Hypothyroidism 08/04/2011  . Atrophic vaginitis   . Osteopenia 10/2012    T score -1.7 FRAX 7%/0.3%  . Vertigo   . Shortness of breath dyspnea   . Meniere's disease   . Encounter for antineoplastic chemotherapy 10/28/2015  . Cancer ([place]) 1999    STATUS POST RIGHT LUMPECTOMY WITH RADIATION FOR DUCTAL CA IN SITU  . Basal cell carcinoma of skin 2010  . Non-small cell lung cancer [facility]) September 2016    Adenocarcinoma  . Malignant neoplasm of right upper lobe of lung ([place]) 08/26/2015   Past Surgical History  Past Surgical History  Procedure Laterality Date  . Tonsillectomy  1952  . Vaginal hysterectomy  1980  . Breast biopsy  1999    RADIATION TREATMENTS  . Breast lumpectomy Right 1998    right  . Chest tube insertion Right 09/15/2015    Procedure: INSERTION RIGHT PLEURAL DRAINAGE CATHETER;  Surgeon: Melrose Nakayama, MD;  Location: [place];  Service: Thoracic;  Laterality: Right;  Family History  Family History  Problem Relation Age of Onset  . Dementia Mother   . Breast cancer Mother     Breast Cancer  . Heart disease Other     Grandparents  . Heart disease Father   . Diabetes Daughter     TYPE 2  . Melanoma Mother    Social History  reports that she has never smoked. She has never used smokeless tobacco. She reports that she drinks about 1.2 oz of alcohol per week. She reports that she does not use illicit drugs. Allergies No Known Allergies Home medications Prior to Admission medications   Medication Sig Start Date End Date Taking? Authorizing Provider  albuterol (PROVENTIL HFA;VENTOLIN HFA) 108 (90 BASE) MCG/ACT inhaler Inhale 2 puffs into the lungs every 6 (six) hours as needed for wheezing or shortness of breath.  07/28/15  Yes Biagio Borg, MD  ciprofloxacin (CIPRO) 500 MG tablet Take 1 tablet (500 mg total) by mouth 2 (two) times daily. 02/10/16  Yes Susanne Borders, NP  dexamethasone (DECADRON) 4 MG tablet 4 mg po bid the day before, day of and day after chemo 09/10/15  Yes Curt Bears, MD  folic acid (FOLVITE) 1 MG tablet Take 1 tablet (1 mg total) by mouth daily. 09/10/15  Yes Curt Bears, MD  HYDROcodone-homatropine [facility]) 5-1.5 MG/5ML syrup Take 5 mLs by mouth every 6 (six) hours as needed for cough. 09/27/15  Yes Curt Bears, MD  levothyroxine (SYNTHROID, LEVOTHROID) 75 MCG tablet TAKE 1 TABLET BY MOUTH DAILY. 08/06/14  Yes Biagio Borg, MD  LORazepam (ATIVAN) 0.5 MG tablet Take one every eight hours as needed for anxiety.  Prefers one at bedtime. 01/04/16  Yes Curt Bears, MD  ondansetron (ZOFRAN) 8 MG tablet Take 1 tablet (8 mg total) by mouth every 8 (eight) hours as needed for nausea or vomiting. 11/02/15  Yes Curt Bears, MD  ondansetron (ZOFRAN) 8 MG tablet TAKE 1 TABLET BY MOUTH EVERY 8 HOURS AS NEEDED FOR NAUSEA OR VOMITING 01/03/16  Yes Curt Bears, MD  triamterene-hydrochlorothiazide (DYAZIDE) 37.5-25 MG per capsule Take 1 capsule by mouth daily as needed. Reported on 02/10/2016 08/04/15  Yes Historical Provider, MD  prochlorperazine (COMPAZINE) 10 MG tablet Take 1 tablet (10 mg total) by mouth every 6 (six) hours as needed for nausea or vomiting. Patient not taking: Reported on 02/10/2016 09/10/15   Curt Bears, MD   Liver Function Tests  Recent Labs Lab 02/10/16 1103  AST 38*  ALT 10  ALKPHOS 114  BILITOT 1.14  PROT 6.0*  ALBUMIN 2.6*   No results for input(s): LIPASE, AMYLASE in the last 168 hours. CBC  Recent Labs Lab 02/10/16 1102 02/11/16 1638  WBC 4.2 3.0*  NEUTROABS 2.4  --   HGB 11.8 10.1*  HCT 35.1 30.1*  MCV 90.8 90.4  PLT 149 226*   Basic Metabolic Panel  Recent Labs Lab 02/10/16 1103 02/11/16 1638  NA 135* 139  K 4.1 3.7  CL  --   104  CO2 25 22  GLUCOSE 97 82  BUN 11.5 11  CREATININE 0.7 0.68  CALCIUM 9.1 8.8*     Filed Vitals:   02/11/16 1617 02/11/16 1830 02/11/16 1831  BP: 119/77 108/60 108/60  Pulse: 106  105  Temp: 98.7 F (37.1 C)  98.5 F (36.9 C)  TempSrc: Oral  Oral  Resp: '18 30 16  '$ SpO2: 93%  93%   Exam: Alert, mild-mod dyspnea,no use of acc muscles No rash, cyanosis  or gangrene Sclera anicteric, throat clear, TM's clear bilat No jvd, flat neck veins, no LAN Chest scattered rales bilat bases,nowheezing RRR no MRG, tachy Abd soft ntnd nomass or ascites no hsm +bs GU defer MSno joint effusion/ muscle tone is good Ext no edema, wounds or ulcers Neuro is alert , Ox 3   CXR (independently reviewed) > 2/24 stable appearance of lung carcinoma w metastatic disease bilat.  No acute change CT chest > no PE. Small bilat pleural effusions, R is stable and L is larger than 2/1 study.  No change bilat basilar pulm nodules.  Areas of consolidation RML/RUL/RLL read as no different than 2/1 study.    Assessment: 1 Chest pain/ dyspnea - borderline hypoxemia.  L sided CP /pleuritic may be from new L malig effusion. The effusion is not very large though,not sure if it could be drained. Has had prior R malig effusion drained.  CT suggestive of consolidation in R chest, some of this may be tumor but w fevers at home and increased WOB will plan to give empiric abx for possible PNA.  2 NSCCa of lung/non-squamous - sp carbo/ Alimta for 3.5 months, just switched over to single agent Rx with Alimta (pemetrexed, antimetabolite).  3 Pancytopenia - ? Due to chemo. First dose of maint chemo w Alimta was on 2/16, 12 days ago 4 Hx remote breast cancer - 20 yrs ago  5 Vol depletion - looks dry on exam, tachy and hasn't been eating well d/t nausea  Plan - admit, IV abx for poss PNA, IVF's.     DVT Prophylaxis lovenox Code Status: full  Family Communication: husband at bedside  Disposition Plan: dc to home when  stable    [place] D [facility] Pager [phone]  Cell [phone]  If 7PM-7AM, please contact night-coverage [email or web address] Password [facility] 02/11/2016, 7:07 PM

## 2016-02-11 NOTE — Telephone Encounter (Signed)
I spoke to pt . She stated she is no worse , still doesn't feel good , no energy , still in bed. She denies nausea, drank some Orange juice , sips of water and may soon eat a bagle. She slept most of night and woke up 'naturally"  at 530 , felt like she had a fever and had a headache. She took one aspirin and at 0630 had sweated through bed clothes. . She started cipro last night . I instructed her to call me back at 1230 with an update and call sooner if she starts feeling worse.

## 2016-02-11 NOTE — Telephone Encounter (Signed)
Per 02/24 POF, added pt to NP/CB schedule at 2:30 pt is aware... KJ

## 2016-02-11 NOTE — ED Provider Notes (Signed)
CSN: [id number]     Arrival date & time 02/11/16  1604 History   First MD Initiated Contact with Patient 02/11/16 1613     Chief Complaint  Patient presents with  . Shortness of Breath     (Consider location/radiation/quality/duration/timing/severity/associated sxs/prior Treatment) HPI Comments: Pt here c/o sob x 2 days--seen at cancer center x 2 for same and had a chest ct that was neg for pe yesterday but did show an increased left sided pleural effusion--no vomiting or diarrhea--low grade temp all week and last chemo tx was a week ago--pt on abx for uti Also notes left sided chest pain that's sharp and pin point and worse with movement--no associated diaphoresis, nausea-- No recent leg pain or swelling Was at cancer center today and sent here for further w/u  Patient is a 70 y.o. female presenting with shortness of breath. The history is provided by the patient and a relative.  Shortness of Breath   Past Medical History  Diagnosis Date  . Allergy     Mold  . Hyperlipidemia 08/04/2011  . Hypothyroidism 08/04/2011  . Atrophic vaginitis   . Osteopenia 10/2012    T score -1.7 FRAX 7%/0.3%  . Vertigo   . Shortness of breath dyspnea   . Meniere's disease   . Encounter for antineoplastic chemotherapy 10/28/2015  . Cancer ([place]) 1999    STATUS POST RIGHT LUMPECTOMY WITH RADIATION FOR DUCTAL CA IN SITU  . Basal cell carcinoma of skin 2010  . Non-small cell lung cancer [facility]) September 2016    Adenocarcinoma  . Malignant neoplasm of right upper lobe of lung ([place]) 08/26/2015   Past Surgical History  Procedure Laterality Date  . Tonsillectomy  1952  . Vaginal hysterectomy  1980  . Breast biopsy  1999    RADIATION TREATMENTS  . Breast lumpectomy Right 1998    right  . Chest tube insertion Right 09/15/2015    Procedure: INSERTION RIGHT PLEURAL DRAINAGE CATHETER;  Surgeon: Melrose Nakayama, MD;  Location: [place];  Service: Thoracic;  Laterality: Right;   Family History  Problem  Relation Age of Onset  . Dementia Mother   . Breast cancer Mother     Breast Cancer  . Heart disease Other     Grandparents  . Heart disease Father   . Diabetes Daughter     TYPE 2  . Melanoma Mother    Social History  Substance Use Topics  . Smoking status: Never Smoker   . Smokeless tobacco: Never Used  . Alcohol Use: 1.2 oz/week    2 Glasses of wine per week     Comment: 1 glass wine per wk at times   OB History    Gravida Para Term Preterm AB TAB SAB Ectopic Multiple Living   '4 3  1      3     '$ Review of Systems  Respiratory: Positive for shortness of breath.   All other systems reviewed and are negative.     Allergies  Review of patient's allergies indicates no known allergies.  Home Medications   Prior to Admission medications   Medication Sig Start Date End Date Taking? Authorizing Provider  albuterol (PROVENTIL HFA;VENTOLIN HFA) 108 (90 BASE) MCG/ACT inhaler Inhale 2 puffs into the lungs every 6 (six) hours as needed for wheezing or shortness of breath. Patient not taking: Reported on 02/10/2016 07/28/15   Biagio Borg, MD  Cholecalciferol (VITAMIN D-3) 1000 UNITS CAPS Take 1 capsule by mouth daily.  Historical Provider, MD  ciprofloxacin (CIPRO) 500 MG tablet Take 1 tablet (500 mg total) by mouth 2 (two) times daily. 02/10/16   Susanne Borders, NP  dexamethasone (DECADRON) 4 MG tablet 4 mg po bid the day before, day of and day after chemo Patient not taking: Reported on 02/10/2016 09/10/15   Curt Bears, MD  folic acid (FOLVITE) 1 MG tablet Take 1 tablet (1 mg total) by mouth daily. 09/10/15   Curt Bears, MD  HYDROcodone-homatropine [facility]) 5-1.5 MG/5ML syrup Take 5 mLs by mouth every 6 (six) hours as needed for cough. 09/27/15   Curt Bears, MD  levothyroxine (SYNTHROID, LEVOTHROID) 75 MCG tablet TAKE 1 TABLET BY MOUTH DAILY. 08/06/14   Biagio Borg, MD  LORazepam (ATIVAN) 0.5 MG tablet Take one every eight hours as needed for anxiety.  Prefers one  at bedtime. 01/04/16   Curt Bears, MD  ondansetron (ZOFRAN) 8 MG tablet Take 1 tablet (8 mg total) by mouth every 8 (eight) hours as needed for nausea or vomiting. 11/02/15   Curt Bears, MD  ondansetron (ZOFRAN) 8 MG tablet TAKE 1 TABLET BY MOUTH EVERY 8 HOURS AS NEEDED FOR NAUSEA OR VOMITING 01/03/16   Curt Bears, MD  prochlorperazine (COMPAZINE) 10 MG tablet Take 1 tablet (10 mg total) by mouth every 6 (six) hours as needed for nausea or vomiting. Patient not taking: Reported on 02/10/2016 09/10/15   Curt Bears, MD  triamterene-hydrochlorothiazide (DYAZIDE) 37.5-25 MG per capsule Take 1 capsule by mouth daily as needed. Reported on 02/10/2016 08/04/15   Historical Provider, MD   BP 119/77 mmHg  Pulse 106  Temp(Src) 98.7 F (37.1 C) (Oral)  Resp 18  SpO2 93%  LMP 10/11/1979 Physical Exam  Constitutional: She is oriented to person, place, and time. She appears well-developed and well-nourished.  Non-toxic appearance. No distress.  HENT:  Head: Normocephalic and atraumatic.  Eyes: Conjunctivae, EOM and lids are normal. Pupils are equal, round, and reactive to light.  Neck: Normal range of motion. Neck supple. No tracheal deviation present. No thyroid mass present.  Cardiovascular: Regular rhythm and normal heart sounds.  Tachycardia present.  Exam reveals no gallop.   No murmur heard. Pulmonary/Chest: Effort normal and breath sounds normal. No stridor. No respiratory distress. She has no decreased breath sounds. She has no wheezes. She has no rhonchi. She has no rales.    Abdominal: Soft. Normal appearance and bowel sounds are normal. She exhibits no distension. There is no tenderness. There is no rebound and no CVA tenderness.  Musculoskeletal: Normal range of motion. She exhibits no edema or tenderness.  Neurological: She is alert and oriented to person, place, and time. She has normal strength. No cranial nerve deficit or sensory deficit. GCS eye subscore is 4. GCS verbal  subscore is 5. GCS motor subscore is 6.  Skin: Skin is warm and dry. No abrasion and no rash noted.  Psychiatric: She has a normal mood and affect. Her speech is normal and behavior is normal.  Nursing note and vitals reviewed.   ED Course  Procedures (including critical care time) Labs Review Labs Reviewed  CULTURE, BLOOD (ROUTINE X 2)  CULTURE, BLOOD (ROUTINE X 2)  BASIC METABOLIC PANEL  CBC  TROPONIN I  BRAIN NATRIURETIC PEPTIDE  I-STAT TROPOININ, ED  I-STAT CG4 LACTIC ACID, ED    Imaging Review Ct Angio Chest Pe W/cm &/or Wo Cm  02/10/2016  CLINICAL DATA:  Fever, shortness or breath and cough February 07, 2016. Patient had right  lung cancer diagnosed 2016. EXAM: CT ANGIOGRAPHY CHEST WITH CONTRAST TECHNIQUE: Multidetector CT imaging of the chest was performed using the standard protocol during bolus administration of intravenous contrast. Multiplanar CT image reconstructions and MIPs were obtained to evaluate the vascular anatomy. CONTRAST:  156m OMNIPAQUE IOHEXOL 350 MG/ML SOLN COMPARISON:  January 19, 2016 FINDINGS: There is no pulmonary embolus. There is atherosclerosis of the aorta without aneurysmal dilatation. Normal enhancement of the great vessels are noted. The heart size is mildly enlarged. There is minimal pericardial effusion. There are mild enlarge lymph nodes in the mediastinum and in the right hilum unchanged compared to prior exam. There are small bilateral pleural effusions, right is stable, left is larger compared to prior CT. There is right pleural thickening unchanged compared prior exam. There are diffuse innumerable nodules in bilateral lungs consistent with patient's known lung cancer. There are areas of consolidation some with air bronchograms involving the right upper, right middle, right lower lobe not significantly changed compared to the previous CT. Underlying mass is difficult to discretely separate from the consolidation. There are degenerative joint changes  of the spine. No definite focal discrete lytic or blastic lesion is identified. Review of the MIP images confirms the above findings. IMPRESSION: No pulmonary embolus. Changes of lung cancer stable compared to prior CT of January 19, 2016. There are areas of consolidation involving the right lung, difficult to discretely separate underlying mass from the consolidation, but this is unchanged compared to prior CT. There are bilateral pleural effusions, the left pleural effusion is slightly larger compared to prior CT. Electronically Signed   By: WAbelardo DieselM.D.   On: 02/10/2016 13:50   I have personally reviewed and evaluated these images and lab results as part of my medical decision-making.   EKG Interpretation None      MDM   Final diagnoses:  None    Patient's chest x-ray results noted. She continues to be requiring oxygen and also has mild tachycardia. Will be admitted for further evaluation    ALacretia Leigh MD 02/11/16 [phone]

## 2016-02-11 NOTE — ED Notes (Signed)
Per RN from cancer center-increased SOB since chemo on the 16 th-at appointment today-states chest pain that stated today-on antibiotics for UTI-had CTA yesterday and states pleural effusion stable

## 2016-02-11 NOTE — Progress Notes (Signed)
Pharmacy Antibiotic Note  Deborah [name] is a 70 y.o. female admitted on 02/11/2016 with pneumonia.   She has hx Lung Cancer and is currently on maintenance chemo regimen with Alimta.   Pharmacy has been consulted for Vancomycin dosing.  Plan: Vancomycin '750mg'$  IV every 12 hours.  Goal trough 15-20 mcg/mL. Continue Cefepime 1gm IV q8h  Monitor renal function and cx data  Check Vancomycin trough at steady state  Height: '5\' 9"'$  (175.3 cm) Weight: 136 lb 7.4 oz (61.9 kg) IBW/kg (Calculated) : 66.2  Temp (24hrs), Avg:98.3 F (36.8 C), Min:97.9 F (36.6 C), Max:98.7 F (37.1 C)   Recent Labs Lab 02/10/16 1102 02/10/16 1103 02/11/16 1638 02/11/16 1650  WBC 4.2  --  3.0*  --   CREATININE  --  0.7 0.68  --   LATICACIDVEN  --   --   --  1.37    Estimated Creatinine Clearance: 64.9 mL/min (by C-G formula based on Cr of 0.68).    No Known Allergies  Antimicrobials this admission: 2/24 Cefepime >>  2/24 Vanc >>   Dose adjustments this admission:  Microbiology results: 2/24 BCx: sent  Thank you for allowing pharmacy to be a part of this patient's care.  Biagio Borg 02/11/2016 9:07 PM

## 2016-02-11 NOTE — ED Notes (Signed)
Bed: WH87 Expected date:  Expected time:  Means of arrival:  Comments: Pt from [facility]

## 2016-02-11 NOTE — ED Notes (Signed)
Pt being sent by [facility] c/o increased SOB x several days and L side chest pain starting to day.  Hx of Lung CA and bilateral pleural effusions.  Pt had CT Angio yesterday which resulted increase of pleural effusions.  Last chemo 2/16.

## 2016-02-12 ENCOUNTER — Encounter: Payer: Self-pay | Admitting: Nurse Practitioner

## 2016-02-12 DIAGNOSIS — R110 Nausea: Secondary | ICD-10-CM

## 2016-02-12 DIAGNOSIS — T451X5A Adverse effect of antineoplastic and immunosuppressive drugs, initial encounter: Secondary | ICD-10-CM

## 2016-02-12 DIAGNOSIS — Z853 Personal history of malignant neoplasm of breast: Secondary | ICD-10-CM

## 2016-02-12 LAB — STREP PNEUMONIAE URINARY ANTIGEN: Strep Pneumo Urinary Antigen: NEGATIVE

## 2016-02-12 MED ORDER — IPRATROPIUM-ALBUTEROL 0.5-2.5 (3) MG/3ML IN SOLN
3.0000 mL | Freq: Three times a day (TID) | RESPIRATORY_TRACT | Status: DC
  Administered 2016-02-12 – 2016-02-16 (×12): 3 mL via RESPIRATORY_TRACT
  Filled 2016-02-12 (×12): qty 3

## 2016-02-12 NOTE — Progress Notes (Signed)
[facility] PROGRESS NOTE   Deborah [name] PJA:[id number] DOB: 1946-05-20 DOA: 02/11/2016 PCP: [name], MD  HPI/Subjective: Seen with husband at bedside, no fever or chills overnight. Still has left-sided pleuritic chest pain with deep breath.  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 Patient has left-sided pleuritic chest pain with deep inspiration. Has mild hypoxia on admission, CT angiography done and showed no evidence of PE. Treat as HCAP was on cefepime and vancomycin. Supportive management with bronchodilators, antitussives, mucolytics and oxygen as needed. She is not on oxygen at home.  NSCLC Patient has non-small cell lung cancer, status post carboplatin and Alimta chemotherapy. Follows with Dr. Julien Nordmann as outpatient. CT did not show worsening/progression.  Pancytopenia This is likely secondary to chemotherapy, last maintenance chemotherapy patient received was on 12/02/2016. Follow total WBC and ANC counts, check CBC with differential in a.m.  Hx remote breast cancer , 20 yrs ago   Vol depletion - looks dry on exam, tachy and hasn't been eating well d/t nausea  Code Status: Full Code Family Communication: Plan discussed with the patient. Disposition Plan: Remains inpatient Diet: Diet Heart Room service appropriate?: Yes; Fluid consistency:: Thin  Consultants:  None  Procedures:  None  Antibiotics:  Cefepime and vancomycin.   Objective: Filed Vitals:   02/11/16 2058 02/12/16 0627  BP: 98/48 104/62  Pulse: 106 96    Temp: 97.9 F (36.6 C) 97.6 F (36.4 C)  Resp: 18 18    Intake/Output Summary (Last 24 hours) at 02/12/16 1226 Last data filed at 02/12/16 0900  Gross per 24 hour  Intake    240 ml  Output      2 ml  Net    238 ml   Filed Weights   02/11/16 2058  Weight: 60 kg (132 lb 4.4 oz)    Exam: General: Alert and awake, oriented x3, not in any acute distress. HEENT: anicteric sclera, pupils reactive to light and accommodation, EOMI CVS: S1-S2 clear, no murmur rubs or gallops Chest: clear to auscultation bilaterally, no wheezing, rales or rhonchi Abdomen: soft nontender, nondistended, normal bowel sounds, no organomegaly Extremities: no cyanosis, clubbing or edema noted bilaterally Neuro: Cranial nerves II-XII intact, no focal neurological deficits  Data Reviewed: Basic Metabolic Panel:  Recent Labs Lab 02/10/16 1103 02/11/16 1638  NA 135* 139  K 4.1 3.7  CL  --  104  CO2 25 22  GLUCOSE 97 82  BUN 11.5 11  CREATININE 0.7 0.68  CALCIUM 9.1 8.8*   Liver Function Tests:  Recent Labs Lab 02/10/16 1103  AST 38*  ALT 10  ALKPHOS 114  BILITOT 1.14  PROT 6.0*  ALBUMIN 2.6*   No results for input(s): LIPASE, AMYLASE in the last 168 hours. No results for input(s): AMMONIA in the last 168 hours. CBC:  Recent Labs Lab 02/10/16 1102 02/11/16 1638  WBC 4.2 3.0*  NEUTROABS 2.4  --   HGB 11.8 10.1*  HCT 35.1 30.1*  MCV 90.8 90.4  PLT 149 116*   Cardiac Enzymes:  Recent Labs Lab 02/11/16 1638  TROPONINI <0.03   BNP (last  3 results)  Recent Labs  02/11/16 1638  BNP 15.3    ProBNP (last 3 results) No results for input(s): PROBNP in the last 8760 hours.  CBG: No results for input(s): GLUCAP in the last 168 hours.  Micro Recent Results (from the past 240 hour(s))  Urine Culture     Status: None   Collection Time: 02/10/16 11:02 AM  Result Value Ref Range Status   Urine Culture, Routine Final report  Final   Urine Culture result 1 No growth  Final   Culture, Blood     Status: None (Preliminary result)   Collection Time: 02/10/16 11:03 AM  Result Value Ref Range Status   BLOOD CULTURE, ROUTINE Preliminary report  Preliminary   RESULT 1 Comment  Preliminary    Comment: No growth detected at this time.  Culture, Blood     Status: None (Preliminary result)   Collection Time: 02/10/16 11:31 AM  Result Value Ref Range Status   BLOOD CULTURE, ROUTINE Preliminary report  Preliminary   RESULT 1 Comment  Preliminary    Comment: No growth detected at this time.  Culture, blood (Routine X 2) w Reflex to ID Panel     Status: None (Preliminary result)   Collection Time: 02/11/16  5:05 PM  Result Value Ref Range Status   Specimen Description BLOOD LEFT ANTECUBITAL  Final   Special Requests BOTTLES DRAWN AEROBIC AND ANAEROBIC 5 CC EA  Final   Culture   Final    NO GROWTH < 24 HOURS Performed at [facility]    Report Status PENDING  Incomplete  Culture, blood (Routine X 2) w Reflex to ID Panel     Status: None (Preliminary result)   Collection Time: 02/11/16  5:20 PM  Result Value Ref Range Status   Specimen Description BLOOD BLOOD LEFT FOREARM  Final   Special Requests BOTTLES DRAWN AEROBIC AND ANAEROBIC 5 CC EA  Final   Culture   Final    NO GROWTH < 24 HOURS Performed at [facility]    Report Status PENDING  Incomplete     Studies: Dg Chest 2 View  02/11/2016  CLINICAL DATA:  Left-sided chest pain and shortness of Breath EXAM: CHEST  2 VIEW COMPARISON:  02/10/2016 FINDINGS: Cardiac shadow is stable. Nodular densities are noted within both lungs consistent with the known history of lung carcinoma with metastatic disease. Persisting consolidation is noted on the right stable from the prior study. Small bilateral pleural effusions are again seen. No new focal abnormality is noted. IMPRESSION: Stable appearance of lung carcinoma with metastatic disease bilaterally. No new acute abnormality is noted. Electronically Signed    By: Inez Catalina M.D.   On: 02/11/2016 16:52   Ct Angio Chest Pe W/cm &/or Wo Cm  02/10/2016  CLINICAL DATA:  Fever, shortness or breath and cough February 07, 2016. Patient had right lung cancer diagnosed 2016. EXAM: CT ANGIOGRAPHY CHEST WITH CONTRAST TECHNIQUE: Multidetector CT imaging of the chest was performed using the standard protocol during bolus administration of intravenous contrast. Multiplanar CT image reconstructions and MIPs were obtained to evaluate the vascular anatomy. CONTRAST:  161m OMNIPAQUE IOHEXOL 350 MG/ML SOLN COMPARISON:  January 19, 2016 FINDINGS: There is no pulmonary embolus. There is atherosclerosis of the aorta without aneurysmal dilatation. Normal enhancement of the great vessels are noted. The heart size is mildly enlarged. There is minimal pericardial effusion. There are mild enlarge lymph nodes in the mediastinum and in the right hilum unchanged compared  to prior exam. There are small bilateral pleural effusions, right is stable, left is larger compared to prior CT. There is right pleural thickening unchanged compared prior exam. There are diffuse innumerable nodules in bilateral lungs consistent with patient's known lung cancer. There are areas of consolidation some with air bronchograms involving the right upper, right middle, right lower lobe not significantly changed compared to the previous CT. Underlying mass is difficult to discretely separate from the consolidation. There are degenerative joint changes of the spine. No definite focal discrete lytic or blastic lesion is identified. Review of the MIP images confirms the above findings. IMPRESSION: No pulmonary embolus. Changes of lung cancer stable compared to prior CT of January 19, 2016. There are areas of consolidation involving the right lung, difficult to discretely separate underlying mass from the consolidation, but this is unchanged compared to prior CT. There are bilateral pleural effusions, the left pleural  effusion is slightly larger compared to prior CT. Electronically Signed   By: Abelardo Diesel M.D.   On: 02/10/2016 13:50    Scheduled Meds: . ceFEPime (MAXIPIME) IV  1 g Intravenous 3 times per day  . enoxaparin (LOVENOX) injection  40 mg Subcutaneous Q24H  . feeding supplement (ENSURE ENLIVE)  237 mL Oral BID BM  . folic acid  1 mg Oral Daily  . ipratropium-albuterol  3 mL Nebulization TID  . levothyroxine  75 mcg Oral QAC breakfast  . vancomycin  750 mg Intravenous Q12H   Continuous Infusions: . sodium chloride 125 mL/hr at 02/11/16 2124       Time spent: 35 minutes    [facility] Pager [phone] If 7PM-7AM, please contact night-coverage at [email or web address], password [facility] 02/12/2016, 12:26 PM  LOS: 1 day

## 2016-02-12 NOTE — Assessment & Plan Note (Signed)
Patient is status post carboplatin/Alimta chemotherapy regimen 6 cycles completed on 12/30/2015.  Restaging scan obtained on 01/19/2016 of the chest/abdomen/pelvis was stable.  Patient initiated single agent maintenance Alimta chemotherapy with her first cycle on 02/03/2016.  Patient is scheduled for labs, visit, and chemotherapy again on 02/24/2016.

## 2016-02-12 NOTE — Progress Notes (Signed)
SYMPTOM MANAGEMENT CLINIC   HPI: Deborah [name] 70 y.o. female diagnosed with lung cancer.  Patient is status post 6 cycles of carboplatin/Alimta chemotherapy completed on 12/30/2015.  Patient initiated single agent maintenance Alimta chemotherapy on 02/03/2016.  Patient is status post 6 cycles of carboplatin/Alimta chemotherapy completed on 12/30/2015.  She underwent a restaging CT of the chest/abdomen/pelvis on 01/19/2016; which showed stability of her disease.  She initiated single agent maintenance Alimta on 02/03/2016.  Patient is status post 6 cycles of carboplatin/Alimta chemotherapy completed on 12/30/2015.  She underwent a restaging CT of the chest/abdomen/pelvis on 01/19/2016; which showed stability of her disease.  She initiated single agent maintenance Alimta on 02/03/2016.  Patient reported to the [facility] today with complaint of increased fatigue and generalized weakness.  She states that she has been too weak to get out of bed for the last 3 days.  She reports she's had some frequency of urination; but denies any dysuria or hematuria.  She has had a dry cough; but no URI symptoms.  She feels slightly short of breath; and does complain of pain with inspiration.  She has been suffering with some chronic nausea; but no vomiting.  She also reports that she has been constipated for approximately one week.  She has had a fever to maximum 100.5 with chills.  Patient appears fatigued and increasingly weak on exam.  She was resting her head on the exam table when I arrived for the exam.  Breath sounds bilaterally were essentially clear; but with decreased air movement.  There was no wheezing or coughing noted.  Patient does appear slightly short of breath.  O2 sat has decreased down to 92% on room air.  Given patient's lower O2 sats, elevated pulse rate, a plane of pain with inspiration, and history of recent fever-we will order a CT angiogram of the chest to rule out pulmonary  embolism.  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  Blood counts obtained today were essentially normal.  Patient does appear to have a mild urinary tract infection.  Urine culture results pending.  Also obtain blood cultures 2 which were pending results as well. _________________________________________________________  CT angiogram of the chest obtained earlier today was negative for pulmonary embolism.  Lung cancer appears stable compared to prior CT of 01/19/2016.  There were noted.  Bilateral pleural effusions with a left pleural effusion, slightly larger when compared to prior CT.  Reviewed the scan results with both patient and her husband in detail.  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  Vital sign recheck revealed O2 sat 94-95% on room air.  Patient and her husband were advised to call/return ago directly to the emergency department for any worsening symptoms whatsoever.  Advised both patient and her husband may require a repeat thoracentesis or placement of a Pleurx catheter if pleural effusions.  Progress and cause increased dyspnea or discomfort.  Both patient and her husband stated understanding of all instructions. _________________________________________  Update 02/11/16:  Patient presented back to the [facility] today for follow-up.  She states that she continues with mild shortness of breath; but feels that it is unchanged from yesterday.  However, patient is now complaining of some pleuritic left chest wall discomfort; which increases with deep inspiration.  Patient denies any fevers or chills  overnight.  She underwent a CT angiogram of the chest just yesterday which was negative for pulmonary embolism.  It did reveal slight increased bilateral pleural effusions, however.  When entering the  room to perform exam today.-Patient appeared uncomfortable and was clutching her chest.  O2 was applied and patient was transported to the emergency department for further evaluation.  Brief history.  Report were called to the emergency department charge nurse prior to patient being transported to the emergency department via wheelchair.  Per the cancer Center nurse.  Also, alerted the on-call physician, Dr. Marin Olp patient's transport to the emergency department as well.  Shortness of Breath Associated symptoms include a fever and headaches. Pertinent negatives include no abdominal pain, hemoptysis, sore throat, sputum production, vomiting or wheezing.    Review of Systems  Constitutional: Positive for fever, chills and malaise/fatigue.  HENT: Negative for sore throat.   Respiratory: Positive for cough and shortness of breath. Negative for hemoptysis, sputum production and wheezing.   Gastrointestinal: Positive for nausea and constipation. Negative for vomiting, abdominal pain, diarrhea, blood in stool and melena.  Genitourinary: Positive for frequency. Negative for dysuria, urgency, hematuria and flank pain.  Neurological: Positive for weakness and headaches.  All other systems reviewed and are negative.   Past Medical History  Diagnosis Date  . Allergy     Mold  . Hyperlipidemia 08/04/2011  . Hypothyroidism 08/04/2011  . Atrophic vaginitis   . Osteopenia 10/2012    T score -1.7 FRAX 7%/0.3%  . Vertigo   . Shortness of breath dyspnea   . Meniere's disease   . Encounter for antineoplastic chemotherapy 10/28/2015  . 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 Basal cell carcinoma of skin 2010  . Non-small cell lung cancer [facility]) September 2016    Adenocarcinoma  . Malignant neoplasm of right upper lobe of lung ([place]) 08/26/2015    Past Surgical History  Procedure Laterality Date  . Tonsillectomy  1952  . Vaginal hysterectomy  1980  .  Breast biopsy  1999    RADIATION TREATMENTS  . Breast lumpectomy Right 1998    right  . 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    has No Known Allergies.    Medication List       This list is accurate as of: 02/11/16  4:04 PM.  Always use your most recent med list.               albuterol 108 (90 Base) MCG/ACT inhaler  Commonly known as:  PROVENTIL HFA;VENTOLIN HFA  Inhale 2 puffs into the lungs every 6 (six) hours as needed for wheezing or shortness of breath.     ciprofloxacin 500 MG tablet  Commonly known as:  CIPRO  Take 1 tablet (500 mg total) by mouth 2 (two) times daily.     dexamethasone 4 MG tablet  Commonly known as:  DECADRON  4 mg  po bid the day before, day of and day after chemo     folic acid 1 MG tablet  Commonly known as:  FOLVITE  Take 1 tablet (1 mg total) by mouth daily.     HYDROcodone-homatropine 5-1.5 MG/5ML syrup  Commonly known as:  HYCODAN  Take 5 mLs by mouth every 6 (six) hours as needed for cough.     levothyroxine 75 MCG tablet  Commonly known as:  SYNTHROID, LEVOTHROID  TAKE 1 TABLET BY MOUTH DAILY.     LORazepam 0.5 MG tablet  Commonly known as:  ATIVAN  Take one every eight hours as needed for anxiety.  Prefers one at bedtime.     ondansetron  8 MG tablet  Commonly known as:  ZOFRAN  Take 1 tablet (8 mg total) by mouth every 8 (eight) hours as needed for nausea or vomiting.     ondansetron 8 MG tablet  Commonly known as:  ZOFRAN  TAKE 1 TABLET BY MOUTH EVERY 8 HOURS AS NEEDED FOR NAUSEA OR VOMITING     prochlorperazine 10 MG tablet  Commonly known as:  COMPAZINE  Take 1 tablet (10 mg total) by mouth every 6 (six) hours as needed for nausea or vomiting.     triamterene-hydrochlorothiazide 37.5-25 MG capsule  Commonly known as:  DYAZIDE  Take 1 capsule by mouth daily as needed. Reported on 02/10/2016     Vitamin D-3 1000 units Caps  Take 1 capsule by mouth daily. Reported on 02/11/2016         PHYSICAL EXAMINATION  Oncology Vitals 02/12/2016 02/11/2016  Height - -  Weight - -  Weight (lbs) - -  BMI (kg/m2) - -  Temp 97.6 -  Pulse 96 -  Resp 18 -  SpO2 93 92  BSA (m2) - -   BP Readings from Last 2 Encounters:  02/12/16 104/62  02/11/16 99/64    Physical Exam  Constitutional: She is oriented to person, place, and time. She appears dehydrated.  Patient appears fatigued and weak.  HENT:  Head: Normocephalic and atraumatic.  Mouth/Throat: Oropharynx is clear and moist.  Eyes: Conjunctivae and EOM are normal. Pupils are equal, round, and reactive to light. Right eye exhibits no discharge. Left eye exhibits no discharge. No scleral icterus.  Neck: Normal range of motion. Neck supple. No JVD present. No tracheal deviation present. No thyromegaly present.  Cardiovascular: Regular rhythm, normal heart sounds and intact distal pulses.   Tachycardia.  Brief exam today revealed patient uncomfortable and clutching at her left chest.  Patient reports increased pleuritic-type pain with inspiration.  Pulmonary/Chest: No stridor. No respiratory distress. She has no wheezes. She has no rales. She exhibits no tenderness.  Slightly decreased breath sounds bilaterally; but no cough or wheeze.  Patient appears slightly short of  breath.  Abdominal: Soft. Bowel sounds are normal. She exhibits no distension and no mass. There is no tenderness. There is no rebound and no guarding.  Musculoskeletal: Normal range of motion. She exhibits no edema or tenderness.  Lymphadenopathy:    She has no cervical adenopathy.  Neurological: She is alert and oriented to person, place, and time.  Skin: Skin is warm and dry. No rash noted. No erythema. There is pallor.  Psychiatric: Affect normal.    LABORATORY DATA:. Admission on 02/11/2016  Component Date Value Ref Range Status  . Sodium 02/11/2016 139  135 - 145 mmol/L Final  . Potassium 02/11/2016 3.7  3.5 - 5.1 mmol/L Final  . Chloride 02/11/2016  104  101 - 111 mmol/L Final  . CO2 02/11/2016 22  22 - 32 mmol/L Final  . Glucose, Bld 02/11/2016 82  65 - 99 mg/dL Final  . BUN 02/11/2016 11  6 - 20 mg/dL Final  . Creatinine, Ser 02/11/2016 0.68  0.44 - 1.00 mg/dL Final  . Calcium 02/11/2016 8.8* 8.9 - 10.3 mg/dL Final  . GFR calc non Af Amer 02/11/2016 >60  >60 mL/min Final  . GFR calc Af Amer 02/11/2016 >60  >60 mL/min Final   Comment: (NOTE) The eGFR has been calculated using the CKD EPI equation. This calculation has not been validated in all clinical situations. eGFR's persistently <60 mL/min signify possible Chronic Kidney Disease.   . Anion gap 02/11/2016 13  5 - 15 Final  . WBC 02/11/2016 3.0* 4.0 - 10.5 K/uL Final  . RBC 02/11/2016 3.33* 3.87 - 5.11 MIL/uL Final  . Hemoglobin 02/11/2016 10.1* 12.0 - 15.0 g/dL Final  . HCT 02/11/2016 30.1* 36.0 - 46.0 % Final  . MCV 02/11/2016 90.4  78.0 - 100.0 fL Final  . MCH 02/11/2016 30.3  26.0 - 34.0 pg Final  . MCHC 02/11/2016 33.6  30.0 - 36.0 g/dL Final  . RDW 02/11/2016 13.7  11.5 - 15.5 % Final  . Platelets 02/11/2016 116* 150 - 400 K/uL Final   Comment: SPECIMEN CHECKED FOR CLOTS PLATELET COUNT CONFIRMED BY SMEAR   . Troponin i, poc 02/11/2016 0.00  0.00 - 0.08 ng/mL Final  . Comment 3 02/11/2016          Final    Comment: Due to the release kinetics of cTnI, a negative result within the first hours of the onset of symptoms does not rule out myocardial infarction with certainty. If myocardial infarction is still suspected, repeat the test at appropriate intervals.   . Troponin I 02/11/2016 <0.03  <0.031 ng/mL Final   Comment:        NO INDICATION OF MYOCARDIAL INJURY.   . B Natriuretic Peptide 02/11/2016 15.3  0.0 - 100.0 pg/mL Final  . Lactic Acid, Venous 02/11/2016 1.37  0.5 - 2.0 mmol/L Final  Appointment on 02/10/2016  Component Date Value Ref Range Status  . WBC 02/10/2016 4.2  3.9 - 10.3 10e3/uL Final  . NEUT# 02/10/2016 2.4  1.5 - 6.5 10e3/uL Final  . HGB 02/10/2016 11.8  11.6 - 15.9 g/dL Final  . HCT 02/10/2016 35.1  34.8 - 46.6 % Final  . Platelets 02/10/2016 149  145 - 400 10e3/uL Final  . MCV 02/10/2016 90.8  79.5 - 101.0 fL Final  . MCH 02/10/2016 30.5  25.1 - 34.0 pg Final  . MCHC 02/10/2016 33.6  31.5 - 36.0 g/dL Final  . RBC 02/10/2016 3.86  3.70 - 5.45 10e6/uL Final  . RDW 02/10/2016 15.7* 11.2 - 14.5 % Final  . lymph# 02/10/2016 1.1  0.9 - 3.3 10e3/uL Final  . MONO# 02/10/2016 0.6  0.1 - 0.9 10e3/uL Final  . Eosinophils Absolute 02/10/2016 0.0  0.0 - 0.5 10e3/uL Final  . Basophils Absolute 02/10/2016 0.0  0.0 - 0.1 10e3/uL Final  . NEUT% 02/10/2016 58.1  38.4 - 76.8 % Final  . LYMPH% 02/10/2016 27.1  14.0 - 49.7 % Final  . MONO% 02/10/2016 13.2  0.0 - 14.0 % Final  . EOS% 02/10/2016 0.7  0.0 - 7.0 % Final  . BASO% 02/10/2016 0.9  0.0 - 2.0 % Final  . Sodium 02/10/2016 135* 136 - 145 mEq/L Final  . Potassium 02/10/2016 4.1  3.5 - 5.1 mEq/L Final  . Chloride 02/10/2016 101  98 - 109 mEq/L Final  . CO2 02/10/2016 25  22 - 29 mEq/L Final  . Glucose 02/10/2016 97  70 - 140 mg/dl Final   Glucose reference range is for nonfasting patients. Fasting glucose reference range is 70- 100.  Marland Kitchen BUN 02/10/2016 11.5  7.0 - 26.0 mg/dL Final  . Creatinine 02/10/2016 0.7  0.6 - 1.1 mg/dL  Final  . Total Bilirubin 02/10/2016 1.14  0.20 - 1.20 mg/dL Final  . Alkaline Phosphatase 02/10/2016 114  40 - 150 U/L Final  . AST 02/10/2016 38* 5 - 34 U/L Final  . ALT 02/10/2016 10  0 - 55 U/L Final  . Total Protein 02/10/2016 6.0* 6.4 - 8.3 g/dL Final  . Albumin 02/10/2016 2.6* 3.5 - 5.0 g/dL Final  . Calcium 02/10/2016 9.1  8.4 - 10.4 mg/dL Final  . Anion Gap 02/10/2016 10  3 - 11 mEq/L Final  . EGFR 02/10/2016 82* >90 ml/min/1.73 m2 Final   eGFR is calculated using the CKD-EPI Creatinine Equation (2009)  . BLOOD CULTURE, ROUTINE 02/10/2016 Preliminary report   Preliminary  . RESULT 1 02/10/2016 Comment   Preliminary   No growth detected at this time.  . Glucose 02/10/2016 Negative  Negative mg/dL Final  . Bilirubin (Urine) 02/10/2016 Negative  Negative Final  . Ketones 02/10/2016 15  Negative mg/dL Final  . Specific Gravity, Urine 02/10/2016 1.010  1.003 - 1.035 Final  . Blood 02/10/2016 Negative  Negative Final  . pH 02/10/2016 6.0  4.6 - 8.0 Final  . Protein 02/10/2016 < 30  Negative- <30 mg/dL Final  . Urobilinogen, UR 02/10/2016 0.2  0.2 - 1 mg/dL Final  . Nitrite 02/10/2016 Negative  Negative Final  . Leukocyte Esterase 02/10/2016 Small  Negative Final  . RBC / HPF 02/10/2016 Negative  0 - 2 Final  . WBC, UA 02/10/2016 3-6  0 - 2 Final  . Bacteria, UA 02/10/2016 Trace  Negative- Trace Final  . Epithelial Cells 02/10/2016 Occasional  Negative- Few Final  . Urine Culture, Routine 02/10/2016 Final report   Final  . Urine Culture result 1 02/10/2016 No growth   Final  . BLOOD CULTURE, ROUTINE 02/10/2016 Preliminary report   Preliminary  . RESULT 1 02/10/2016 Comment   Preliminary   No growth detected at this time.     RADIOGRAPHIC STUDIES: Dg Chest 2 View  02/11/2016  CLINICAL DATA:  Left-sided chest pain and shortness of Breath EXAM: CHEST  2 VIEW COMPARISON:  02/10/2016 FINDINGS: Cardiac shadow is stable. Nodular densities are noted within both lungs consistent with the  known history of lung carcinoma with metastatic disease. Persisting consolidation is noted on the right stable from the prior study. Small bilateral pleural effusions are again seen. No new focal abnormality is noted. IMPRESSION: Stable appearance of lung carcinoma with metastatic disease bilaterally. No new acute abnormality is noted. Electronically Signed   By: Inez Catalina M.D.   On: 02/11/2016 16:52   Ct Angio Chest Pe W/cm &/or Wo Cm  02/10/2016  CLINICAL DATA:  Fever, shortness or breath and cough February 07, 2016. Patient had right lung cancer diagnosed 2016. EXAM: CT ANGIOGRAPHY CHEST WITH CONTRAST TECHNIQUE: Multidetector CT imaging of the chest was performed using the standard protocol during bolus administration of intravenous contrast. Multiplanar CT image reconstructions and MIPs were obtained to evaluate the vascular anatomy. CONTRAST:  161m OMNIPAQUE IOHEXOL 350 MG/ML SOLN COMPARISON:  January 19, 2016 FINDINGS: There  is no pulmonary embolus. There is atherosclerosis of the aorta without aneurysmal dilatation. Normal enhancement of the great vessels are noted. The heart size is mildly enlarged. There is minimal pericardial effusion. There are mild enlarge lymph nodes in the mediastinum and in the right hilum unchanged compared to prior exam. There are small bilateral pleural effusions, right is stable, left is larger compared to prior CT. There is right pleural thickening unchanged compared prior exam. There are diffuse innumerable nodules in bilateral lungs consistent with patient's known lung cancer. There are areas of consolidation some with air bronchograms involving the right upper, right middle, right lower lobe not significantly changed compared to the previous CT. Underlying mass is difficult to discretely separate from the consolidation. There are degenerative joint changes of the spine. No definite focal discrete lytic or blastic lesion is identified. Review of the MIP images confirms  the above findings. IMPRESSION: No pulmonary embolus. Changes of lung cancer stable compared to prior CT of January 19, 2016. There are areas of consolidation involving the right lung, difficult to discretely separate underlying mass from the consolidation, but this is unchanged compared to prior CT. There are bilateral pleural effusions, the left pleural effusion is slightly larger compared to prior CT. Electronically Signed   By: Abelardo Diesel M.D.   On: 02/10/2016 13:50    ASSESSMENT/PLAN:    UTI (urinary tract infection) Patient states that she has been experiencing frequency of urination since beginning of this week.  She denies any dysuria or hematuria.  She has been experiencing some chronic nausea; but no vomiting; which she is associating with her most recent cycle of chemotherapy.  She notes she had a fever to maximum 100.5 intermittently since this past Monday, 02/07/2016.  Urinalysis obtained today revealed no hematuria, negative nitrites, small amount of leukocyte  esterase, WBCs 3-6, and trace bacteria.  Urine culture results pending.  Blood pressure stable at 123/69, pulse increased to 122, temperature 98.5 today. _____________________________________________________________________________  Patient will be prescribed Cipro 500 mg twice daily to take for treatment of urinary tract infection.  Culture results are pending.    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  Restaging scan obtained on 01/19/2016 of the chest/abdomen/pelvis was stable.  Patient initiated single agent maintenance Alimta chemotherapy with her first cycle on 02/03/2016.  Patient is scheduled for labs, visit, and chemotherapy again on 02/24/2016.    Dyspnea Patient is status post 6 cycles of carboplatin/Alimta chemotherapy completed on 12/30/2015.  She underwent a restaging CT of the  chest/abdomen/pelvis on 01/19/2016; which showed stability of her disease.  She initiated single agent maintenance Alimta on 02/03/2016.  Patient reported to the [facility] today with complaint of increased fatigue and generalized weakness.  She states that she has been too weak to get out of bed for the last 3 days.  She reports she's had some frequency of urination; but denies any dysuria or hematuria.  She has had a dry cough; but no URI symptoms.  She feels slightly short of breath; and does complain of pain with inspiration.  She has been suffering with some chronic nausea; but no vomiting.  She also reports that she has been constipated for approximately one week.  She has had a fever to maximum 100.5 with chills.  Patient appears fatigued and increasingly weak on exam.  She was resting her head on the exam table when I arrived for the exam.  Breath sounds bilaterally were essentially clear; but with decreased air movement.  There was no wheezing or coughing noted.  Patient does appear slightly short of breath.  O2 sat has decreased down to 92% on room air.  Given patient's lower O2 sats, elevated pulse rate, a plane of pain with inspiration, and history of recent fever-we will order a CT angiogram of the chest to rule out pulmonary embolism.  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  Blood counts obtained today were essentially normal.  Patient does appear to have a mild urinary tract infection.  Urine culture results pending.  Also obtain blood cultures 2 which were pending results as well. _________________________________________________________  CT angiogram of the chest obtained earlier today was negative for pulmonary embolism.  Lung cancer appears stable compared to prior CT of 01/19/2016.  There were noted.  Bilateral pleural effusions with a left pleural effusion, slightly larger when compared to prior  CT.  Reviewed the scan results with both patient and her husband in detail.  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  Vital sign recheck revealed O2 sat 94-95% on room air.  Patient and her husband were advised to call/return ago directly to the emergency department for any worsening symptoms whatsoever.  Advised both patient and her husband may require a repeat thoracentesis or placement of a Pleurx catheter if pleural effusions.  Progress and cause increased dyspnea or discomfort.  Both patient and her husband stated understanding of all instructions. _________________________________________  Update 02/11/16:  Patient presented back to the [facility] today for follow-up.  She states that she continues with mild shortness of breath; but feels that it is unchanged from yesterday.  However, patient is now complaining of some pleuritic left chest wall discomfort; which increases with deep inspiration.  Patient denies any fevers or chills overnight.  She underwent a CT angiogram of the chest just yesterday which was negative for pulmonary embolism.  It did reveal slight increased bilateral pleural effusions, however.  When entering the room to perform exam today.-Patient appeared uncomfortable and was clutching her chest.  O2 was applied and patient was transported to the emergency department for further evaluation.  Brief history.  Report were called to the emergency department charge nurse prior to patient being transported to the emergency department via wheelchair.  Per the cancer Center nurse.  Also, alerted the on-call physician, Dr. Marin Olp patient's transport to the emergency department as well.       Patient stated understanding of all instructions; and was in agreement with this plan of care. The patient knows to call the clinic with any problems, questions or concerns.    Review/collaboration with Dr. Marin Olp on call for Dr. Julien Nordmann regarding all aspects of patient's visit today.   Total time spent with patient was 25 minutes;  with greater than 75 percent of that time spent in face to face counseling regarding patient's symptoms,  and coordination of care and follow up.  Disclaimer:This dictation was prepared with Dragon/digital dictation along with [employer]. Any transcriptional errors that result from this process are unintentional.  Drue Second, NP 02/12/2016

## 2016-02-12 NOTE — Assessment & Plan Note (Signed)
Patient states that she has been experiencing frequency of urination since beginning of this week.  She denies any dysuria or hematuria.  She has been experiencing some chronic nausea; but no vomiting; which she is associating with her most recent cycle of chemotherapy.  She notes she had a fever to maximum 100.5 intermittently since this past Monday, 02/07/2016.  Urinalysis obtained today revealed no hematuria, negative nitrites, small amount of leukocyte  esterase, WBCs 3-6, and trace bacteria.  Urine culture results pending.  Blood pressure stable at 123/69, pulse increased to 122, temperature 98.5 today. _____________________________________________________________________________  Patient will be prescribed Cipro 500 mg twice daily to take for treatment of urinary tract infection.  Culture results are pending.

## 2016-02-12 NOTE — Assessment & Plan Note (Signed)
Patient is status post 6 cycles of carboplatin/Alimta chemotherapy completed on 12/30/2015.  She underwent a restaging CT of the chest/abdomen/pelvis on 01/19/2016; which showed stability of her disease.  She initiated single agent maintenance Alimta on 02/03/2016.  Patient reported to the [facility] today with complaint of increased fatigue and generalized weakness.  She states that she has been too weak to get out of bed for the last 3 days.  She reports she's had some frequency of urination; but denies any dysuria or hematuria.  She has had a dry cough; but no URI symptoms.  She feels slightly short of breath; and does complain of pain with inspiration.  She has been suffering with some chronic nausea; but no vomiting.  She also reports that she has been constipated for approximately one week.  She has had a fever to maximum 100.5 with chills.  Patient appears fatigued and increasingly weak on exam.  She was resting her head on the exam table when I arrived for the exam.  Breath sounds bilaterally were essentially clear; but with decreased air movement.  There was no wheezing or coughing noted.  Patient does appear slightly short of breath.  O2 sat has decreased down to 92% on room air.  Given patient's lower O2 sats, elevated pulse rate, a plane of pain with inspiration, and history of recent fever-we will order a CT angiogram of the chest to rule out pulmonary embolism.  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  Blood counts obtained today were essentially normal.  Patient does appear to have a mild urinary tract infection.  Urine culture results pending.  Also obtain blood cultures 2 which were pending results as well. _________________________________________________________  CT angiogram of the chest obtained earlier today was negative for pulmonary embolism.  Lung cancer appears stable compared to prior CT of  01/19/2016.  There were noted.  Bilateral pleural effusions with a left pleural effusion, slightly larger when compared to prior CT.  Reviewed the scan results with both patient and her husband in detail.  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  Vital sign recheck revealed O2 sat 94-95% on room air.  Patient and her husband were advised to call/return ago directly to the emergency department for any worsening symptoms whatsoever.  Advised both patient and her husband may require a repeat thoracentesis or placement of a Pleurx catheter if pleural effusions.  Progress and cause increased dyspnea or discomfort.  Both patient and her husband stated understanding of all instructions. _________________________________________  Update 02/11/16:  Patient presented back to the [facility] today for follow-up.  She states that she continues with mild shortness of breath; but feels that it is unchanged from yesterday.  However, patient is now complaining of some pleuritic left chest wall discomfort; which increases with deep inspiration.  Patient denies any fevers or chills overnight.  She underwent a CT angiogram of the chest just yesterday which was negative for pulmonary embolism.  It did reveal slight increased bilateral pleural effusions, however.  When entering the room to perform exam today.-Patient appeared uncomfortable and was clutching her chest.  O2 was applied and patient was transported to the emergency department for further evaluation.  Brief history.  Report were called to the emergency department charge nurse prior to patient being transported to the emergency  department via wheelchair.  Per the cancer Center nurse.  Also, alerted the on-call physician, Dr. Marin Olp patient's transport to the emergency department as well.

## 2016-02-12 NOTE — H&P (Signed)
[facility] History and Physical  Deborah [name]:[id number] DOB: May 23, 1946 DOA: 02/11/2016  Referring physician: Dr. Freida Busman PCP: Oliver Barre, MD   Chief Complaint: fevers, chills, nausea  HPI: Deborah [name] is a 70 y.o. 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  Has completed 2-agent systemic chemoRx from Sept '16 > Jan '17.  Started on single agent Rx with Alimta on 02/03/16.  Now presents with severe fatigue and gen weakness. Unable to get OOB.  NO dysuria, some dry cough.  NO URI symptoms.  Slight SOB, +pain w inspiration. Chron nausea, no emesis. Constipated x 1 week. +fever to 100.5 max at home. Seen by Onc NP today and sent for CTA of chest which showed no PE, small bilat effusions, diffuse basilar lung nodules; areas of consolidation w some air bronchograms in RUL/ RML and RLL but not sig changed from prior.    In ED on 2L O2 sats are 91-93 %.  BP 108/60, RR 16, temp 98.5  HR 105.    Na 139  K 3.7  CL 104  CO2 22  BUN 11 Creat 0.68  Ca 8.8  . WBC 3.0 (down)  Hb 10.1  plt 116.     LFT's done yesterday showed alb 2.6, AST / ALT essentially wnl.  Tbili 1.14.     Chart review: Onc visit / Sept 2016 - hx BRCA 20 yrs ago s/p lumpect/ XRT (no chemo/hormonal Rx) developed cough/ SOB aug 2016. CXR abnormal, rx for PNA, then had chest CT showing masslike area RUL and multiple pulm nodules in the lower lobes.  08/24/15 had thoracentesis w cytology showing +malignant cells c/w metastatic adenocarcinoma.  Lung primary was suspected based on immunohistochemistry.  SEnt to ONC where dx was stage IV (T1b, N2,  M1) non-small cell lung cancer/ adenoCa presenting w RUL lung mass, med LAN and malig pleural effusion / innumerable bilat pulm nodules.  Rx'd with systemic chemoRx with carboplatin/ Alimta every 3 weeks from late Sept '16 thru Jan 2017.    ROS  denies prod cough, wheezing  no abd pain   chron nausea,no emesis  no jotinpain or rash  no HA  nosore throat  Where does patient live home Can patient participate in ADLs? yes  Past Medical History  Past Medical History  Diagnosis Date  . Allergy     Mold  . Hyperlipidemia 08/04/2011  . Hypothyroidism 08/04/2011  . Atrophic vaginitis   . Osteopenia 10/2012    T score -1.7 FRAX 7%/0.3%  . Vertigo   . Shortness of breath dyspnea   . Meniere's disease   . Encounter for antineoplastic chemotherapy 10/28/2015  . 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 Basal cell carcinoma of skin 2010  . Non-small cell lung cancer [facility]) September 2016    Adenocarcinoma  . Malignant neoplasm of right upper lobe of lung (HCC) 08/26/2015   Past Surgical History  Past Surgical History  Procedure Laterality Date  . Tonsillectomy  1952  . Vaginal hysterectomy  1980  . Breast biopsy  1999    RADIATION TREATMENTS  . Breast lumpectomy Right 1998    right  . Chest tube insertion Right 09/15/2015    Procedure: INSERTION RIGHT PLEURAL DRAINAGE CATHETER;  Surgeon: Loreli Slot, MD;  Location: [facility] OR;  Service: Thoracic;  Laterality: Right;  Family History  Family History  Problem Relation Age of Onset  . Dementia Mother   . Breast cancer Mother     Breast Cancer  . Heart disease Other     Grandparents  . Heart disease Father   . Diabetes Daughter     TYPE 2  . Melanoma Mother    Social History  reports that she has never smoked. She has never used smokeless tobacco. She reports that she drinks about 1.2 oz of alcohol per week. She reports that she does not use illicit drugs. Allergies No Known Allergies Home medications Prior to Admission medications   Medication Sig Start Date End Date Taking? Authorizing Provider  albuterol (PROVENTIL HFA;VENTOLIN HFA) 108 (90 BASE) MCG/ACT inhaler Inhale 2 puffs into the lungs every 6 (six) hours as needed for wheezing or shortness of breath.  07/28/15  Yes Biagio Borg, MD  ciprofloxacin (CIPRO) 500 MG tablet Take 1 tablet (500 mg total) by mouth 2 (two) times daily. 02/10/16  Yes Susanne Borders, NP  dexamethasone (DECADRON) 4 MG tablet 4 mg po bid the day before, day of and day after chemo 09/10/15  Yes Curt Bears, MD  folic acid (FOLVITE) 1 MG tablet Take 1 tablet (1 mg total) by mouth daily. 09/10/15  Yes Curt Bears, MD  HYDROcodone-homatropine [facility]) 5-1.5 MG/5ML syrup Take 5 mLs by mouth every 6 (six) hours as needed for cough. 09/27/15  Yes Curt Bears, MD  levothyroxine (SYNTHROID, LEVOTHROID) 75 MCG tablet TAKE 1 TABLET BY MOUTH DAILY. 08/06/14  Yes Biagio Borg, MD  LORazepam (ATIVAN) 0.5 MG tablet Take one every eight hours as needed for anxiety.  Prefers one at bedtime. 01/04/16  Yes Curt Bears, MD  ondansetron (ZOFRAN) 8 MG tablet Take 1 tablet (8 mg total) by mouth every 8 (eight) hours as needed for nausea or vomiting. 11/02/15  Yes Curt Bears, MD  ondansetron (ZOFRAN) 8 MG tablet TAKE 1 TABLET BY MOUTH EVERY 8 HOURS AS NEEDED FOR NAUSEA OR VOMITING 01/03/16  Yes Curt Bears, MD  triamterene-hydrochlorothiazide (DYAZIDE) 37.5-25 MG per capsule Take 1 capsule by mouth daily as needed. Reported on 02/10/2016 08/04/15  Yes Historical Provider, MD  prochlorperazine (COMPAZINE) 10 MG tablet Take 1 tablet (10 mg total) by mouth every 6 (six) hours as needed for nausea or vomiting. Patient not taking: Reported on 02/10/2016 09/10/15   Curt Bears, MD   Liver Function Tests  Recent Labs Lab 02/10/16 1103  AST 38*  ALT 10  ALKPHOS 114  BILITOT 1.14  PROT 6.0*  ALBUMIN 2.6*   No results for input(s): LIPASE, AMYLASE in the last 168 hours. CBC  Recent Labs Lab 02/10/16 1102 02/11/16 1638  WBC 4.2 3.0*  NEUTROABS 2.4  --   HGB 11.8 10.1*  HCT 35.1 30.1*  MCV 90.8 90.4  PLT 149 268*   Basic Metabolic Panel  Recent Labs Lab 02/10/16 1103 02/11/16 1638  NA 135* 139  K 4.1 3.7  CL  --   104  CO2 25 22  GLUCOSE 97 82  BUN 11.5 11  CREATININE 0.7 0.68  CALCIUM 9.1 8.8*     Filed Vitals:   02/11/16 1830 02/11/16 1831 02/11/16 2058 02/11/16 2108  BP: 108/60 108/60 98/48   Pulse:  105 106   Temp:  98.5 F (36.9 C) 97.9 F (36.6 C)   TempSrc:  Oral Oral   Resp: '30 16 18   '$ Height:   '5\' 9"'$  (1.753 m)   Weight:  61.9 kg (136 lb 7.4 oz)   SpO2:  93% 94% 92%   Exam: Alert, mild-mod dyspnea,no use of acc muscles No rash, cyanosis or gangrene Sclera anicteric, throat clear, TM's clear bilat No jvd, flat neck veins, no LAN Chest scattered rales bilat bases,nowheezing RRR no MRG, tachy Abd soft ntnd nomass or ascites no hsm +bs GU defer MSno joint effusion/ muscle tone is good Ext no edema, wounds or ulcers Neuro is alert , Ox 3   CXR (independently reviewed) > 2/24 stable appearance of lung carcinoma w metastatic disease bilat.  No acute change CT chest > no PE. Small bilat pleural effusions, R is stable and L is larger than 2/1 study.  No change bilat basilar pulm nodules.  Areas of consolidation RML/RUL/RLL read as no different than 2/1 study.    Assessment: 1 Chest pain/ dyspnea - borderline hypoxemia.  L sided CP /pleuritic may be from new L malig effusion. The effusion is not very large though,not sure if it could be drained. Has had prior R malig effusion drained.  CT suggestive of consolidation in R chest, some of this may be tumor but w fevers at home and increased WOB will plan to give empiric abx for possible PNA.  2 NSCCa of lung/non-squamous - sp carbo/ Alimta for 3.5 months, just switched over to single agent Rx with Alimta (pemetrexed, antimetabolite).  3 Pancytopenia - ? Due to chemo. First dose of maint chemo w Alimta was on 2/16, 12 days ago 4 Hx remote breast cancer - 20 yrs ago  5 Vol depletion - looks dry on exam, tachy and hasn't been eating well d/t nausea  Plan - admit, IV abx for poss PNA, IVF's.     DVT Prophylaxis lovenox Code Status:  full  Family Communication: husband at bedside  Disposition Plan: dc to home when stable    [place] D [facility] Pager [phone]  Cell [phone]  If 7PM-7AM, please contact night-coverage [email or web address] Password [facility] 02/11/2016, 7:07 PM

## 2016-02-13 ENCOUNTER — Other Ambulatory Visit: Payer: Self-pay

## 2016-02-13 LAB — CBC WITH DIFFERENTIAL/PLATELET
BASOS ABS: 0 10*3/uL (ref 0.0–0.1)
Basophils Relative: 0 %
EOS ABS: 0.1 10*3/uL (ref 0.0–0.7)
Eosinophils Relative: 4 %
HEMATOCRIT: 25.3 % — AB (ref 36.0–46.0)
Hemoglobin: 8.3 g/dL — ABNORMAL LOW (ref 12.0–15.0)
LYMPHS ABS: 0.8 10*3/uL (ref 0.7–4.0)
Lymphocytes Relative: 35 %
MCH: 30.3 pg (ref 26.0–34.0)
MCHC: 32.8 g/dL (ref 30.0–36.0)
MCV: 92.3 fL (ref 78.0–100.0)
MONO ABS: 0.7 10*3/uL (ref 0.1–1.0)
MONOS PCT: 29 %
Neutro Abs: 0.8 10*3/uL — ABNORMAL LOW (ref 1.7–7.7)
Neutrophils Relative %: 32 %
PLATELETS: 93 10*3/uL — AB (ref 150–400)
RBC: 2.74 MIL/uL — AB (ref 3.87–5.11)
RDW: 14.1 % (ref 11.5–15.5)
WBC: 2.4 10*3/uL — AB (ref 4.0–10.5)

## 2016-02-13 LAB — COMPREHENSIVE METABOLIC PANEL
ALBUMIN: 2.3 g/dL — AB (ref 3.5–5.0)
ALK PHOS: 90 U/L (ref 38–126)
ALT: 18 U/L (ref 14–54)
AST: 45 U/L — AB (ref 15–41)
Anion gap: 7 (ref 5–15)
BILIRUBIN TOTAL: 0.6 mg/dL (ref 0.3–1.2)
BUN: 7 mg/dL (ref 6–20)
CO2: 23 mmol/L (ref 22–32)
Calcium: 8.1 mg/dL — ABNORMAL LOW (ref 8.9–10.3)
Chloride: 106 mmol/L (ref 101–111)
Creatinine, Ser: 0.75 mg/dL (ref 0.44–1.00)
GFR calc Af Amer: >60 mL/min (ref >60)
GFR calc non Af Amer: >60 mL/min (ref >60)
Glucose, Bld: 94 mg/dL (ref 65–99)
Potassium: 3.5 mmol/L (ref 3.5–5.1)
Sodium: 136 mmol/L (ref 135–145)
TOTAL PROTEIN: 4.8 g/dL — AB (ref 6.5–8.1)

## 2016-02-13 LAB — TROPONIN I

## 2016-02-13 MED ORDER — FILGRASTIM 480 MCG/1.6ML IJ SOLN
480.0000 ug | Freq: Once | INTRAMUSCULAR | Status: AC
  Administered 2016-02-13: 480 ug via SUBCUTANEOUS
  Filled 2016-02-13: qty 1.6

## 2016-02-13 NOTE — Progress Notes (Signed)
[facility] PROGRESS NOTE   Deborah [name] QTM:[id number] DOB: [birth year] DOA: 02/11/2016 PCP: [name], MD  HPI/Subjective: Seen with husband at bedside, no fever or chills overnight. Felt she is doing much better than yesterday, overall improved.  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 Patient has left-sided pleuritic chest pain with deep inspiration. Has mild hypoxia on admission, CT angiography done and showed no evidence of PE. Treat as HCAP was on cefepime and vancomycin. Supportive management with bronchodilators, antitussives, mucolytics and oxygen as needed. She is not on oxygen at home. Continue current treatment for today, evaluate for discharge in 1-2 days.  NSCLC Patient has non-small cell lung cancer, status post carboplatin and Alimta chemotherapy. Follows with Dr. Julien Nordmann as outpatient. CT did not show worsening/progression.  Pancytopenia This is likely secondary to chemotherapy, last maintenance chemotherapy patient received was on 02/03/2016. Follow total total WBCs 2.4 and neutrophil 0.8 I will give a shot of Neupogen 1 does, check WBC/differential in a.m.  Hx remote breast cancer , 20 yrs ago   Vol depletion - looks dry on exam, this is resolved with IV fluid hydration.  Code Status: Full Code Family Communication: Plan discussed with the patient. Disposition Plan: Remains inpatient Diet: Diet Heart Room service appropriate?: Yes; Fluid consistency::  Thin  Consultants:  None  Procedures:  None  Antibiotics:  Cefepime and vancomycin.   Objective: Filed Vitals:   02/12/16 2105 02/13/16 0514  BP: 107/60 97/53  Pulse: 107 101  Temp: 98.3 F (36.8 C) 98.5 F (36.9 C)  Resp: 18 18    Intake/Output Summary (Last 24 hours) at 02/13/16 1153 Last data filed at 02/13/16 0100  Gross per 24 hour  Intake   2250 ml  Output      0 ml  Net   2250 ml   Filed Weights   02/11/16 2058  Weight: 60 kg (132 lb 4.4 oz)    Exam: General: Alert and awake, oriented x3, not in any acute distress. HEENT: anicteric sclera, pupils reactive to light and accommodation, EOMI CVS: S1-S2 clear, no murmur rubs or gallops Chest: clear to auscultation bilaterally, no wheezing, rales or rhonchi Abdomen: soft nontender, nondistended, normal bowel sounds, no organomegaly Extremities: no cyanosis, clubbing or edema noted bilaterally Neuro: Cranial nerves II-XII intact, no focal neurological deficits  Data Reviewed: Basic Metabolic Panel:  Recent Labs Lab 02/10/16 1103 02/11/16 1638 02/13/16 0504  NA 135* 139 136  K 4.1 3.7 3.5  CL  --  104 106  CO2 '25 22 23  '$ GLUCOSE 97 82 94  BUN 11.'5 11 7  '$ CREATININE 0.7 0.68 0.75  CALCIUM 9.1 8.8* 8.1*   Liver Function Tests:  Recent Labs Lab 02/10/16 1103 02/13/16 0504  AST 38* 45*  ALT 10 18  ALKPHOS 114 90  BILITOT 1.14 0.6  PROT 6.0* 4.8*  ALBUMIN 2.6* 2.3*   No results for input(s): LIPASE, AMYLASE in the last 168 hours. No results for input(s): AMMONIA in the last 168 hours. CBC:  Recent Labs Lab 02/10/16 1102 02/11/16 1638 02/13/16 0504  WBC 4.2 3.0* 2.4*  NEUTROABS 2.4  --  0.8*  HGB 11.8 10.1* 8.3*  HCT 35.1 30.1* 25.3*  MCV 90.8 90.4 92.3  PLT 149 116* 93*   Cardiac Enzymes:  Recent Labs Lab 02/11/16 1638 02/13/16 0504  TROPONINI <0.03 <0.03   BNP (last 3 results)  Recent Labs  02/11/16 1638  BNP 15.3    ProBNP (last 3 results) No results for input(s):  PROBNP in the last 8760 hours.  CBG: No results for input(s): GLUCAP in the last 168 hours.  Micro Recent Results (from the past 240 hour(s))  Urine Culture     Status: None   Collection Time: 02/10/16 11:02 AM  Result Value Ref Range Status   Urine Culture, Routine Final report  Final   Urine Culture result 1 No growth  Final  Culture, Blood     Status: None (Preliminary result)   Collection Time: 02/10/16 11:03 AM  Result Value Ref Range Status   BLOOD CULTURE, ROUTINE Preliminary report  Preliminary   RESULT 1 Comment  Preliminary    Comment: No growth in 36 - 48 hours.  Culture, Blood     Status: None (Preliminary result)   Collection Time: 02/10/16 11:31 AM  Result Value Ref Range Status   BLOOD CULTURE, ROUTINE Preliminary report  Preliminary   RESULT 1 Comment  Preliminary    Comment: No growth in 36 - 48 hours.  Culture, blood (Routine X 2) w Reflex to ID Panel     Status: None (Preliminary result)   Collection Time: 02/11/16  5:05 PM  Result Value Ref Range Status   Specimen Description BLOOD LEFT ANTECUBITAL  Final   Special Requests BOTTLES DRAWN AEROBIC AND ANAEROBIC 5 CC EA  Final   Culture   Final    NO GROWTH < 24 HOURS Performed at [facility]    Report Status PENDING  Incomplete  Culture, blood (Routine X 2) w Reflex to ID Panel     Status: None (Preliminary result)   Collection Time: 02/11/16  5:20 PM  Result Value Ref Range Status   Specimen Description BLOOD BLOOD LEFT FOREARM  Final   Special Requests BOTTLES DRAWN AEROBIC AND ANAEROBIC 5 CC EA  Final   Culture   Final    NO GROWTH < 24 HOURS Performed at [facility]    Report Status PENDING  Incomplete     Studies: Dg Chest 2 View  02/11/2016  CLINICAL DATA:  Left-sided chest pain and shortness of Breath EXAM: CHEST  2 VIEW COMPARISON:  02/10/2016 FINDINGS: Cardiac shadow is stable. Nodular densities are noted within both lungs consistent with the known history of lung carcinoma  with metastatic disease. Persisting consolidation is noted on the right stable from the prior study. Small bilateral pleural effusions are again seen. No new focal abnormality is noted. IMPRESSION: Stable appearance of lung carcinoma with metastatic disease bilaterally. No new acute abnormality is noted. Electronically Signed   By: Inez Catalina M.D.   On: 02/11/2016 16:52    Scheduled Meds: . ceFEPime (MAXIPIME) IV  1 g Intravenous 3 times per day  . enoxaparin (LOVENOX) injection  40 mg Subcutaneous Q24H  . feeding supplement (ENSURE ENLIVE)  237 mL Oral BID BM  . folic acid  1 mg Oral Daily  . ipratropium-albuterol  3 mL Nebulization TID  . levothyroxine  75 mcg Oral QAC breakfast  . vancomycin  750 mg Intravenous Q12H   Continuous Infusions: . sodium chloride 125 mL/hr at 02/12/16 2232  Time spent: 35 minutes    [facility]  [facility] Pager [phone] If 7PM-7AM, please contact night-coverage at [email or web address], password [facility] 02/13/2016, 11:53 AM  LOS: 2 days

## 2016-02-13 NOTE — Progress Notes (Signed)
Initial Nutrition Assessment  INTERVENTION:   -Continue Ensure Enlive po BID, each supplement provides 350 kcal and 20 grams of protein -RD to follow-up to complete assessment  NUTRITION DIAGNOSIS:   Increased nutrient needs related to cancer and cancer related treatments as evidenced by estimated needs.  GOAL:   Patient will meet greater than or equal to 90% of their needs  MONITOR:   PO intake, Supplement acceptance, Labs, Weight trends, I & O's  REASON FOR ASSESSMENT:   Malnutrition Screening Tool    ASSESSMENT:   70 y.o. 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 Has completed 2-agent systemic chemoRx from Sept '16 > Jan '17. Started on single agent Rx with Alimta on 02/03/16. Now presents with severe fatigue and gen weakness. Unable to get OOB. NO dysuria, some dry cough. NO URI symptoms. Slight SOB, +pain w inspiration. Chron nausea, no emesis. Constipated x 1 week. +fever to 100.5 max at home.  Patient in room with husband at bedside. Pt on the phone and did not end phone call to speak with RD. Spoke with husband briefly but he suggested RD come back at a later time to talk to pt. RD to follow-up to gather history from patient. Unable to perform NFPE.   Per chart, pt reports 30 lb weight loss since Spring of 2016. Per weight history, pt has recently lost 12 lb since 11/18/15 (8% wt loss x 3 months, significant for time frame). Pt was admitted with chronic nausea from previous chemo treatments for NSCLC and constipation that has lasted for 1 week. Pt has been ordered Ensure supplements BID, will assess tolerance at follow-up. PO intake: 25% of meals today. 100% documented yesterday.   Medications: folic acid tablet daily Labs reviewed.  Diet Order:  Diet Heart Room service appropriate?: Yes; Fluid consistency:: Thin  Skin:  Reviewed, no issues  Last BM:  PTA -constipation x 1  week  Height:   Ht Readings from Last 1 Encounters:  02/11/16 '5\' 9"'$  (1.753 m)    Weight:   Wt Readings from Last 1 Encounters:  02/11/16 132 lb 4.4 oz (60 kg)    Ideal Body Weight:  65.9 kg  BMI:  Body mass index is 19.52 kg/(m^2).  Estimated Nutritional Needs:   Kcal:  1800-2000  Protein:  90-100g  Fluid:  2L/day  EDUCATION NEEDS:   No education needs identified at this time  Clayton Bibles, MS, RD, LDN Pager: [phone] After Hours Pager: [phone]

## 2016-02-14 LAB — BASIC METABOLIC PANEL
ANION GAP: 7 (ref 5–15)
BUN: 5 mg/dL — ABNORMAL LOW (ref 6–20)
CALCIUM: 8.3 mg/dL — AB (ref 8.9–10.3)
CHLORIDE: 108 mmol/L (ref 101–111)
CO2: 24 mmol/L (ref 22–32)
Creatinine, Ser: 0.69 mg/dL (ref 0.44–1.00)
GFR calc non Af Amer: >60 mL/min (ref >60)
GLUCOSE: 91 mg/dL (ref 65–99)
Potassium: 3.6 mmol/L (ref 3.5–5.1)
Sodium: 139 mmol/L (ref 135–145)

## 2016-02-14 LAB — CBC WITH DIFFERENTIAL/PLATELET
Basophils Absolute: 0 10*3/uL (ref 0.0–0.1)
Basophils Relative: 0 %
Eosinophils Absolute: 0.1 10*3/uL (ref 0.0–0.7)
Eosinophils Relative: 2 %
HEMATOCRIT: 26.4 % — AB (ref 36.0–46.0)
HEMOGLOBIN: 8.8 g/dL — AB (ref 12.0–15.0)
LYMPHS ABS: 1.2 10*3/uL (ref 0.7–4.0)
LYMPHS PCT: 16 %
MCH: 31.1 pg (ref 26.0–34.0)
MCHC: 33.3 g/dL (ref 30.0–36.0)
MCV: 93.3 fL (ref 78.0–100.0)
MONO ABS: 1 10*3/uL (ref 0.1–1.0)
MONOS PCT: 13 %
NEUTROS ABS: 5.2 10*3/uL (ref 1.7–7.7)
NEUTROS PCT: 70 %
Platelets: 116 10*3/uL — ABNORMAL LOW (ref 150–400)
RBC: 2.83 MIL/uL — ABNORMAL LOW (ref 3.87–5.11)
RDW: 14 % (ref 11.5–15.5)
WBC: 7.5 10*3/uL (ref 4.0–10.5)

## 2016-02-14 MED ORDER — POLYETHYLENE GLYCOL 3350 17 G PO PACK
17.0000 g | PACK | Freq: Every day | ORAL | Status: DC
  Administered 2016-02-14: 17 g via ORAL
  Filled 2016-02-14 (×3): qty 1

## 2016-02-14 MED ORDER — LEVOFLOXACIN 750 MG PO TABS
750.0000 mg | ORAL_TABLET | Freq: Every day | ORAL | Status: DC
  Administered 2016-02-14 – 2016-02-16 (×3): 750 mg via ORAL
  Filled 2016-02-14 (×3): qty 1

## 2016-02-14 MED ORDER — SENNA 8.6 MG PO TABS
1.0000 | ORAL_TABLET | Freq: Every day | ORAL | Status: DC
  Administered 2016-02-14 – 2016-02-16 (×3): 8.6 mg via ORAL
  Filled 2016-02-14 (×3): qty 1

## 2016-02-14 NOTE — Progress Notes (Addendum)
[facility] PROGRESS NOTE   Deborah [name] ZCH:[id number] DOB: 02/27/1946 DOA: 02/11/2016 PCP: Deborah Cower, MD  HPI/Subjective: Feeling better, has decrease appetite, not eating a lot.  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 Patient has left-sided pleuritic chest pain with deep inspiration. Has mild hypoxia on admission, CT angiography done and showed no evidence of PE. Treat as HCAP was on cefepime and vancomycin. Supportive management with bronchodilators, antitussives, mucolytics and oxygen as needed. She is not on oxygen at home. Received 4 days of IV vancomycin and cefepime. Will change antibiotics to oral Levaquin today.  Needs PT, evaluation for home oxygen requirement also.  Repeat Chest x ray 2-28 to follow effusion./    NSCLC Patient has non-small cell lung cancer, status post carboplatin and Alimta chemotherapy. Follows with Deborah [name] as outpatient. CT did not show worsening/progression.  Pancytopenia This is likely secondary to chemotherapy, last maintenance chemotherapy patient received was on 02/03/2016. Follow total total WBCs 2.4 and neutrophil 0.8 Received Neupogen 1 does, 2-26 Improved.   Hx remote breast cancer , 20 yrs ago   Vol depletion - looks dry on exam, this is resolved with IV fluid hydration. NSL   Code Status: Full Code Family Communication: Plan discussed with the patient. Disposition Plan: Remains inpatient Diet: Diet Heart Room service appropriate?: Yes; Fluid consistency::  Thin  Consultants:  None  Procedures:  None  Antibiotics:  Cefepime and vancomycin.   Objective: Filed Vitals:   02/13/16 2209 02/14/16 0456  BP: 114/64 120/67  Pulse: 107 105  Temp: 98.1 F (36.7 C) 97.5 F (36.4 C)  Resp: 20 20    Intake/Output Summary (Last 24 hours) at 02/14/16 1239 Last data filed at 02/14/16 0023  Gross per 24 hour  Intake 1871.25 ml  Output      4 ml  Net 1867.25 ml   Filed Weights   02/11/16 2058  Weight: 60 kg (132 lb 4.4 oz)    Exam: General: Alert and awake, oriented x3, not in any acute distress. HEENT: anicteric sclera, pupils reactive to light and accommodation, EOMI CVS: S1-S2 clear, no murmur rubs or gallops Chest: clear to auscultation bilaterally, no wheezing, rales or rhonchi Abdomen: soft nontender, nondistended, normal bowel sounds, no organomegaly Extremities: no cyanosis, clubbing or edema noted bilaterally Neuro: Cranial nerves II-XII intact, no focal neurological deficits  Data Reviewed: Basic Metabolic Panel:  Recent Labs Lab 02/10/16 1103 02/11/16 1638 02/13/16 0504 02/14/16 0439  NA 135* 139 136 139  K 4.1 3.7 3.5 3.6  CL  --  104 106 108  CO2 '25 22 23 24  '$ GLUCOSE 97 82 94 91  BUN 11.'5 11 7 '$ 5*  CREATININE 0.7 0.68 0.75 0.69  CALCIUM 9.1 8.8* 8.1* 8.3*   Liver Function Tests:  Recent Labs Lab 02/10/16 1103 02/13/16 0504  AST 38* 45*  ALT 10 18  ALKPHOS 114 90  BILITOT 1.14 0.6  PROT 6.0* 4.8*  ALBUMIN 2.6* 2.3*   No results for input(s): LIPASE, AMYLASE in the last 168 hours. No results for input(s):  AMMONIA in the last 168 hours. CBC:  Recent Labs Lab 02/10/16 1102 02/11/16 1638 02/13/16 0504 02/14/16 0439  WBC 4.2 3.0* 2.4* 7.5  NEUTROABS 2.4  --  0.8* 5.2  HGB 11.8 10.1* 8.3* 8.8*  HCT 35.1 30.1* 25.3* 26.4*  MCV 90.8 90.4 92.3 93.3  PLT 149 116* 93* 116*   Cardiac Enzymes:  Recent Labs Lab 02/11/16 1638 02/13/16 0504  TROPONINI <0.03 <0.03   BNP (last 3  results)  Recent Labs  02/11/16 1638  BNP 15.3    ProBNP (last 3 results) No results for input(s): PROBNP in the last 8760 hours.  CBG: No results for input(s): GLUCAP in the last 168 hours.  Micro Recent Results (from the past 240 hour(s))  Urine Culture     Status: None   Collection Time: 02/10/16 11:02 AM  Result Value Ref Range Status   Urine Culture, Routine Final report  Final   Urine Culture result 1 No growth  Final  Culture, Blood     Status: None (Preliminary result)   Collection Time: 02/10/16 11:03 AM  Result Value Ref Range Status   BLOOD CULTURE, ROUTINE Preliminary report  Preliminary   RESULT 1 Comment  Preliminary    Comment: No growth in 36 - 48 hours.  Culture, Blood     Status: None (Preliminary result)   Collection Time: 02/10/16 11:31 AM  Result Value Ref Range Status   BLOOD CULTURE, ROUTINE Preliminary report  Preliminary   RESULT 1 Comment  Preliminary    Comment: No growth in 36 - 48 hours.  Culture, blood (Routine X 2) w Reflex to ID Panel     Status: None (Preliminary result)   Collection Time: 02/11/16  5:05 PM  Result Value Ref Range Status   Specimen Description BLOOD LEFT ANTECUBITAL  Final   Special Requests BOTTLES DRAWN AEROBIC AND ANAEROBIC 5 CC EA  Final   Culture   Final    NO GROWTH 2 DAYS Performed at [facility]    Report Status PENDING  Incomplete  Culture, blood (Routine X 2) w Reflex to ID Panel     Status: None (Preliminary result)   Collection Time: 02/11/16  5:20 PM  Result Value Ref Range Status   Specimen Description BLOOD BLOOD LEFT FOREARM  Final   Special Requests BOTTLES DRAWN AEROBIC AND ANAEROBIC 5 CC EA  Final   Culture   Final    NO GROWTH 2 DAYS Performed at [facility]    Report Status PENDING  Incomplete     Studies: No results found.  Scheduled Meds: . ceFEPime (MAXIPIME) IV  1 g Intravenous 3 times per day  . enoxaparin (LOVENOX) injection  40 mg Subcutaneous Q24H  . feeding  supplement (ENSURE ENLIVE)  237 mL Oral BID BM  . folic acid  1 mg Oral Daily  . ipratropium-albuterol  3 mL Nebulization TID  . levothyroxine  75 mcg Oral QAC breakfast  . vancomycin  750 mg Intravenous Q12H   Continuous Infusions: . sodium chloride 75 mL/hr at 02/14/16 7169       Time spent: 35 minutes    Deborah [name] A  [facility] Pager [phone] If 7PM-7AM, please contact night-coverage at [email or web address], password [facility] 02/14/2016, 12:39 PM  LOS: 3 days

## 2016-02-14 NOTE — Progress Notes (Signed)
Pharmacy Antibiotic Note  Deborah [name] is a 70 y.o. female admitted on 02/11/2016 with pneumonia.  Her PMH is significant for Lung Cancer and is currently on maintenance chemo regimen with Alimta.  Pharmacy has been consulted for Vancomycin dosing and antibiotic renal dose adjustment for Cefepime.  Plan:  Vancomycin '750mg'$  IV every 12 hours.  Goal trough 15-20 mcg/mL.  Continue Cefepime 1gm IV q8h   Measure Vanc trough at steady state if continued > 4 days.  Follow up renal fxn, culture results, and clinical course.  Follow up antibiotic de-escalation.   Height: '5\' 9"'$  (175.3 cm) Weight: 132 lb 4.4 oz (60 kg) IBW/kg (Calculated) : 66.2  Temp (24hrs), Avg:97.8 F (36.6 C), Min:97.5 F (36.4 C), Max:98.1 F (36.7 C)   Recent Labs Lab 02/10/16 1102 02/10/16 1103 02/11/16 1638 02/11/16 1650 02/13/16 0504 02/14/16 0439  WBC 4.2  --  3.0*  --  2.4* 7.5  CREATININE  --  0.7 0.68  --  0.75 0.69  LATICACIDVEN  --   --   --  1.37  --   --     Estimated Creatinine Clearance: 62.9 mL/min (by C-G formula based on Cr of 0.69).    No Known Allergies  Antimicrobials this admission: 2/24 Cefepime >>  2/24 Vanc >>   Dose adjustments this admission:  Microbiology results: 2/23 BCx: ngtd  2/23 Ucx: NGF 2/24 BCx: ngtd 2/25 Strep Ag: neg  Thank you for allowing pharmacy to be a part of this patient's care.  Gretta Arab PharmD, BCPS Pager [phone] 02/14/2016 9:27 AM

## 2016-02-15 ENCOUNTER — Inpatient Hospital Stay (HOSPITAL_COMMUNITY): Payer: Medicare Other

## 2016-02-15 LAB — CBC
HEMATOCRIT: 25.2 % — AB (ref 36.0–46.0)
HEMOGLOBIN: 8.6 g/dL — AB (ref 12.0–15.0)
MCH: 31.6 pg (ref 26.0–34.0)
MCHC: 34.1 g/dL (ref 30.0–36.0)
MCV: 92.6 fL (ref 78.0–100.0)
PLATELETS: 113 10*3/uL — AB (ref 150–400)
RBC: 2.72 MIL/uL — AB (ref 3.87–5.11)
RDW: 13.9 % (ref 11.5–15.5)
WBC: 12.1 10*3/uL — AB (ref 4.0–10.5)

## 2016-02-15 LAB — BASIC METABOLIC PANEL
Anion gap: 6 (ref 5–15)
CALCIUM: 8.6 mg/dL — AB (ref 8.9–10.3)
CHLORIDE: 108 mmol/L (ref 101–111)
CO2: 27 mmol/L (ref 22–32)
CREATININE: 0.93 mg/dL (ref 0.44–1.00)
GFR calc non Af Amer: >60 mL/min (ref >60)
GLUCOSE: 98 mg/dL (ref 65–99)
Potassium: 4.1 mmol/L (ref 3.5–5.1)
Sodium: 141 mmol/L (ref 135–145)

## 2016-02-15 MED ORDER — FUROSEMIDE 10 MG/ML IJ SOLN
40.0000 mg | Freq: Once | INTRAMUSCULAR | Status: AC
  Administered 2016-02-15: 40 mg via INTRAVENOUS
  Filled 2016-02-15: qty 4

## 2016-02-15 NOTE — Progress Notes (Addendum)
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 Patient has left-sided pleuritic chest pain with deep inspiration. Has mild hypoxia on admission, CT angiography done and showed no evidence of PE. Treat as HCAP was on cefepime and vancomycin. 4 days, now on Levaquin Supportive management with bronchodilators, antitussives, mucolytics and oxygen as needed. She is not on oxygen at home. Needs PT, evaluation for home oxygen requirement also.  Repeat chest x-ray today shows worsened right upper and left lower lobe airspace disease and small left pleural effusion, most likely secondary to volume overload, will give 1 dose of Lasix 1 Out of bed to chair   NSCLC Patient has non-small cell lung cancer, status post carboplatin and Alimta chemotherapy. Follows with Dr. Julien Nordmann as outpatient. CT did not show worsening/progression.  Pancytopenia This is likely secondary to chemotherapy, last maintenance chemotherapy patient received was on 02/03/2016. Follow total total WBCs 2.4 and neutrophil 0.8 Received Neupogen 1 does, 2-26 Oncology following, patient states Dr. Julien Nordmann saw the patient 2/27   Hx remote breast cancer , 20 yrs ago   Vol depletion - looks dry on exam, this is resolved with  IV fluid hydration. NSL   Code Status: Full Code Family Communication: Plan discussed with the patient. Disposition Plan: PT OT consultation, anticipate discharge tomorrow  Diet: Diet Heart Room service appropriate?: Yes; Fluid consistency:: Thin  Consultants:  None  Procedures:  None  Antibiotics:  Cefepime and vancomycin.   Objective: Filed Vitals:   02/14/16 2052 02/15/16 0548  BP: 114/66 91/50  Pulse: 105 92  Temp: 98.6 F (37 C) 98.5 F (36.9 C)  Resp: 18 18    Intake/Output Summary (Last 24 hours) at 02/15/16 0919 Last data filed at 02/15/16 0900  Gross per 24 hour  Intake    600 ml  Output      1 ml  Net    599 ml   Filed Weights   02/11/16 2058  Weight: 60 kg (132 lb 4.4 oz)    Exam: General: Alert and awake, oriented x3, not in any acute distress. HEENT: anicteric sclera, pupils reactive to light and accommodation, EOMI CVS: S1-S2 clear, no murmur rubs or gallops Chest: clear to auscultation bilaterally, no wheezing, rales or rhonchi Abdomen: soft nontender, nondistended, normal bowel sounds, no organomegaly Extremities: no cyanosis, clubbing or edema noted bilaterally Neuro: Cranial nerves II-XII intact, no focal neurological deficits  Data Reviewed: Basic Metabolic Panel:  Recent Labs Lab 02/10/16 1103 02/11/16 1638 02/13/16 0504 02/14/16 0439 02/15/16 0419  NA 135* 139 136 139 141  K 4.1 3.7 3.5 3.6 4.1  CL  --  104 106 108 108  CO2 '25 22 23 24 27  '$ GLUCOSE 97 82 94 91 98  BUN 11.'5 11 7 '$ 5* <5*  CREATININE 0.7 0.68 0.75 0.69 0.93  CALCIUM  9.1 8.8* 8.1* 8.3* 8.6*   Liver Function Tests:  Recent Labs Lab 02/10/16 1103 02/13/16 0504  AST 38* 45*  ALT 10 18  ALKPHOS 114 90  BILITOT 1.14 0.6  PROT 6.0* 4.8*  ALBUMIN 2.6* 2.3*   No results for input(s): LIPASE, AMYLASE in the last 168 hours. No results for input(s): AMMONIA in the last 168 hours. CBC:  Recent Labs Lab 02/10/16 1102 02/11/16 1638 02/13/16 0504  02/14/16 0439 02/15/16 0419  WBC 4.2 3.0* 2.4* 7.5 12.1*  NEUTROABS 2.4  --  0.8* 5.2  --   HGB 11.8 10.1* 8.3* 8.8* 8.6*  HCT 35.1 30.1* 25.3* 26.4* 25.2*  MCV 90.8 90.4 92.3 93.3 92.6  PLT 149 116* 93* 116* 113*   Cardiac Enzymes:  Recent Labs Lab 02/11/16 1638 02/13/16 0504  TROPONINI <0.03 <0.03   BNP (last 3 results)  Recent Labs  02/11/16 1638  BNP 15.3    ProBNP (last 3 results) No results for input(s): PROBNP in the last 8760 hours.  CBG: No results for input(s): GLUCAP in the last 168 hours.  Micro Recent Results (from the past 240 hour(s))  Urine Culture     Status: None   Collection Time: 02/10/16 11:02 AM  Result Value Ref Range Status   Urine Culture, Routine Final report  Final   Urine Culture result 1 No growth  Final  Culture, Blood     Status: None (Preliminary result)   Collection Time: 02/10/16 11:03 AM  Result Value Ref Range Status   BLOOD CULTURE, ROUTINE Preliminary report  Preliminary   RESULT 1 Comment  Preliminary    Comment: No growth in 36 - 48 hours.  Culture, Blood     Status: None (Preliminary result)   Collection Time: 02/10/16 11:31 AM  Result Value Ref Range Status   BLOOD CULTURE, ROUTINE Preliminary report  Preliminary   RESULT 1 Comment  Preliminary    Comment: No growth in 36 - 48 hours.  Culture, blood (Routine X 2) w Reflex to ID Panel     Status: None (Preliminary result)   Collection Time: 02/11/16  5:05 PM  Result Value Ref Range Status   Specimen Description BLOOD LEFT ANTECUBITAL  Final   Special Requests BOTTLES DRAWN AEROBIC AND ANAEROBIC 5 CC EA  Final   Culture   Final    NO GROWTH 3 DAYS Performed at [facility]    Report Status PENDING  Incomplete  Culture, blood (Routine X 2) w Reflex to ID Panel     Status: None (Preliminary result)   Collection Time: 02/11/16  5:20 PM  Result Value Ref Range Status   Specimen Description BLOOD BLOOD LEFT FOREARM  Final   Special Requests BOTTLES DRAWN  AEROBIC AND ANAEROBIC 5 CC EA  Final   Culture   Final    NO GROWTH 3 DAYS Performed at [facility]    Report Status PENDING  Incomplete     Studies: Dg Chest 2 View  02/15/2016  CLINICAL DATA:  History of pneumonia and lung cancer. Subsequent encounter. EXAM: CHEST  2 VIEW COMPARISON:  CT chest 02/10/2016.  PA and lateral chest 02/11/2016. FINDINGS: Since the most recent plain films, there has been increase in a small left pleural effusion and worsened left basilar airspace disease. Smaller right effusion is unchanged. Innumerable bilateral pulmonary nodules are again seen. Airspace disease in the right upper lobe has worsened. Right hilar masslike opacity is unchanged. IMPRESSION: Worsened right upper and left  lower lobe airspace disease and increased small left pleural effusion. No change in a small right pleural effusion and innumerable bilateral pulmonary nodules. Electronically Signed   By: Inge Rise M.D.   On: 02/15/2016 08:24    Scheduled Meds: . enoxaparin (LOVENOX) injection  40 mg Subcutaneous Q24H  . feeding supplement (ENSURE ENLIVE)  237 mL Oral BID BM  . folic acid  1 mg Oral Daily  . ipratropium-albuterol  3 mL Nebulization TID  . levofloxacin  750 mg Oral Daily  . levothyroxine  75 mcg Oral QAC breakfast  . polyethylene glycol  17 g Oral Daily  . senna  1 tablet Oral Daily   Continuous Infusions:       Time spent: 35 minutes    [facility]  [facility] Pager [phone] If 7PM-7AM, please contact night-coverage at [email or web address], password [facility] 02/15/2016, 9:19 AM  LOS: 4 days

## 2016-02-15 NOTE — Evaluation (Signed)
  Occupational Therapy Evaluation Patient Details Name: Deborah [name] MRN: [id number] DOB: 04/21/1946 Today's Date: Feb 18, 2016    History of Present Illness Pt admitted with SOB   Clinical Impression   Pt admitted with dyspnea. Pt currently with functional limitations due to the deficits listed below (see OT Problem List).  Pt will benefit from skilled OT to increase their safety and independence with ADL and functional mobility for ADL to facilitate discharge to venue listed below.      Follow Up Recommendations  No OT follow up    Equipment Recommendations  None recommended by OT       Precautions / Restrictions Precautions Precaution Comments: sats and HR      Mobility Bed Mobility Overal bed mobility: Independent                Transfers Overall transfer level: Needs assistance   Transfers: Sit to/from Stand;Stand Pivot Transfers Sit to Stand: Min guard Stand pivot transfers: Min guard                 ADL Overall ADL's : Needs assistance/impaired Eating/Feeding: Set up;Sitting   Grooming: Set up       Lower Body Bathing: Minimal assistance;Sit to/from stand   Upper Body Dressing : Set up;Sitting   Lower Body Dressing: Minimal assistance;Sit to/from stand   Toilet Transfer: Minimal assistance;Regular Toilet;Cueing for sequencing   Toileting- Clothing Manipulation and Hygiene: Minimal assistance                         Pertinent Vitals/Pain Pain Assessment: No/denies pain     Hand Dominance     Extremity/Trunk Assessment Upper Extremity Assessment Upper Extremity Assessment: Generalized weakness           Communication Communication Communication: No difficulties   Cognition Arousal/Alertness: Awake/alert Behavior During Therapy: WFL for tasks assessed/performed Overall Cognitive Status: Within Functional Limits for tasks assessed                                Home Living Family/patient expects  to be discharged to:: Private residence Living Arrangements: Spouse/significant other Available Help at Discharge: Family Type of Home: House Home Access: Stairs to enter     Home Layout: Two level                          Prior Functioning/Environment Level of Independence: Independent             OT Diagnosis: Generalized weakness   OT Problem List: Decreased strength;Decreased activity tolerance   OT Treatment/Interventions: Self-care/ADL training;Energy conservation;Patient/family education    OT Goals(Current goals can be found in the care plan section) Acute Rehab OT Goals Patient Stated Goal: not need oxygen OT Goal Formulation: With patient Time For Goal Achievement: 02/22/16  OT Frequency: Min 2X/week              End of Session Nurse Communication: Mobility status  Activity Tolerance: Patient limited by fatigue Patient left: in bed;with call bell/phone within reach   Time: 1143-1202 OT Time Calculation (min): 19 min Charges:  OT General Charges $OT Visit: 1 Procedure OT Evaluation $OT Eval Low Complexity: 1 Procedure G-Codes:    Betsy Pries 2016/02/18, 12:11 PM

## 2016-02-15 NOTE — Care Management Important Message (Signed)
Important Message  Patient Details  Name: Deborah [name] MRN: [id number] Date of Birth: March 21, 1946   Medicare Important Message Given:  Yes    Oliwia, Berzins 02/15/2016, 1:18 [place] Message  Patient Details  Name: Deborah [name] MRN: [id number] Date of Birth: 1946/04/16   Medicare Important Message Given:  Yes    Asuna, Peth 02/15/2016, 1:18 PM

## 2016-02-15 NOTE — Progress Notes (Signed)
Physical Therapy Evaluation Patient Details Name: Deborah [name] MRN: [id number] DOB: November 09, 1946 Today's Date: 02/15/2016   History of Present Illness  Pt admitted with SOB and with hx of Vertigo and Lung Ca - currently on chemo  Clinical Impression  Pt admitted as above and presenting with functional mobility limitations 2* generalized weakness and gait instability.  Pt plans dc to home with family assist and would benefit from follow up [place].    Follow Up Recommendations Home health PT    Equipment Recommendations  Rolling walker with 5" wheels    Recommendations for Other Services       Precautions / Restrictions Precautions Precautions: Fall Precaution Comments: sats and HR Restrictions Weight Bearing Restrictions: No      Mobility  Bed Mobility Overal bed mobility: Independent                Transfers Overall transfer level: Needs assistance Equipment used: None Transfers: Sit to/from Stand Sit to Stand: Min guard         General transfer comment: cues for use of UEs and min guard to steady with initial standing  Ambulation/Gait Ambulation/Gait assistance: Min assist Ambulation Distance (Feet): 400 Feet Assistive device: None;Rolling walker (2 wheeled) Gait Pattern/deviations: Step-through pattern;Decreased step length - right;Decreased step length - left;Shuffle;Trunk flexed Gait velocity: decr Gait velocity interpretation: Below normal speed for age/gender General Gait Details: Pt ambulated 10' with min HHA and additional 325 with RW and min guard.  Pt reports feeling much more secure with use of RW  Stairs            Wheelchair Mobility    Modified Rankin (Stroke Patients Only)       Balance Overall balance assessment: Needs assistance Sitting-balance support: No upper extremity supported;Feet supported Sitting balance-Leahy Scale: Good     Standing balance support: No upper extremity supported Standing balance-Leahy Scale:  Fair                               Pertinent Vitals/Pain Pain Assessment: No/denies pain    Home Living Family/patient expects to be discharged to:: Private residence Living Arrangements: Spouse/significant other Available Help at Discharge: Family Type of Home: House Home Access: Stairs to enter     Home Layout: Two level Home Equipment: None      Prior Function Level of Independence: Independent         Comments: Pt states she ltd times she went up/down stairs     Hand Dominance        Extremity/Trunk Assessment   Upper Extremity Assessment: Generalized weakness           Lower Extremity Assessment: Generalized weakness      Cervical / Trunk Assessment: Normal  Communication   Communication: No difficulties  Cognition Arousal/Alertness: Awake/alert Behavior During Therapy: WFL for tasks assessed/performed Overall Cognitive Status: Within Functional Limits for tasks assessed                      General Comments      Exercises        Assessment/Plan    PT Assessment Patient needs continued PT services  PT Diagnosis Difficulty walking   PT Problem List Decreased strength;Decreased activity tolerance;Decreased balance;Decreased mobility;Decreased knowledge of use of DME  PT Treatment Interventions DME instruction;Gait training;Stair training;Functional mobility training;Therapeutic activities;Therapeutic exercise;Balance training;Patient/family education   PT Goals (Current goals can be found in the Care  Plan section) Acute Rehab PT Goals Patient Stated Goal: not need oxygen PT Goal Formulation: With patient Time For Goal Achievement: 02/26/16 Potential to Achieve Goals: Good    Frequency Min 3X/week   Barriers to discharge        Co-evaluation               End of Session Equipment Utilized During Treatment: Gait belt;Oxygen Activity Tolerance: Patient tolerated treatment well Patient left: in bed;with call  bell/phone within reach Nurse Communication: Mobility status         Time: 1313-1330 PT Time Calculation (min) (ACUTE ONLY): 17 min   Charges:   PT Evaluation $PT Eval Low Complexity: 1 Procedure     PT G Codes:        Kerith Sherley March 02, 2016, 5:50 PM

## 2016-02-16 DIAGNOSIS — J9621 Acute and chronic respiratory failure with hypoxia: Secondary | ICD-10-CM

## 2016-02-16 LAB — CBC
HEMATOCRIT: 27.7 % — AB (ref 36.0–46.0)
Hemoglobin: 9.1 g/dL — ABNORMAL LOW (ref 12.0–15.0)
MCH: 30.8 pg (ref 26.0–34.0)
MCHC: 32.9 g/dL (ref 30.0–36.0)
MCV: 93.9 fL (ref 78.0–100.0)
Platelets: 151 10*3/uL (ref 150–400)
RBC: 2.95 MIL/uL — ABNORMAL LOW (ref 3.87–5.11)
RDW: 14.8 % (ref 11.5–15.5)
WBC: 12.7 10*3/uL — AB (ref 4.0–10.5)

## 2016-02-16 LAB — CULTURE, BLOOD (ROUTINE X 2)
Culture: NO GROWTH
Culture: NO GROWTH

## 2016-02-16 LAB — COMPREHENSIVE METABOLIC PANEL
ALT: 22 U/L (ref 14–54)
ANION GAP: 8 (ref 5–15)
AST: 44 U/L — ABNORMAL HIGH (ref 15–41)
Albumin: 2.5 g/dL — ABNORMAL LOW (ref 3.5–5.0)
Alkaline Phosphatase: 109 U/L (ref 38–126)
BILIRUBIN TOTAL: 0.6 mg/dL (ref 0.3–1.2)
BUN: 7 mg/dL (ref 6–20)
CALCIUM: 9 mg/dL (ref 8.9–10.3)
CO2: 30 mmol/L (ref 22–32)
Chloride: 102 mmol/L (ref 101–111)
Creatinine, Ser: 0.83 mg/dL (ref 0.44–1.00)
GFR calc Af Amer: >60 mL/min (ref >60)
Glucose, Bld: 98 mg/dL (ref 65–99)
POTASSIUM: 3.8 mmol/L (ref 3.5–5.1)
Sodium: 140 mmol/L (ref 135–145)
TOTAL PROTEIN: 5 g/dL — AB (ref 6.5–8.1)

## 2016-02-16 LAB — CULTURE, BLOOD (SINGLE)

## 2016-02-16 MED ORDER — SENNA 8.6 MG PO TABS: 1.0000 | ORAL_TABLET | Freq: Every day | ORAL | Status: DC

## 2016-02-16 MED ORDER — GUAIFENESIN ER 600 MG PO TB12: 1200.0000 mg | ORAL_TABLET | Freq: Two times a day (BID) | ORAL | Status: DC

## 2016-02-16 MED ORDER — POLYETHYLENE GLYCOL 3350 17 G PO PACK: 17.0000 g | PACK | Freq: Every day | ORAL | Status: DC | PRN

## 2016-02-16 MED ORDER — ENSURE ENLIVE PO LIQD: 237.0000 mL | Freq: Two times a day (BID) | ORAL | Status: DC

## 2016-02-16 MED ORDER — IPRATROPIUM-ALBUTEROL 0.5-2.5 (3) MG/3ML IN SOLN: 3.0000 mL | Freq: Three times a day (TID) | RESPIRATORY_TRACT | Status: DC

## 2016-02-16 MED ORDER — LEVOFLOXACIN 750 MG PO TABS: 750.0000 mg | ORAL_TABLET | Freq: Every day | ORAL | Status: DC

## 2016-02-16 NOTE — Progress Notes (Addendum)
SATURATION QUALIFICATIONS: (This note is used to comply with regulatory documentation for home oxygen)  Patient Saturations on Room Air at Rest = 85%   

## 2016-02-16 NOTE — Clinical Documentation Improvement (Signed)
Internal Medicine  Oncology  Please clarify if Pneumonia is:  Please document response in next progress note NOT in BPA drop down box.   Post Obstructive Pneumonia related to Lung Cancer  HCAP unrelated to Lung Cancer   Other  Clinically Undetermined  Supporting Information:  "Underlying mass is difficult to discretely separate from the consolidation" - documented in Radiology report CT 02/10/16.   "There are small bilateral pleural effusions, right is stable, left is larger compared to prior CT" - 2/23 CT report  Patient with malignant pleural effusions  Please exercise your independent, professional judgment when responding. A specific answer is not anticipated or expected.  Thank You, Zoila Shutter RN, BSN, [place] [phone]; Cell: [phone]

## 2016-02-16 NOTE — Progress Notes (Signed)
Pt declined [place] at this present time.

## 2016-02-16 NOTE — Progress Notes (Signed)
PT demonstrated verbal and hands on understanding of Flutter device. 

## 2016-02-16 NOTE — Progress Notes (Signed)
[facility] is providing the following services: Oxygen, Nebulizer, Rolling Walker (all to be delivered to patient's home today) 2 travel tanks delivered to the hospital for transport home.    If patient discharges after hours, please call [phone].   Deborah [name] 02/16/2016, 3:57 PM

## 2016-02-16 NOTE — Clinical Documentation Improvement (Signed)
Internal Medicine Oncology  Can the degree or severity of Malnutrition be further specified? Please document findings in next progress note NOT in BPA drop down box. Thank you!   Document Severity - Severe(third degree), Moderate (second degree), Mild (first degree)  Other condition  Unable to clinically determine  Document any associated diagnoses/conditions  Supporting Information: :   Being treated for Lung Cancer  Lost 12 pounds (8% wt loss) since 11/18/15  Patient is 5'9" tall and weighs 132 pounds; BMI is 19.5  Supplements recommended by Nutrition: Ensure Enlive PO BID  Please exercise your independent, professional judgment when responding. A specific answer is not anticipated or expected.  Thank You, Zoila Shutter RN, BSN, [place] [phone]; Cell: [phone]

## 2016-02-16 NOTE — Discharge Summary (Addendum)
Deborah [name], is a 70 y.o. female  DOB [birth year]  MRN [id number].  Admission date:  02/11/2016  Admitting Physician  No admitting provider for patient encounter.  Discharge Date:  02/16/2016   Primary MD  [name], MD  Recommendations for primary care physician for things to follow:  - check CBC, BMP during next visit - Repeat 2 view chest x-ray your next visit - Patient encouraged to continue pulmonary toilet, incentive spirometer, and flutter valve, and respiratory care at home,.  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 Allergy     Mold  . Hyperlipidemia 08/04/2011  . Hypothyroidism 08/04/2011  . Atrophic vaginitis   . Osteopenia 10/2012    T score -1.7 FRAX 7%/0.3%  . Vertigo   . Shortness of breath dyspnea   . Meniere's disease   . Encounter for antineoplastic chemotherapy 10/28/2015  . 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 Basal cell carcinoma of skin 2010  . Non-small cell lung cancer [facility]) September 2016    Adenocarcinoma  . Malignant neoplasm of right upper lobe of lung ([place]) 08/26/2015    Past Surgical History  Procedure Laterality Date  . Tonsillectomy  1952  . Vaginal hysterectomy  1980  . Breast biopsy  1999    RADIATION TREATMENTS  . Breast lumpectomy Right 1998    right  . Chest tube insertion Right 09/15/2015    Procedure: INSERTION RIGHT PLEURAL DRAINAGE CATHETER;  Surgeon: Melrose Nakayama, MD;  Location: [place];  Service: Thoracic;  Laterality: Right;       History of present illness and  Hospital Course:     Kindly see H&P for history of present illness and admission details, please review complete Labs, Consult reports and Test reports for all details in brief  HPI  from the history and physical done on the day of admission 02/11/2016 Deborah [name] is a 70 y.o. 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 Has completed 2-agent systemic chemoRx from Sept '16 > Jan '17. Started on single agent Rx with Alimta on 02/03/16. Now presents with severe fatigue and gen weakness. Unable to get OOB. NO dysuria, some dry cough. NO URI symptoms. Slight SOB, +pain w inspiration. Chron nausea, no emesis. Constipated x 1 week. +fever to 100.5 max at home. Seen by Onc NP today and sent for CTA of chest which showed no PE, small  bilat effusions, diffuse basilar lung nodules; areas of consolidation w some air bronchograms in RUL/ RML and RLL but not sig changed from prior.   In ED on 2L O2 sats are 91-93 %. BP 108/60, RR 16, temp 98.5 HR 105.   Na 139 K 3.7 CL 104 CO2 22 BUN 11 Creat 0.68 Ca 8.8 . WBC 3.0 (down) Hb 10.1 plt 116.   LFT's done yesterday showed alb 2.6, AST / ALT essentially wnl. Tbili 1.14.     Hospital Course   HCAP Patient presented to the hospital with subjective fever, SOB, cough and sputum production. Patient has left-sided pleuritic chest pain with deep inspiration. Has mild hypoxia on admission, CT angiography done and showed no evidence of PE. Treat as HCAP was on cefepime and vancomycin. 4 days, now on Levaquin, finished another 3 days as an outpatient Supportive management with bronchodilators, antitussives, mucolytics , flutter valve and incentive spirometer  Acute hypoxic respiratory failure - Secondary to [facility] AP and lung cancer -  Patient will be discharged on 3 L nasal cannula   NSCLC Patient has non-small cell lung cancer, status post carboplatin and Alimta chemotherapy. Follows with Dr. Julien Nordmann as outpatient. CT did not show worsening/progression.  Pancytopenia This is likely secondary to chemotherapy, last maintenance chemotherapy patient received was on 02/03/2016. Follow total total WBCs 2.4 and neutrophil 0.8 Received Neupogen 1 does, 2-26 Discussed with oncology Dr. Earlie Server. Discharge  Hx remote breast cancer , 20 yrs ago   Protein calorie malnutrition - Continue with supplement  Discharge Condition:  stable   Follow UP  Follow-up Information    Follow up with [name], MD. Schedule an appointment as soon as possible for a visit in 1 week.   Specialties:  Internal Medicine, Radiology   Contact information:   [place] [address] [phone]         Discharge Instructions  and  Discharge Medications     Discharge Instructions    Diet - low sodium heart healthy    Complete by:  As directed      Discharge instructions    Complete by:  As directed   Follow with Primary MD [name], MD in 7 days   Get CBC, CMP, 2 view Chest X ray checked  by Primary MD next visit.    Activity: As tolerated with Full fall precautions use walker/cane & assistance as needed   Disposition Home    Diet: Heart Healthy  , with feeding assistance and aspiration precautions.  For Heart failure patients - Check your Weight same time everyday, if you gain over 2 pounds, or you develop in leg swelling, experience more shortness of breath or chest pain, call your Primary MD immediately. Follow Cardiac Low Salt Diet and 1.5 lit/day fluid restriction.   On your next visit with your primary care physician please Get Medicines reviewed and adjusted.   Please request your Prim.MD to go over all Hospital Tests and Procedure/Radiological results at the follow up, please get all  Hospital records sent to your Prim MD by signing hospital release before you go home.   If you experience worsening of your admission symptoms, develop shortness of breath, life threatening emergency, suicidal or homicidal thoughts you must seek medical attention immediately by calling 911 or calling your MD immediately  if symptoms less severe.  You Must read complete instructions/literature along with all the possible adverse reactions/side effects for all the Medicines you take and that have  been prescribed to you. Take any new Medicines after you have completely understood and accpet all the possible adverse reactions/side effects.   Do not drive, operating heavy machinery, perform activities at heights, swimming or participation in water activities or provide baby sitting services if your were admitted for syncope or siezures until you have seen by Primary MD or a Neurologist and advised to do so again.  Do not drive when taking Pain medications.    Do not take more than prescribed Pain, Sleep and Anxiety Medications  Special Instructions: If you have smoked or chewed Tobacco  in the last 2 yrs please stop smoking, stop any regular Alcohol  and or any Recreational drug use.  Wear Seat belts while driving.   Please note  You were cared for by a hospitalist during your hospital stay. If you have any questions about your discharge medications or the care you received while you were in the hospital after you are discharged, you can call the unit and asked to speak with the hospitalist on call if the hospitalist that took care of you is not available. Once you are discharged, your primary care physician will handle any further medical issues. Please note that NO REFILLS for any discharge medications will be authorized once you are discharged, as it is imperative that you return to your primary care physician (or establish a relationship with a primary care physician if you do not have one) for  your aftercare needs so that they can reassess your need for medications and monitor your lab values.     Increase activity slowly    Complete by:  As directed             Medication List    STOP taking these medications        ciprofloxacin 500 MG tablet  Commonly known as:  CIPRO     triamterene-hydrochlorothiazide 37.5-25 MG capsule  Commonly known as:  DYAZIDE      TAKE these medications        albuterol 108 (90 Base) MCG/ACT inhaler  Commonly known as:  PROVENTIL HFA;VENTOLIN HFA  Inhale 2 puffs into the lungs every 6 (six) hours as needed for wheezing or shortness of breath.     dexamethasone 4 MG tablet  Commonly known as:  DECADRON  4 mg po bid the day before, day of and day after chemo     feeding supplement (ENSURE ENLIVE) Liqd  Take 237 mLs by mouth 2 (two) times daily between meals.     folic acid 1 MG tablet  Commonly known as:  FOLVITE  Take 1 tablet (1 mg total) by mouth daily.     guaiFENesin 600 MG 12 hr tablet  Commonly known as:  MUCINEX  Take 2 tablets (1,200 mg total) by mouth 2 (two) times daily.     HYDROcodone-homatropine 5-1.5 MG/5ML syrup  Commonly known as:  HYCODAN  Take 5 mLs by mouth every 6 (six) hours as needed for cough.     ipratropium-albuterol 0.5-2.5 (3) MG/3ML Soln  Commonly known as:  DUONEB  Take 3 mLs by nebulization 3 (three) times daily.     levofloxacin 750 MG tablet  Commonly known as:  LEVAQUIN  Take 1 tablet (750 mg total) by mouth daily.     levothyroxine 75 MCG tablet  Commonly known as:  SYNTHROID, LEVOTHROID  TAKE 1 TABLET BY MOUTH DAILY.     LORazepam 0.5 MG tablet  Commonly known as:  ATIVAN  Take  one every eight hours as needed for anxiety.  Prefers one at bedtime.     ondansetron 8 MG tablet  Commonly known as:  ZOFRAN  Take 1 tablet (8 mg total) by mouth every 8 (eight) hours as needed for nausea or vomiting.     polyethylene glycol packet  Commonly known as:  MIRALAX / GLYCOLAX  Take 17 g by  mouth daily as needed.     prochlorperazine 10 MG tablet  Commonly known as:  COMPAZINE  Take 1 tablet (10 mg total) by mouth every 6 (six) hours as needed for nausea or vomiting.     senna 8.6 MG Tabs tablet  Commonly known as:  SENOKOT  Take 1 tablet (8.6 mg total) by mouth daily.          Diet and Activity recommendation: See Discharge Instructions above   Consults obtained -  none   Major procedures and Radiology Reports - PLEASE review detailed and final reports for all details, in brief -      Dg Chest 2 View  02/15/2016  CLINICAL DATA:  History of pneumonia and lung cancer. Subsequent encounter. EXAM: CHEST  2 VIEW COMPARISON:  CT chest 02/10/2016.  PA and lateral chest 02/11/2016. FINDINGS: Since the most recent plain films, there has been increase in a small left pleural effusion and worsened left basilar airspace disease. Smaller right effusion is unchanged. Innumerable bilateral pulmonary nodules are again seen. Airspace disease in the right upper lobe has worsened. Right hilar masslike opacity is unchanged. IMPRESSION: Worsened right upper and left lower lobe airspace disease and increased small left pleural effusion. No change in a small right pleural effusion and innumerable bilateral pulmonary nodules. Electronically Signed   By: Inge Rise M.D.   On: 02/15/2016 08:24   Dg Chest 2 View  02/11/2016  CLINICAL DATA:  Left-sided chest pain and shortness of Breath EXAM: CHEST  2 VIEW COMPARISON:  02/10/2016 FINDINGS: Cardiac shadow is stable. Nodular densities are noted within both lungs consistent with the known history of lung carcinoma with metastatic disease. Persisting consolidation is noted on the right stable from the prior study. Small bilateral pleural effusions are again seen. No new focal abnormality is noted. IMPRESSION: Stable appearance of lung carcinoma with metastatic disease bilaterally. No new acute abnormality is noted. Electronically Signed   By:  Inez Catalina M.D.   On: 02/11/2016 16:52   Ct Chest W Contrast  01/19/2016  CLINICAL DATA:  Restaging of right-sided lung cancer diagnosed 9/16 with chemotherapy finishing 3 weeks ago. Breast cancer 18 years ago with right lumpectomy and radiation therapy. Restaging. EXAM: CT CHEST, ABDOMEN, AND PELVIS WITH CONTRAST TECHNIQUE: Multidetector CT imaging of the chest, abdomen and pelvis was performed following the standard protocol during bolus administration of intravenous contrast. CONTRAST:  19m OMNIPAQUE IOHEXOL 300 MG/ML  SOLN COMPARISON:  11/16/2015 FINDINGS: CT CHEST Mediastinum/Nodes: Low right jugular node is similar at 6 mm on image 8/series 2. Pretracheal node measures 8 mm on image 19/series 2 versus 12 mm on the prior. Right hilar node is similar at 1.0 cm. Aortic and branch vessel atherosclerosis. Mild cardiomegaly with no pericardial effusion. No central pulmonary embolism, on this non-dedicated study. Lungs/Pleura: Similar small right pleural effusion with right-sided pleural thickening and hyper attenuation or hyper enhancement. Parietal pleural thickening measures 7 mm today on image 57/series 2. Similar to 6 mm on the prior. Trace left pleural fluid is new. Patent endobronchial tree. Redemonstration of areas of peribronchovascular predominant interstitial thickening  and masslike consolidation throughout the right lung. Similar, but difficult to measure secondary to ill-defined nature. The index component within the central right upper lobe measures 2.7 x 1.7 cm on image 23/series 4. Compare 3.2 x 1.6 cm on the prior exam. Innumerable subpleural predominant pulmonary nodules are again identified. Some are minimally enlarged. Example medial left lower lobe at 7 mm on image 40/series 4, 5 mm the same level on the prior. index left lower lobe nodule on the prior exam measures 11 mm on image 35 series 4 today versus 10 mm on the prior. A subpleural superior segment left lower lobe 6 mm nodule on image  20/series 4 measured 4 mm on the prior (when remeasured). Musculoskeletal: No acute osseous abnormality. Anterior right second third rib sclerotic foci are unchanged and likely bone islands. CT ABDOMEN AND PELVIS Hepatobiliary: Normal liver. Normal gallbladder, without biliary ductal dilatation. Pancreas: Normal, without mass or ductal dilatation. Spleen: Normal in size, without focal abnormality. Adrenals/Urinary Tract: Normal adrenal glands. Interpolar left renal lesion is too small to characterize but likely a cyst. Normal right kidney, without hydronephrosis. Normal urinary bladder. Stomach/Bowel: Normal stomach, without wall thickening. Scattered colonic diverticula. Normal terminal ileum and appendix. Normal small bowel. Vascular/Lymphatic: Aortic and branch vessel atherosclerosis. No abdominopelvic adenopathy. Reproductive: Hysterectomy.  No adnexal mass. Other: No significant free fluid. No evidence of omental or peritoneal disease. Musculoskeletal: Tiny pelvic sclerotic lesions which are unchanged and favored to represent bone islands. Convex left lumbar spine curvature. IMPRESSION: CT CHEST IMPRESSION 1. Similar appearance of right-sided peribronchovascular tumor with areas of septal thickening and more masslike consolidation. 2. Innumerable pulmonary nodules, slightly progressive. 3. Similar to decreased thoracic adenopathy. 4. Similar small right pleural effusion with pleural thickening, suggesting malignancy. A tiny left pleural effusion is new. CT ABDOMEN AND PELVIS IMPRESSION 1. No acute process or evidence of metastatic disease in the abdomen or pelvis. 2. Hysterectomy. Electronically Signed   By: Abigail Miyamoto M.D.   On: 01/19/2016 12:39   Ct Angio Chest Pe W/cm &/or Wo Cm  02/10/2016  CLINICAL DATA:  Fever, shortness or breath and cough February 07, 2016. Patient had right lung cancer diagnosed 2016. EXAM: CT ANGIOGRAPHY CHEST WITH CONTRAST TECHNIQUE: Multidetector CT imaging of the chest was  performed using the standard protocol during bolus administration of intravenous contrast. Multiplanar CT image reconstructions and MIPs were obtained to evaluate the vascular anatomy. CONTRAST:  138m OMNIPAQUE IOHEXOL 350 MG/ML SOLN COMPARISON:  January 19, 2016 FINDINGS: There is no pulmonary embolus. There is atherosclerosis of the aorta without aneurysmal dilatation. Normal enhancement of the great vessels are noted. The heart size is mildly enlarged. There is minimal pericardial effusion. There are mild enlarge lymph nodes in the mediastinum and in the right hilum unchanged compared to prior exam. There are small bilateral pleural effusions, right is stable, left is larger compared to prior CT. There is right pleural thickening unchanged compared prior exam. There are diffuse innumerable nodules in bilateral lungs consistent with patient's known lung cancer. There are areas of consolidation some with air bronchograms involving the right upper, right middle, right lower lobe not significantly changed compared to the previous CT. Underlying mass is difficult to discretely separate from the consolidation. There are degenerative joint changes of the spine. No definite focal discrete lytic or blastic lesion is identified. Review of the MIP images confirms the above findings. IMPRESSION: No pulmonary embolus. Changes of lung cancer stable compared to prior CT of January 19, 2016. There are areas  of consolidation involving the right lung, difficult to discretely separate underlying mass from the consolidation, but this is unchanged compared to prior CT. There are bilateral pleural effusions, the left pleural effusion is slightly larger compared to prior CT. Electronically Signed   By: Abelardo Diesel M.D.   On: 02/10/2016 13:50   Ct Abdomen Pelvis W Contrast  01/19/2016  CLINICAL DATA:  Restaging of right-sided lung cancer diagnosed 9/16 with chemotherapy finishing 3 weeks ago. Breast cancer 18 years ago with right  lumpectomy and radiation therapy. Restaging. EXAM: CT CHEST, ABDOMEN, AND PELVIS WITH CONTRAST TECHNIQUE: Multidetector CT imaging of the chest, abdomen and pelvis was performed following the standard protocol during bolus administration of intravenous contrast. CONTRAST:  111m OMNIPAQUE IOHEXOL 300 MG/ML  SOLN COMPARISON:  11/16/2015 FINDINGS: CT CHEST Mediastinum/Nodes: Low right jugular node is similar at 6 mm on image 8/series 2. Pretracheal node measures 8 mm on image 19/series 2 versus 12 mm on the prior. Right hilar node is similar at 1.0 cm. Aortic and branch vessel atherosclerosis. Mild cardiomegaly with no pericardial effusion. No central pulmonary embolism, on this non-dedicated study. Lungs/Pleura: Similar small right pleural effusion with right-sided pleural thickening and hyper attenuation or hyper enhancement. Parietal pleural thickening measures 7 mm today on image 57/series 2. Similar to 6 mm on the prior. Trace left pleural fluid is new. Patent endobronchial tree. Redemonstration of areas of peribronchovascular predominant interstitial thickening and masslike consolidation throughout the right lung. Similar, but difficult to measure secondary to ill-defined nature. The index component within the central right upper lobe measures 2.7 x 1.7 cm on image 23/series 4. Compare 3.2 x 1.6 cm on the prior exam. Innumerable subpleural predominant pulmonary nodules are again identified. Some are minimally enlarged. Example medial left lower lobe at 7 mm on image 40/series 4, 5 mm the same level on the prior. index left lower lobe nodule on the prior exam measures 11 mm on image 35 series 4 today versus 10 mm on the prior. A subpleural superior segment left lower lobe 6 mm nodule on image 20/series 4 measured 4 mm on the prior (when remeasured). Musculoskeletal: No acute osseous abnormality. Anterior right second third rib sclerotic foci are unchanged and likely bone islands. CT ABDOMEN AND PELVIS  Hepatobiliary: Normal liver. Normal gallbladder, without biliary ductal dilatation. Pancreas: Normal, without mass or ductal dilatation. Spleen: Normal in size, without focal abnormality. Adrenals/Urinary Tract: Normal adrenal glands. Interpolar left renal lesion is too small to characterize but likely a cyst. Normal right kidney, without hydronephrosis. Normal urinary bladder. Stomach/Bowel: Normal stomach, without wall thickening. Scattered colonic diverticula. Normal terminal ileum and appendix. Normal small bowel. Vascular/Lymphatic: Aortic and branch vessel atherosclerosis. No abdominopelvic adenopathy. Reproductive: Hysterectomy.  No adnexal mass. Other: No significant free fluid. No evidence of omental or peritoneal disease. Musculoskeletal: Tiny pelvic sclerotic lesions which are unchanged and favored to represent bone islands. Convex left lumbar spine curvature. IMPRESSION: CT CHEST IMPRESSION 1. Similar appearance of right-sided peribronchovascular tumor with areas of septal thickening and more masslike consolidation. 2. Innumerable pulmonary nodules, slightly progressive. 3. Similar to decreased thoracic adenopathy. 4. Similar small right pleural effusion with pleural thickening, suggesting malignancy. A tiny left pleural effusion is new. CT ABDOMEN AND PELVIS IMPRESSION 1. No acute process or evidence of metastatic disease in the abdomen or pelvis. 2. Hysterectomy. Electronically Signed   By: KAbigail MiyamotoM.D.   On: 01/19/2016 12:39    Micro Results     Recent Results (from the past 240  hour(s))  Urine Culture     Status: None   Collection Time: 02/10/16 11:02 AM  Result Value Ref Range Status   Urine Culture, Routine Final report  Final   Urine Culture result 1 No growth  Final  Culture, Blood     Status: None   Collection Time: 02/10/16 11:03 AM  Result Value Ref Range Status   BLOOD CULTURE, ROUTINE Final report  Final   RESULT 1 Comment  Final    Comment: No aerobic or anaerobic  growth in five days.  Culture, Blood     Status: None   Collection Time: 02/10/16 11:31 AM  Result Value Ref Range Status   BLOOD CULTURE, ROUTINE Final report  Final   RESULT 1 Comment  Final    Comment: No aerobic or anaerobic growth in five days.  Culture, blood (Routine X 2) w Reflex to ID Panel     Status: None   Collection Time: 02/11/16  5:05 PM  Result Value Ref Range Status   Specimen Description BLOOD LEFT ANTECUBITAL  Final   Special Requests BOTTLES DRAWN AEROBIC AND ANAEROBIC 5 CC EA  Final   Culture   Final    NO GROWTH 5 DAYS Performed at [facility]    Report Status 02/16/2016 FINAL  Final  Culture, blood (Routine X 2) w Reflex to ID Panel     Status: None   Collection Time: 02/11/16  5:20 PM  Result Value Ref Range Status   Specimen Description BLOOD BLOOD LEFT FOREARM  Final   Special Requests BOTTLES DRAWN AEROBIC AND ANAEROBIC 5 CC EA  Final   Culture   Final    NO GROWTH 5 DAYS Performed at [facility]    Report Status 02/16/2016 FINAL  Final       Today   Subjective:   Deborah [name] today has no headache,no chest or abdominal pain, still complaining of dry cough, and generalized weakness .Marland Kitchen   Objective:   Blood pressure 95/65, pulse 90, temperature 97.7 F (36.5 C), temperature source Oral, resp. rate 18, height '5\' 9"'$  (1.753 m), weight 60 kg (132 lb 4.4 oz), last menstrual period 10/11/1979, SpO2 90 %.   Intake/Output Summary (Last 24 hours) at 02/16/16 1500 Last data filed at 02/16/16 0900  Gross per 24 hour  Intake    240 ml  Output      1 ml  Net    239 ml    Exam  General: Alert and awake, oriented x3, not in any acute distress. HEENT: anicteric sclera, pupils reactive to light and accommodation, EOMI CVS: S1-S2 clear, no murmur rubs or gallops Chest: Good air entry bilaterally, rhonchi and coarse in  right lung Abdomen: soft nontender, nondistended, normal bowel sounds, no organomegaly Extremities: no cyanosis,  clubbing or edema noted bilaterally Neuro: Cranial nerves II-XII intact, no focal neurological deficits Data Review   CBC w Diff: Lab Results  Component Value Date   WBC 12.7* 02/16/2016   WBC 4.2 02/10/2016   HGB 9.1* 02/16/2016   HGB 11.8 02/10/2016   HCT 27.7* 02/16/2016   HCT 35.1 02/10/2016   PLT 151 02/16/2016   PLT 149 02/10/2016   LYMPHOPCT 16 02/14/2016   LYMPHOPCT 27.1 02/10/2016   MONOPCT 13 02/14/2016   MONOPCT 13.2 02/10/2016   EOSPCT 2 02/14/2016   EOSPCT 0.7 02/10/2016   BASOPCT 0 02/14/2016   BASOPCT 0.9 02/10/2016    CMP: Lab Results  Component Value Date   NA  140 02/16/2016   NA 135* 02/10/2016   K 3.8 02/16/2016   K 4.1 02/10/2016   CL 102 02/16/2016   CO2 30 02/16/2016   CO2 25 02/10/2016   BUN 7 02/16/2016   BUN 11.5 02/10/2016   CREATININE 0.83 02/16/2016   CREATININE 0.7 02/10/2016   PROT 5.0* 02/16/2016   PROT 6.0* 02/10/2016   ALBUMIN 2.5* 02/16/2016   ALBUMIN 2.6* 02/10/2016   BILITOT 0.6 02/16/2016   BILITOT 1.14 02/10/2016   ALKPHOS 109 02/16/2016   ALKPHOS 114 02/10/2016   AST 44* 02/16/2016   AST 38* 02/10/2016   ALT 22 02/16/2016   ALT 10 02/10/2016  .   Total Time in preparing paper work, data evaluation and todays exam - 35 minutes  Airen Stiehl M.D on 02/16/2016 at 3:00 PM  [facility]   Office  [phone]

## 2016-02-16 NOTE — Discharge Instructions (Signed)
Follow with Primary MD [name], MD in 7 days   Get CBC, CMP, 2 view Chest X ray checked  by Primary MD next visit.    Activity: As tolerated with Full fall precautions use walker/cane & assistance as needed   Disposition Home    Diet: Heart Healthy  , with feeding assistance and aspiration precautions.  For Heart failure patients - Check your Weight same time everyday, if you gain over 2 pounds, or you develop in leg swelling, experience more shortness of breath or chest pain, call your Primary MD immediately. Follow Cardiac Low Salt Diet and 1.5 lit/day fluid restriction.   On your next visit with your primary care physician please Get Medicines reviewed and adjusted.   Please request your Prim.MD to go over all Hospital Tests and Procedure/Radiological results at the follow up, please get all Hospital records sent to your Prim MD by signing hospital release before you go home.   If you experience worsening of your admission symptoms, develop shortness of breath, life threatening emergency, suicidal or homicidal thoughts you must seek medical attention immediately by calling 911 or calling your MD immediately  if symptoms less severe.  You Must read complete instructions/literature along with all the possible adverse reactions/side effects for all the Medicines you take and that have been prescribed to you. Take any new Medicines after you have completely understood and accpet all the possible adverse reactions/side effects.   Do not drive, operating heavy machinery, perform activities at heights, swimming or participation in water activities or provide baby sitting services if your were admitted for syncope or siezures until you have seen by Primary MD or a Neurologist and advised to do so again.  Do not drive when taking Pain medications.    Do not take more than prescribed Pain, Sleep and Anxiety Medications  Special Instructions: If you have smoked or chewed Tobacco  in the  last 2 yrs please stop smoking, stop any regular Alcohol  and or any Recreational drug use.  Wear Seat belts while driving.   Please note  You were cared for by a hospitalist during your hospital stay. If you have any questions about your discharge medications or the care you received while you were in the hospital after you are discharged, you can call the unit and asked to speak with the hospitalist on call if the hospitalist that took care of you is not available. Once you are discharged, your primary care physician will handle any further medical issues. Please note that NO REFILLS for any discharge medications will be authorized once you are discharged, as it is imperative that you return to your primary care physician (or establish a relationship with a primary care physician if you do not have one) for your aftercare needs so that they can reassess your need for medications and monitor your lab values.

## 2016-02-17 ENCOUNTER — Ambulatory Visit: Payer: Medicare Other | Admitting: Internal Medicine

## 2016-02-17 ENCOUNTER — Other Ambulatory Visit: Payer: Medicare Other

## 2016-02-17 ENCOUNTER — Telehealth: Payer: Self-pay | Admitting: *Deleted

## 2016-02-17 NOTE — Telephone Encounter (Signed)
Called pt to get her set-up for hosp f/u w/Dr. Jenny Reichmann pt states she does not want to f/u with Dr. Jenny Reichmann she will be f/u with her oncologist next Thursday. Decline to see Dr. Vinnie Langton

## 2016-02-21 ENCOUNTER — Telehealth: Payer: Self-pay

## 2016-02-21 ENCOUNTER — Other Ambulatory Visit: Payer: Self-pay | Admitting: *Deleted

## 2016-02-21 DIAGNOSIS — C3491 Malignant neoplasm of unspecified part of right bronchus or lung: Secondary | ICD-10-CM

## 2016-02-21 DIAGNOSIS — T451X5A Adverse effect of antineoplastic and immunosuppressive drugs, initial encounter: Secondary | ICD-10-CM

## 2016-02-21 DIAGNOSIS — R110 Nausea: Secondary | ICD-10-CM

## 2016-02-21 MED ORDER — LORAZEPAM 0.5 MG PO TABS: ORAL_TABLET | ORAL | Status: AC

## 2016-02-21 NOTE — Telephone Encounter (Signed)
rx for ativan phoned into [facility] per pt request

## 2016-02-21 NOTE — Telephone Encounter (Signed)
Patient's husband called requesting a refill on patient's ativan, he states "pharmacy needs an approval".  Patient is completely out and he is requesting the refill happen today.

## 2016-02-23 ENCOUNTER — Other Ambulatory Visit: Payer: Self-pay | Admitting: *Deleted

## 2016-02-23 DIAGNOSIS — C3491 Malignant neoplasm of unspecified part of right bronchus or lung: Secondary | ICD-10-CM

## 2016-02-24 ENCOUNTER — Ambulatory Visit (HOSPITAL_BASED_OUTPATIENT_CLINIC_OR_DEPARTMENT_OTHER): Payer: Medicare Other | Admitting: Internal Medicine

## 2016-02-24 ENCOUNTER — Ambulatory Visit (HOSPITAL_BASED_OUTPATIENT_CLINIC_OR_DEPARTMENT_OTHER): Payer: Medicare Other

## 2016-02-24 ENCOUNTER — Encounter: Payer: Self-pay | Admitting: Internal Medicine

## 2016-02-24 ENCOUNTER — Other Ambulatory Visit (HOSPITAL_BASED_OUTPATIENT_CLINIC_OR_DEPARTMENT_OTHER): Payer: Medicare Other

## 2016-02-24 VITALS — BP 123/71 | HR 124 | Temp 97.7°F | Resp 19 | Ht 69.0 in | Wt 129.7 lb

## 2016-02-24 DIAGNOSIS — C3491 Malignant neoplasm of unspecified part of right bronchus or lung: Secondary | ICD-10-CM

## 2016-02-24 DIAGNOSIS — C3411 Malignant neoplasm of upper lobe, right bronchus or lung: Secondary | ICD-10-CM

## 2016-02-24 DIAGNOSIS — Z5111 Encounter for antineoplastic chemotherapy: Secondary | ICD-10-CM

## 2016-02-24 DIAGNOSIS — M899 Disorder of bone, unspecified: Secondary | ICD-10-CM | POA: Diagnosis not present

## 2016-02-24 DIAGNOSIS — E039 Hypothyroidism, unspecified: Secondary | ICD-10-CM | POA: Diagnosis not present

## 2016-02-24 LAB — COMPREHENSIVE METABOLIC PANEL
ALBUMIN: 2.6 g/dL — AB (ref 3.5–5.0)
ALK PHOS: 112 U/L (ref 40–150)
ALT: 10 U/L (ref 0–55)
AST: 49 U/L — ABNORMAL HIGH (ref 5–34)
Anion Gap: 9 mEq/L (ref 3–11)
BUN: 10.2 mg/dL (ref 7.0–26.0)
CALCIUM: 9.3 mg/dL (ref 8.4–10.4)
CHLORIDE: 108 meq/L (ref 98–109)
CO2: 24 mEq/L (ref 22–29)
Creatinine: 0.7 mg/dL (ref 0.6–1.1)
EGFR: 86 mL/min/{1.73_m2} — AB (ref >90)
GLUCOSE: 89 mg/dL (ref 70–140)
POTASSIUM: 4.1 meq/L (ref 3.5–5.1)
SODIUM: 140 meq/L (ref 136–145)
Total Bilirubin: 0.34 mg/dL (ref 0.20–1.20)
Total Protein: 5.7 g/dL — ABNORMAL LOW (ref 6.4–8.3)

## 2016-02-24 LAB — CBC WITH DIFFERENTIAL/PLATELET
BASO%: 1.3 % (ref 0.0–2.0)
BASOS ABS: 0.1 10*3/uL (ref 0.0–0.1)
EOS ABS: 0.1 10*3/uL (ref 0.0–0.5)
EOS%: 1.1 % (ref 0.0–7.0)
HCT: 33.3 % — ABNORMAL LOW (ref 34.8–46.6)
HEMOGLOBIN: 11 g/dL — AB (ref 11.6–15.9)
LYMPH%: 19.9 % (ref 14.0–49.7)
MCH: 31.1 pg (ref 25.1–34.0)
MCHC: 33.1 g/dL (ref 31.5–36.0)
MCV: 93.9 fL (ref 79.5–101.0)
MONO#: 0.7 10*3/uL (ref 0.1–0.9)
MONO%: 10.2 % (ref 0.0–14.0)
NEUT#: 4.5 10*3/uL (ref 1.5–6.5)
NEUT%: 67.5 % (ref 38.4–76.8)
Platelets: 263 10*3/uL (ref 145–400)
RBC: 3.55 10*6/uL — ABNORMAL LOW (ref 3.70–5.45)
RDW: 16.8 % — ABNORMAL HIGH (ref 11.2–14.5)
WBC: 6.7 10*3/uL (ref 3.9–10.3)
lymph#: 1.3 10*3/uL (ref 0.9–3.3)

## 2016-02-24 MED ORDER — SODIUM CHLORIDE 0.9 % IV SOLN
Freq: Once | INTRAVENOUS | Status: AC
  Administered 2016-02-24: 11:00:00 via INTRAVENOUS

## 2016-02-24 MED ORDER — PROCHLORPERAZINE MALEATE 10 MG PO TABS
10.0000 mg | ORAL_TABLET | Freq: Once | ORAL | Status: AC
  Administered 2016-02-24: 10 mg via ORAL

## 2016-02-24 MED ORDER — SODIUM CHLORIDE 0.9 % IV SOLN
500.0000 mg/m2 | Freq: Once | INTRAVENOUS | Status: AC
  Administered 2016-02-24: 875 mg via INTRAVENOUS
  Filled 2016-02-24: qty 28

## 2016-02-24 MED ORDER — UNABLE TO FIND: Status: DC

## 2016-02-24 MED ORDER — PROCHLORPERAZINE MALEATE 10 MG PO TABS
ORAL_TABLET | ORAL | Status: AC
  Filled 2016-02-24: qty 1

## 2016-02-24 NOTE — Patient Instructions (Signed)
[facility] Discharge Instructions for Patients Receiving Chemotherapy  Today you received the following chemotherapy agents; Alimta.   To help prevent nausea and vomiting after your treatment, we encourage you to take your nausea medication as directed.    If you develop nausea and vomiting that is not controlled by your nausea medication, call the clinic.   BELOW ARE SYMPTOMS THAT SHOULD BE REPORTED IMMEDIATELY:  *FEVER GREATER THAN 100.5 F  *CHILLS WITH OR WITHOUT FEVER  NAUSEA AND VOMITING THAT IS NOT CONTROLLED WITH YOUR NAUSEA MEDICATION  *UNUSUAL SHORTNESS OF BREATH  *UNUSUAL BRUISING OR BLEEDING  TENDERNESS IN MOUTH AND THROAT WITH OR WITHOUT PRESENCE OF ULCERS  *URINARY PROBLEMS  *BOWEL PROBLEMS  UNUSUAL RASH Items with * indicate a potential emergency and should be followed up as soon as possible.  Feel free to call the clinic you have any questions or concerns. The clinic phone number is [phone].  Please show the CHEMO ALERT CARD at check-in to the Emergency Department and triage nurse.   

## 2016-02-24 NOTE — Progress Notes (Signed)
[place] [phone]   [phone]  OFFICE PROGRESS NOTE  [name], MD [place] [address]  DIAGNOSIS: Stage IV (T1b, N2, M1a) non-small cell lung cancer, adenocarcinoma with negative EGFR mutation, negative ALK gene translocation, presenting with right upper lobe lung mass in addition to mediastinal lymphadenopathy and malignant pleural effusion as well as innumerable bilateral pulmonary nodules.  PRIOR THERAPY: Systemic chemotherapy with carboplatin for AUC of 5 and Alimta 500 MG/M2 every 3 weeks. First dose on 09/16/2015. Status post 6 cycles. Last dose was given 12/30/2015 with stable disease.  CURRENT THERAPY: Maintenance systemic chemotherapy with single agent Alimta 500 MG/M2 every 3 weeks. First dose 02/03/2016. Status post one cycle  INTERVAL HISTORY: Deborah [name] 70 y.o. female returns to the clinic today for follow-up visit accompanied by her husband. The patient is currently undergoing maintenance chemotherapy with single agent Alimta status post 1 cycle. Unfortunately she was recently admitted to [facility] with significant shortness breath and fever. She was treated for questionable pneumonia. Repeat CT scan of the chest during her hospitalization showed no evidence for disease progression and pulmonary embolism. The patient is feeling much better today. She denied having any fever or chills. She has no current nausea or vomiting. She denied having any significant chest pain, shortness of breath, cough or hemoptysis. She has no significant weight loss or night sweats. She is here today to start cycle #2 of her maintenance therapy.  MEDICAL HISTORY: Past Medical History  Diagnosis Date  . Allergy     Mold  . Hyperlipidemia 08/04/2011  . Hypothyroidism 08/04/2011  . Atrophic vaginitis   . Osteopenia 10/2012    T score -1.7 FRAX 7%/0.3%  . Vertigo   . Shortness of breath dyspnea   . Meniere's  disease   . Encounter for antineoplastic chemotherapy 10/28/2015  . 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 Basal cell carcinoma of skin 2010  . Non-small cell lung cancer [facility]) September 2016    Adenocarcinoma  . Malignant neoplasm of right upper lobe of lung ([place]) 08/26/2015    ALLERGIES:  has No Known Allergies.  MEDICATIONS:  Current Outpatient Prescriptions  Medication Sig Dispense Refill  . albuterol (PROVENTIL HFA;VENTOLIN HFA) 108 (90 BASE) MCG/ACT inhaler Inhale 2 puffs into the lungs every 6 (six) hours as needed for wheezing or shortness of breath. 1 Inhaler 1  . dexamethasone (DECADRON) 4 MG tablet 4 mg po bid the day before, day of and day after chemo 40 tablet 1  . feeding supplement, ENSURE ENLIVE, (ENSURE ENLIVE) LIQD Take 237 mLs by mouth 2 (two) times daily between meals. 580 mL 12  . folic acid (FOLVITE) 1 MG tablet Take 1 tablet (1 mg total) by mouth daily. 30 tablet 3  . guaiFENesin (MUCINEX) 600 MG 12 hr tablet Take 2 tablets (1,200 mg total) by mouth 2 (two) times daily. 20 tablet 0  . HYDROcodone-homatropine (HYCODAN) 5-1.5 MG/5ML syrup Take 5 mLs by mouth every 6 (six) hours as needed for cough. 120 mL 0  . ipratropium-albuterol (DUONEB) 0.5-2.5 (3) MG/3ML SOLN Take 3 mLs by nebulization 3 (three) times daily. 360 mL 1  . levofloxacin (LEVAQUIN) 750 MG tablet Take 1 tablet (750 mg total) by mouth daily. 3 tablet 0  . levothyroxine (SYNTHROID, LEVOTHROID) 75 MCG tablet TAKE 1 TABLET BY MOUTH DAILY. 90 tablet 3  .  LORazepam (ATIVAN) 0.5 MG tablet Take one every eight hours as needed for anxiety.  Prefers one at bedtime. 30 tablet 0  . ondansetron (ZOFRAN) 8 MG tablet Take 1 tablet (8 mg total) by mouth every 8 (eight) hours as needed for nausea or vomiting. 20 tablet 0  . polyethylene glycol (MIRALAX / GLYCOLAX) packet Take 17 g by mouth daily as needed. 14 each 0  . prochlorperazine (COMPAZINE) 10 MG tablet Take 1  tablet (10 mg total) by mouth every 6 (six) hours as needed for nausea or vomiting. 30 tablet 0  . senna (SENOKOT) 8.6 MG TABS tablet Take 1 tablet (8.6 mg total) by mouth daily. 120 each 0   No current facility-administered medications for this visit.    SURGICAL HISTORY:  Past Surgical History  Procedure Laterality Date  . Tonsillectomy  1952  . Vaginal hysterectomy  1980  . Breast biopsy  1999    RADIATION TREATMENTS  . Breast lumpectomy Right 1998    right  . Chest tube insertion Right 09/15/2015    Procedure: INSERTION RIGHT PLEURAL DRAINAGE CATHETER;  Surgeon: Melrose Nakayama, MD;  Location: [facility];  Service: Thoracic;  Laterality: Right;    REVIEW OF SYSTEMS:  Constitutional: positive for fatigue and weight loss Eyes: negative Ears, nose, mouth, throat, and face: negative Respiratory: positive for dyspnea on exertion Cardiovascular: negative Gastrointestinal: negative Genitourinary:negative Integument/breast: negative Hematologic/lymphatic: negative Musculoskeletal:negative Neurological: negative Behavioral/Psych: negative Endocrine: negative Allergic/Immunologic: negative   PHYSICAL EXAMINATION: General appearance: alert, cooperative, fatigued and no distress Head: Normocephalic, without obvious abnormality, atraumatic Neck: no adenopathy, no JVD, supple, symmetrical, trachea midline and thyroid not enlarged, symmetric, no tenderness/mass/nodules Lymph nodes: Cervical, supraclavicular, and axillary nodes normal. Resp: diminished breath sounds RLL and dullness to percussion RLL Back: symmetric, no curvature. ROM normal. No CVA tenderness. Cardio: regular rate and rhythm, S1, S2 normal, no murmur, click, rub or gallop GI: soft, non-tender; bowel sounds normal; no masses,  no organomegaly Extremities: extremities normal, atraumatic, no cyanosis or edema Neurologic: Alert and oriented X 3, normal strength and tone. Normal symmetric reflexes. Normal coordination and  gait  ECOG PERFORMANCE STATUS: 1 - Symptomatic but completely ambulatory  Blood pressure 123/71, pulse 124, temperature 97.7 F (36.5 C), temperature source Oral, resp. rate 19, height '5\' 9"'$  (1.753 m), weight 129 lb 11.2 oz (58.832 kg), last menstrual period 10/11/1979, SpO2 94 %.  LABORATORY DATA: Lab Results  Component Value Date   WBC 6.7 02/24/2016   HGB 11.0* 02/24/2016   HCT 33.3* 02/24/2016   MCV 93.9 02/24/2016   PLT 263 02/24/2016      Chemistry      Component Value Date/Time   NA 140 02/24/2016 0923   NA 140 02/16/2016 0430   K 4.1 02/24/2016 0923   K 3.8 02/16/2016 0430   CL 102 02/16/2016 0430   CO2 24 02/24/2016 0923   CO2 30 02/16/2016 0430   BUN 10.2 02/24/2016 0923   BUN 7 02/16/2016 0430   CREATININE 0.7 02/24/2016 0923   CREATININE 0.83 02/16/2016 0430      Component Value Date/Time   CALCIUM 9.3 02/24/2016 0923   CALCIUM 9.0 02/16/2016 0430   ALKPHOS 112 02/24/2016 0923   ALKPHOS 109 02/16/2016 0430   AST 49* 02/24/2016 0923   AST 44* 02/16/2016 0430   ALT 10 02/24/2016 0923   ALT 22 02/16/2016 0430   BILITOT 0.34 02/24/2016 0923   BILITOT 0.6 02/16/2016 0430       RADIOGRAPHIC STUDIES: Dg  Chest 2 View  02/15/2016  CLINICAL DATA:  History of pneumonia and lung cancer. Subsequent encounter. EXAM: CHEST  2 VIEW COMPARISON:  CT chest 02/10/2016.  PA and lateral chest 02/11/2016. FINDINGS: Since the most recent plain films, there has been increase in a small left pleural effusion and worsened left basilar airspace disease. Smaller right effusion is unchanged. Innumerable bilateral pulmonary nodules are again seen. Airspace disease in the right upper lobe has worsened. Right hilar masslike opacity is unchanged. IMPRESSION: Worsened right upper and left lower lobe airspace disease and increased small left pleural effusion. No change in a small right pleural effusion and innumerable bilateral pulmonary nodules. Electronically Signed   By: Inge Rise  M.D.   On: 02/15/2016 08:24   Dg Chest 2 View  02/11/2016  CLINICAL DATA:  Left-sided chest pain and shortness of Breath EXAM: CHEST  2 VIEW COMPARISON:  02/10/2016 FINDINGS: Cardiac shadow is stable. Nodular densities are noted within both lungs consistent with the known history of lung carcinoma with metastatic disease. Persisting consolidation is noted on the right stable from the prior study. Small bilateral pleural effusions are again seen. No new focal abnormality is noted. IMPRESSION: Stable appearance of lung carcinoma with metastatic disease bilaterally. No new acute abnormality is noted. Electronically Signed   By: Inez Catalina M.D.   On: 02/11/2016 16:52   Ct Angio Chest Pe W/cm &/or Wo Cm  02/10/2016  CLINICAL DATA:  Fever, shortness or breath and cough February 07, 2016. Patient had right lung cancer diagnosed 2016. EXAM: CT ANGIOGRAPHY CHEST WITH CONTRAST TECHNIQUE: Multidetector CT imaging of the chest was performed using the standard protocol during bolus administration of intravenous contrast. Multiplanar CT image reconstructions and MIPs were obtained to evaluate the vascular anatomy. CONTRAST:  157m OMNIPAQUE IOHEXOL 350 MG/ML SOLN COMPARISON:  January 19, 2016 FINDINGS: There is no pulmonary embolus. There is atherosclerosis of the aorta without aneurysmal dilatation. Normal enhancement of the great vessels are noted. The heart size is mildly enlarged. There is minimal pericardial effusion. There are mild enlarge lymph nodes in the mediastinum and in the right hilum unchanged compared to prior exam. There are small bilateral pleural effusions, right is stable, left is larger compared to prior CT. There is right pleural thickening unchanged compared prior exam. There are diffuse innumerable nodules in bilateral lungs consistent with patient's known lung cancer. There are areas of consolidation some with air bronchograms involving the right upper, right middle, right lower lobe not  significantly changed compared to the previous CT. Underlying mass is difficult to discretely separate from the consolidation. There are degenerative joint changes of the spine. No definite focal discrete lytic or blastic lesion is identified. Review of the MIP images confirms the above findings. IMPRESSION: No pulmonary embolus. Changes of lung cancer stable compared to prior CT of January 19, 2016. There are areas of consolidation involving the right lung, difficult to discretely separate underlying mass from the consolidation, but this is unchanged compared to prior CT. There are bilateral pleural effusions, the left pleural effusion is slightly larger compared to prior CT. Electronically Signed   By: WAbelardo DieselM.D.   On: 02/10/2016 13:50    ASSESSMENT AND PLAN: This is a very pleasant 70years old white female recently diagnosed with stage IV non-small cell lung cancer, adenocarcinoma with no detectable actionable mutations. She status post 6 cycles of induction chemotherapy with carboplatin and Alimta and tolerated her treatment fairly well. Restaging CT scan of the chest showed  no significant evidence for disease progression except for slight increase of the pulmonary nodule. She is currently undergoing maintenance chemotherapy with single agent Alimta status post 1 cycle. The patient related the first cycle of her treatment fairly well but she was admitted with questionable pneumonia and shortness of breath which completely resolved at this point. I recommended for her to proceed with cycle #2 today as a scheduled. The patient would come back for follow-up visit in 3 weeks for reevaluation with the start of cycle #3. The patient and her husband had several questions and I answered them completely to their satisfaction today. The patient was advised to call immediately if she has any concerning symptoms in the interval. I spent 20 minutes of face-to-face counseling with the patient and her  husband to the total visit time 30 minutes. The patient voices understanding of current disease status and treatment options and is in agreement with the current care plan.  All questions were answered. The patient knows to call the clinic with any problems, questions or concerns. We can certainly see the patient much sooner if necessary.  Disclaimer: This note was dictated with voice recognition software. Similar sounding words can inadvertently be transcribed and may not be corrected upon review.

## 2016-02-29 ENCOUNTER — Ambulatory Visit (HOSPITAL_COMMUNITY)
Admission: RE | Admit: 2016-02-29 | Discharge: 2016-02-29 | Disposition: A | Discharge status: Home / Self Care | Payer: Medicare Other | Source: Ambulatory Visit | Attending: Nurse Practitioner | Admitting: Nurse Practitioner

## 2016-02-29 ENCOUNTER — Other Ambulatory Visit: Payer: Self-pay | Admitting: Medical Oncology

## 2016-02-29 ENCOUNTER — Ambulatory Visit (HOSPITAL_BASED_OUTPATIENT_CLINIC_OR_DEPARTMENT_OTHER): Payer: Medicare Other

## 2016-02-29 ENCOUNTER — Telehealth: Payer: Self-pay | Admitting: Medical Oncology

## 2016-02-29 ENCOUNTER — Telehealth: Payer: Self-pay | Admitting: Internal Medicine

## 2016-02-29 ENCOUNTER — Ambulatory Visit (HOSPITAL_BASED_OUTPATIENT_CLINIC_OR_DEPARTMENT_OTHER): Payer: Medicare Other | Admitting: Nurse Practitioner

## 2016-02-29 ENCOUNTER — Other Ambulatory Visit: Payer: Self-pay | Admitting: *Deleted

## 2016-02-29 VITALS — BP 106/60 | HR 117 | Temp 98.0°F | Resp 20 | Ht 69.0 in

## 2016-02-29 DIAGNOSIS — J90 Pleural effusion, not elsewhere classified: Secondary | ICD-10-CM | POA: Insufficient documentation

## 2016-02-29 DIAGNOSIS — E860 Dehydration: Secondary | ICD-10-CM

## 2016-02-29 DIAGNOSIS — C3491 Malignant neoplasm of unspecified part of right bronchus or lung: Secondary | ICD-10-CM | POA: Diagnosis not present

## 2016-02-29 DIAGNOSIS — R509 Fever, unspecified: Secondary | ICD-10-CM | POA: Diagnosis not present

## 2016-02-29 DIAGNOSIS — C3411 Malignant neoplasm of upper lobe, right bronchus or lung: Secondary | ICD-10-CM

## 2016-02-29 DIAGNOSIS — R110 Nausea: Secondary | ICD-10-CM

## 2016-02-29 DIAGNOSIS — R918 Other nonspecific abnormal finding of lung field: Secondary | ICD-10-CM | POA: Insufficient documentation

## 2016-02-29 DIAGNOSIS — G893 Neoplasm related pain (acute) (chronic): Secondary | ICD-10-CM

## 2016-02-29 DIAGNOSIS — N390 Urinary tract infection, site not specified: Secondary | ICD-10-CM

## 2016-02-29 DIAGNOSIS — R112 Nausea with vomiting, unspecified: Secondary | ICD-10-CM

## 2016-02-29 DIAGNOSIS — J189 Pneumonia, unspecified organism: Secondary | ICD-10-CM

## 2016-02-29 DIAGNOSIS — T451X5A Adverse effect of antineoplastic and immunosuppressive drugs, initial encounter: Secondary | ICD-10-CM

## 2016-02-29 DIAGNOSIS — G4700 Insomnia, unspecified: Secondary | ICD-10-CM

## 2016-02-29 LAB — COMPREHENSIVE METABOLIC PANEL
ALK PHOS: 107 U/L (ref 40–150)
ALT: 11 U/L (ref 0–55)
AST: 49 U/L — AB (ref 5–34)
Albumin: 2.4 g/dL — ABNORMAL LOW (ref 3.5–5.0)
Anion Gap: 9 mEq/L (ref 3–11)
BUN: 12.5 mg/dL (ref 7.0–26.0)
CALCIUM: 9.5 mg/dL (ref 8.4–10.4)
CHLORIDE: 102 meq/L (ref 98–109)
CO2: 26 mEq/L (ref 22–29)
Creatinine: 0.8 mg/dL (ref 0.6–1.1)
EGFR: 76 mL/min/{1.73_m2} — AB (ref >90)
Glucose: 107 mg/dl (ref 70–140)
POTASSIUM: 4.4 meq/L (ref 3.5–5.1)
SODIUM: 136 meq/L (ref 136–145)
Total Bilirubin: 1.15 mg/dL (ref 0.20–1.20)
Total Protein: 6 g/dL — ABNORMAL LOW (ref 6.4–8.3)

## 2016-02-29 LAB — URINALYSIS, MICROSCOPIC - CHCC
BILIRUBIN (URINE): NEGATIVE
BLOOD: NEGATIVE
GLUCOSE UR CHCC: NEGATIVE mg/dL
Ketones: 5 mg/dL
NITRITE: NEGATIVE
PH: 6.5 (ref 4.6–8.0)
PROTEIN: 30 mg/dL
Specific Gravity, Urine: 1.015 (ref 1.003–1.035)
UROBILINOGEN UR: 0.2 mg/dL (ref 0.2–1)

## 2016-02-29 LAB — CBC WITH DIFFERENTIAL/PLATELET
BASO%: 0.2 % (ref 0.0–2.0)
BASOS ABS: 0 10*3/uL (ref 0.0–0.1)
EOS ABS: 0 10*3/uL (ref 0.0–0.5)
EOS%: 0.3 % (ref 0.0–7.0)
HEMATOCRIT: 35 % (ref 34.8–46.6)
HGB: 11.6 g/dL (ref 11.6–15.9)
LYMPH%: 18.8 % (ref 14.0–49.7)
MCH: 30.8 pg (ref 25.1–34.0)
MCHC: 33 g/dL (ref 31.5–36.0)
MCV: 93.1 fL (ref 79.5–101.0)
MONO#: 0.5 10*3/uL (ref 0.1–0.9)
MONO%: 4.6 % (ref 0.0–14.0)
NEUT#: 7.8 10*3/uL — ABNORMAL HIGH (ref 1.5–6.5)
NEUT%: 76.1 % (ref 38.4–76.8)
Platelets: 249 10*3/uL (ref 145–400)
RBC: 3.76 10*6/uL (ref 3.70–5.45)
RDW: 16.3 % — ABNORMAL HIGH (ref 11.2–14.5)
WBC: 10.3 10*3/uL (ref 3.9–10.3)
lymph#: 1.9 10*3/uL (ref 0.9–3.3)

## 2016-02-29 MED ORDER — HYDROCODONE-ACETAMINOPHEN 5-325 MG PO TABS: ORAL_TABLET | ORAL | Status: DC

## 2016-02-29 MED ORDER — MORPHINE SULFATE ER 15 MG PO TBCR: 15.0000 mg | EXTENDED_RELEASE_TABLET | Freq: Two times a day (BID) | ORAL | Status: DC

## 2016-02-29 MED ORDER — MORPHINE SULFATE 4 MG/ML IJ SOLN
2.0000 mg | Freq: Once | INTRAMUSCULAR | Status: AC | PRN
  Administered 2016-02-29: 2 mg via INTRAVENOUS
  Filled 2016-02-29: qty 1

## 2016-02-29 MED ORDER — SODIUM CHLORIDE 0.9 % IV SOLN
INTRAVENOUS | Status: AC
  Administered 2016-02-29: 16:00:00 via INTRAVENOUS

## 2016-02-29 MED ORDER — SODIUM CHLORIDE 0.9 % IV SOLN
Freq: Once | INTRAVENOUS | Status: AC
  Administered 2016-02-29: 16:00:00 via INTRAVENOUS
  Filled 2016-02-29: qty 4

## 2016-02-29 MED ORDER — LEVOFLOXACIN 500 MG PO TABS: 500.0000 mg | ORAL_TABLET | Freq: Every day | ORAL | Status: DC

## 2016-02-29 MED ORDER — MORPHINE SULFATE (PF) 4 MG/ML IV SOLN
INTRAVENOUS | Status: AC
  Filled 2016-02-29: qty 1

## 2016-02-29 NOTE — Telephone Encounter (Signed)
F/U call -pt not doing well-not  Eating . Or sleeping fever taking tylenol. Per Julien Nordmann offer [facility] visit.Hsuband agreeable to bring pt in -'Not the emergency room , this is not an emergency room situation". Onc Tx request sent-pt prefers appt around 1230 .

## 2016-02-29 NOTE — Telephone Encounter (Signed)
Spoke with patient husband re lab appointment today at 1 pm.

## 2016-02-29 NOTE — Progress Notes (Signed)
Palliative Care Referral faxed to [facility]- confirmation received. Sent copy to scan to chart. Patient has a copy as well.

## 2016-03-01 LAB — URINE CULTURE

## 2016-03-02 ENCOUNTER — Telehealth: Payer: Self-pay | Admitting: *Deleted

## 2016-03-02 ENCOUNTER — Encounter: Payer: Self-pay | Admitting: Nurse Practitioner

## 2016-03-02 DIAGNOSIS — J189 Pneumonia, unspecified organism: Secondary | ICD-10-CM | POA: Insufficient documentation

## 2016-03-02 DIAGNOSIS — R112 Nausea with vomiting, unspecified: Secondary | ICD-10-CM | POA: Insufficient documentation

## 2016-03-02 DIAGNOSIS — G4700 Insomnia, unspecified: Secondary | ICD-10-CM | POA: Insufficient documentation

## 2016-03-02 DIAGNOSIS — G893 Neoplasm related pain (acute) (chronic): Secondary | ICD-10-CM | POA: Insufficient documentation

## 2016-03-02 NOTE — Assessment & Plan Note (Signed)
Patient reports some chronic nausea and intermittent vomiting.  She has been taking the anti--nausea medications with minimal effectiveness.  Patient appears mildly dehydrated today; will receive IV fluid rehydration while at the cancer center.  She was also encouraged.  Push fluids at home.

## 2016-03-02 NOTE — Progress Notes (Signed)
SYMPTOM MANAGEMENT CLINIC   HPI: Deborah [name] 70 y.o. female diagnosed with lung cancer.  Currently undergoing Alimta chemotherapy regimen.  Patient presents to the [facility] today with complaint of nausea, vomiting, and dehydration.  She also reports a fever to maximum 104.0 recently.  She is complaining of mild increase shortness of breath and some new onset left rib pain.  She also complains of some insomnia.   HPI  Review of Systems  Constitutional: Positive for fever, chills, weight loss and malaise/fatigue.  Respiratory: Positive for shortness of breath. Negative for cough, hemoptysis, sputum production and wheezing.   Cardiovascular:       Left rib pain.  Gastrointestinal: Positive for nausea and vomiting.  All other systems reviewed and are negative.   Past Medical History  Diagnosis Date  . Allergy     [place]  . Hyperlipidemia 08/04/2011  . Hypothyroidism 08/04/2011  . Atrophic vaginitis   . Osteopenia 10/2012    T score -1.7 FRAX 7%/0.3%  . Vertigo   . Shortness of breath dyspnea   . Meniere's disease   . Encounter for antineoplastic chemotherapy 10/28/2015  . 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 Basal cell carcinoma of skin 2010  . Non-small cell lung cancer [facility]) September 2016    Adenocarcinoma  . Malignant neoplasm of right upper lobe of lung ([place]) 08/26/2015    Past Surgical History  Procedure Laterality Date  . Tonsillectomy  1952  . Vaginal hysterectomy  1980  . Breast biopsy  1999    RADIATION TREATMENTS  . Breast lumpectomy Right 1998    right  . 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    has No Known Allergies.    Medication List       This list is accurate as of: 02/29/16 11:59 PM.  Always use your most recent med list.               albuterol 108 (90 Base) MCG/ACT inhaler  Commonly known as:  PROVENTIL HFA;VENTOLIN HFA  Inhale 2 puffs into the lungs every 6 (six) hours as needed for wheezing or shortness of breath.     dexamethasone 4 MG tablet  Commonly known as:  DECADRON  4 mg po bid the day before, day of and day after chemo     feeding supplement (ENSURE ENLIVE) Liqd  Take 237 mLs by mouth 2 (two) times daily between meals.     folic acid 1 MG tablet  Commonly known as:  FOLVITE  Take 1 tablet (1 mg total) by mouth daily.     guaiFENesin 600 MG 12 hr tablet  Commonly known as:  MUCINEX  Take 2 tablets (1,200 mg total) by mouth 2 (two) times daily.     HYDROcodone-acetaminophen 5-325 MG tablet  Commonly known as:  NORCO/VICODIN  May take 1/2 tab up to  max of 2 tabs PO Q 6 hours break thru pain.     HYDROcodone-homatropine 5-1.5 MG/5ML syrup  Commonly known as:  HYCODAN  Take 5 mLs by mouth every 6 (six) hours as needed for cough.     ipratropium-albuterol 0.5-2.5 (3) MG/3ML Soln  Commonly known as:  DUONEB  Take 3 mLs by nebulization 3 (three) times daily.     levofloxacin 500 MG tablet  Commonly known as:  LEVAQUIN  Take 1 tablet (500 mg total) by mouth daily.     levothyroxine 75 MCG tablet  Commonly known as:  SYNTHROID, LEVOTHROID  TAKE 1 TABLET BY MOUTH DAILY.     LORazepam 0.5 MG tablet  Commonly known as:   ATIVAN  Take one every eight hours as needed for anxiety.  Prefers one at bedtime.     morphine 15 MG 12 hr tablet  Commonly known as:  MS CONTIN  Take 1 tablet (15 mg total) by mouth every 12 (twelve) hours.     ondansetron 8 MG tablet  Commonly known as:  ZOFRAN  Take 1 tablet (8 mg total) by mouth every 8 (eight) hours as needed for nausea or vomiting.     polyethylene glycol packet  Commonly known as:  MIRALAX / GLYCOLAX  Take 17 g by mouth daily as needed.     prochlorperazine 10 MG tablet  Commonly known as:  COMPAZINE  Take 1 tablet (10 mg total) by mouth every 6 (six) hours as needed for nausea or vomiting.     senna 8.6 MG Tabs tablet  Commonly known as:  SENOKOT  Take 1 tablet (8.6 mg total) by mouth daily.     UNABLE TO FIND  Wheelchair for assistance with ambulation         PHYSICAL EXAMINATION  Oncology Vitals 02/29/2016 02/29/2016  Height - 175 cm  Weight - -  Weight (lbs) - -  BMI (kg/m2) - -  Temp - 98  Pulse 117 132  Resp 20 19  SpO2 95 95  BSA (m2) - -   BP Readings from Last 2 Encounters:  02/29/16 106/60  02/24/16 123/71    Physical Exam  Constitutional: She is oriented to person, place, and time. She appears malnourished and dehydrated. She appears unhealthy. She appears cachectic.  Patient appears fatigued, weak, frail, and chronically ill.  HENT:  Head: Normocephalic and atraumatic.  Mouth/Throat: Oropharynx is clear and moist.  Eyes: Conjunctivae and EOM are normal. Pupils are equal, round, and reactive to light. Right eye exhibits no discharge. Left eye exhibits no discharge. No scleral icterus.  Neck: Normal range of motion. Neck supple. No JVD present. No tracheal deviation present. No thyromegaly present.  Cardiovascular: Regular rhythm, normal heart sounds and intact distal pulses.   Tachycardia.  Pulmonary/Chest: No respiratory distress. She has no wheezes. She has no rales. She exhibits tenderness.  Bilateral breast sounds  essentially clear; with diminished basis.  Patient on O2 at 2 L nasal cannula.  Patient does have some tenderness to the left rib area.  Abdominal: Soft. Bowel sounds are normal. She exhibits no distension and no mass. There is no tenderness. There is no rebound and no guarding.  Musculoskeletal: Normal range of motion. She exhibits no edema or tenderness.  Lymphadenopathy:    She has no cervical adenopathy.  Neurological: She is alert and oriented to person, place, and time.  Skin: Skin is warm and dry. No rash noted. No erythema. There is pallor.  Psychiatric: Affect normal.  LABORATORY DATA:. Appointment on 02/29/2016  Component Date Value Ref Range Status  . WBC 02/29/2016 10.3  3.9 - 10.3 10e3/uL Final  . NEUT# 02/29/2016 7.8* 1.5 - 6.5 10e3/uL Final  . HGB 02/29/2016 11.6  11.6 - 15.9 g/dL Final  . HCT 02/29/2016 35.0  34.8 - 46.6 % Final  . Platelets 02/29/2016 249  145 - 400 10e3/uL Final  . MCV 02/29/2016 93.1  79.5 - 101.0 fL Final  . MCH 02/29/2016 30.8  25.1 - 34.0 pg Final  . MCHC 02/29/2016 33.0  31.5 - 36.0 g/dL Final  . RBC 02/29/2016 3.76  3.70 - 5.45 10e6/uL Final  . RDW 02/29/2016 16.3* 11.2 - 14.5 % Final  . lymph# 02/29/2016 1.9  0.9 - 3.3 10e3/uL Final  . MONO# 02/29/2016 0.5  0.1 - 0.9 10e3/uL Final  . Eosinophils Absolute 02/29/2016 0.0  0.0 - 0.5 10e3/uL Final  . Basophils Absolute 02/29/2016 0.0  0.0 - 0.1 10e3/uL Final  . NEUT% 02/29/2016 76.1  38.4 - 76.8 % Final  . LYMPH% 02/29/2016 18.8  14.0 - 49.7 % Final  . MONO% 02/29/2016 4.6  0.0 - 14.0 % Final  . EOS% 02/29/2016 0.3  0.0 - 7.0 % Final  . BASO% 02/29/2016 0.2  0.0 - 2.0 % Final  . Sodium 02/29/2016 136  136 - 145 mEq/L Final  . Potassium 02/29/2016 4.4  3.5 - 5.1 mEq/L Final  . Chloride 02/29/2016 102  98 - 109 mEq/L Final  . CO2 02/29/2016 26  22 - 29 mEq/L Final  . Glucose 02/29/2016 107  70 - 140 mg/dl Final   Glucose reference range is for nonfasting patients. Fasting glucose reference  range is 70- 100.  Marland Kitchen BUN 02/29/2016 12.5  7.0 - 26.0 mg/dL Final  . Creatinine 02/29/2016 0.8  0.6 - 1.1 mg/dL Final  . Total Bilirubin 02/29/2016 1.15  0.20 - 1.20 mg/dL Final  . Alkaline Phosphatase 02/29/2016 107  40 - 150 U/L Final  . AST 02/29/2016 49* 5 - 34 U/L Final  . ALT 02/29/2016 11  0 - 55 U/L Final  . Total Protein 02/29/2016 6.0* 6.4 - 8.3 g/dL Final  . Albumin 02/29/2016 2.4* 3.5 - 5.0 g/dL Final  . Calcium 02/29/2016 9.5  8.4 - 10.4 mg/dL Final  . Anion Gap 02/29/2016 9  3 - 11 mEq/L Final  . EGFR 02/29/2016 76* >90 ml/min/1.73 m2 Final   eGFR is calculated using the CKD-EPI Creatinine Equation (2009)  . Urine Culture, Routine 02/29/2016 Final report   Final  . Urine Culture result 1 02/29/2016 Comment   Final   Comment: Mixed urogenital flora Less than 10,000 colonies/mL   . Glucose 02/29/2016 Negative  Negative mg/dL Final  . Bilirubin (Urine) 02/29/2016 Negative  Negative Final  . Ketones 02/29/2016 5  Negative mg/dL Final  . Specific Gravity, Urine 02/29/2016 1.015  1.003 - 1.035 Final  . Blood 02/29/2016 Negative  Negative Final  . pH 02/29/2016 6.5  4.6 - 8.0 Final  . Protein 02/29/2016 30  Negative- <30 mg/dL Final  . Urobilinogen, UR 02/29/2016 0.2  0.2 - 1 mg/dL Final  . Nitrite 02/29/2016 Negative  Negative Final  . Leukocyte Esterase 02/29/2016 Trace  Negative Final  . RBC / HPF 02/29/2016 0-2  0 - 2 Final  . WBC, UA 02/29/2016 7-10  0 - 2 Final  . Bacteria, UA 02/29/2016 Trace  Negative- Trace Final  . Epithelial Cells 02/29/2016 Moderate  Negative- Few Final  . Mucus, UA  02/29/2016 Small  Negative- Small Final  . BLOOD CULTURE, ROUTINE 02/29/2016 Preliminary report   Preliminary  . RESULT 1 02/29/2016 Comment   Preliminary   No growth in 36 - 48 hours.  Marland Kitchen BLOOD CULTURE, ROUTINE 02/29/2016 Preliminary report   Preliminary  . RESULT 1 02/29/2016 Comment   Preliminary   No growth in 36 - 48 hours.     RADIOGRAPHIC STUDIES: Dg Chest 2  View  02/29/2016  CLINICAL DATA:  Left-sided chest pain. History of non-small cell lung cancer. EXAM: CHEST  2 VIEW COMPARISON:  February 15, 2016. FINDINGS: Stable cardiomediastinal silhouette. There is continued presence of large airspace opacity seen in right midlung and right basilar region concerning for pneumonia or inflammation. Multiple small pulmonary nodules are again noted. Mild right pleural effusion is noted. Left basilar opacity is also noted consistent with pneumonia or atelectasis with associated pleural effusion. No pneumothorax is noted. Bony thorax is unremarkable. IMPRESSION: Stable bilateral lung opacities are noted concerning for pneumonia or atelectasis. Stable bilateral pulmonary nodules are noted concerning for metastatic disease. Stable bilateral pleural effusions are noted. Electronically Signed   By: Marijo Conception, M.D.   On: 02/29/2016 16:02   Dg Ribs Unilateral Left  02/29/2016  CLINICAL DATA:  Left-sided chest pain, no known injury, initial encounter EXAM: LEFT RIBS - 2 VIEW COMPARISON:  02/15/2016 FINDINGS: Persistent left basilar infiltrate is seen. No acute fracture is noted. No pneumothorax is seen. IMPRESSION: No acute rib abnormality noted. Persistent left basilar infiltrate is seen. Electronically Signed   By: Inez Catalina M.D.   On: 02/29/2016 16:00    ASSESSMENT/PLAN:    UTI (urinary tract infection) Patient states that she has been experiencing a fever from 103-104.0 intermittently for the past few days.  She denies any dysuria, hematuria, frequency/urgency.  Urinalysis obtained today did reveal mild UTI.  Urine culture is pending.  Will prescribe Levaquin which will cover any UTI symptoms.  Pneumonia Patient reports fever to maximum 104.0 friends in the past few days.  She has chronic dyspnea as her baseline; but now reports increased dyspnea and new onset left rib pain for the past few days.  She denies any known injury or trauma to the left rib area.  She  continues to use oxygen on an almost 24/7 basis.  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  Exam obtained today revealed breath sounds essentially clear bilaterally; but some diminished bases.  There was no cough or wheezing noted.  Patient did appear mildly short of breath on exam.  O2 sat was 95% on 2 L nasal cannula.  Of note-patient has some markedly protruding left lower ribs on exam.  This area is nonpainful with palpation.  Both patient and her husband are unaware if this is patient's baseline or not.  Patient underwent a chest CT angiogram on the 3/23 2017.  Chest x-ray obtained today did reveal pneumonia.  Patient will be prescribed Levaquin antibiotics for treatment of pneumonia and any mild UTI symptoms as well.  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  Patient complains of fever and increased shortness of breath; with some new onset left rib pain for the past several days.  Chest x-ray reveals probable pneumonia.  See further notes for details.  Dr. Julien Nordmann in to examine patient is well; and discussed options of either palliative care or hospice.  Both patient and her husband stated  that they were interested in receiving palliative care; but would need to think further regarding the initiation of any hospice care.  A palliative care referral was made today.  Also, patient continues to use oxygen on an almost 24/7 basis.  Patient's husband reports that patient's refillable portable O2 tank's are not feeling properly.  He has had some difficulty arranging the service call from the [facility] team.  This provider spoke at least with Leroy Sea, who is a team leader at [facility].  He assured me that someone would be at the patient's home between 6 and 9 PM tonight to assess and correct the portable oxygen issues.  Patient is scheduled to return on 03/16/2016  for labs, visit, and chemotherapy.  Nausea with vomiting Patient reports some chronic nausea and intermittent vomiting.  She has been taking the anti--nausea medications with minimal effectiveness.  Patient appears mildly dehydrated today; will receive IV fluid rehydration while at the cancer center.  She was also encouraged.  Push fluids at home.  Insomnia Patient states she's had some insomnia for the last few nights.  She does admit to some anxiety as well.  Patient has been taking some Hycodan cough syrup only for pain; and occasionally has to some of her husband's leftover hydrocodone as well.  Will initiate MS Contin 15 mg twice daily for treatment of patient's chronic pain.  Will also give patient hydrocodone to use for breakthrough pain as well.  Hopefully, this will help with patient's insomnia as well.  Dehydration Patient suffers with chronic nausea/vomiting; feels dehydrated today.  Patient will receive IV fluid rehydration while at the cancer Center today.  She was also encouraged to push fluids as well.  Cancer associated pain Patient has been using Hycodan cough syrup for pain control; and is also taken a few of her husband's leftover Vicodin as well.  After reviewing all details with Dr. Brunilda Payor will be prescribed MS Contin 15 mg twice daily with hydrocodone for breakthrough pain.  Patient stated understanding of all instructions; and was in agreement with this plan of care. The patient knows to call the clinic with any problems, questions or concerns.   This was a shared visit with Dr. Julien Nordmann today.  Total time spent with patient was 40 minutes;  with greater than 75 percent of that time spent in face to face counseling regarding patient's symptoms,  and coordination of care and follow up.  Disclaimer:This dictation was prepared with Dragon/digital dictation along with [employer]. Any transcriptional errors that result from this process are  unintentional.  Drue Second, NP 03/02/2016   ADDENDUM: Hematology/Oncology Attending: I had a face to face encounter with the patient. I recommended her care plan. This is a very pleasant 70 years old white female with stage IV non-small cell lung cancer, adenocarcinoma status post induction chemotherapy with carboplatin and Alimta with stable disease after cycle #6. The patient is currently undergoing maintenance chemotherapy with single agent Alimta status post 2 cycles. She called last night complaining of fever up to 104.0 in addition to increasing shortness of breath and new onset left rib pain. The patient also has some insomnia in addition to nausea and vomiting. Her husband was also interested in calling the palliative care team. The patient is feeling a little bit better today. We will order a chest x-ray which revealed left lower lobe pneumonia. We will start the patient empirically on Levaquin. For the nausea and dehydration, will arrange for the patient to receive IV  hydration today. She was also encouraged to increase her by mouth intake. For pain management she will continue with her current pain medication. The patient will come back for follow up visit as previously scheduled.  Disclaimer: This note was dictated with voice recognition software. Similar sounding words can inadvertently be transcribed and may be missed upon review. Eilleen Kempf., MD 03/04/2016

## 2016-03-02 NOTE — Assessment & Plan Note (Signed)
Patient has been using Hycodan cough syrup for pain control; and is also taken a few of her husband's leftover Vicodin as well.  After reviewing all details with Dr. Brunilda Payor will be prescribed MS Contin 15 mg twice daily with hydrocodone for breakthrough pain.

## 2016-03-02 NOTE — Assessment & Plan Note (Signed)
Patient states that she has been experiencing a fever from 103-104.0 intermittently for the past few days.  She denies any dysuria, hematuria, frequency/urgency.  Urinalysis obtained today did reveal mild UTI.  Urine culture is pending.  Will prescribe Levaquin which will cover any UTI symptoms.

## 2016-03-02 NOTE — Assessment & Plan Note (Signed)
Patient received cycle 2 of her Alimta chemotherapy regimen on 02/24/2016.  Patient complains of fever and increased shortness of breath; with some new onset left rib pain for the past several days.  Chest x-ray reveals probable pneumonia.  See further notes for details.  Dr. Julien Nordmann in to examine patient is well; and discussed options of either palliative care or hospice.  Both patient and her husband stated that they were interested in receiving palliative care; but would need to think further regarding the initiation of any hospice care.  A palliative care referral was made today.  Also, patient continues to use oxygen on an almost 24/7 basis.  Patient's husband reports that patient's refillable portable O2 tank's are not feeling properly.  He has had some difficulty arranging the service call from the [facility] team.  This provider spoke at least with Leroy Sea, who is a team leader at [facility].  He assured me that someone would be at the patient's home between 6 and 9 PM tonight to assess and correct the portable oxygen issues.  Patient is scheduled to return on 03/16/2016 for labs, visit, and chemotherapy.

## 2016-03-02 NOTE — Assessment & Plan Note (Signed)
Patient suffers with chronic nausea/vomiting; feels dehydrated today.  Patient will receive IV fluid rehydration while at the cancer Center today.  She was also encouraged to push fluids as well.

## 2016-03-02 NOTE — Assessment & Plan Note (Signed)
Patient reports fever to maximum 104.0 friends in the past few days.  She has chronic dyspnea as her baseline; but now reports increased dyspnea and new onset left rib pain for the past few days.  She denies any known injury or trauma to the left rib area.  She continues to use oxygen on an almost 24/7 basis.  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  Exam obtained today revealed breath sounds essentially clear bilaterally; but some diminished bases.  There was no cough or wheezing noted.  Patient did appear mildly short of breath on exam.  O2 sat was 95% on 2 L nasal cannula.  Of note-patient has some markedly protruding left lower ribs on exam.  This area is nonpainful with palpation.  Both patient and her husband are unaware if this is patient's baseline or not.  Patient underwent a chest CT angiogram on the 3/23 2017.  Chest x-ray obtained today did reveal pneumonia.  Patient will be prescribed Levaquin antibiotics for treatment of pneumonia and any mild UTI symptoms as well.

## 2016-03-02 NOTE — Telephone Encounter (Signed)
TC to pt to check status from [facility] visit 3/14- Pt husband reports pt is doing much better. She has been able to rest peacefully without pain or cough. When she does cough she is productive in clearing mucus. Pt did have portable O2 delivered on 3/14. Pt husband denies any concerns today. He understands to call this office if any needs arise.

## 2016-03-02 NOTE — Assessment & Plan Note (Signed)
Patient states she's had some insomnia for the last few nights.  She does admit to some anxiety as well.  Patient has been taking some Hycodan cough syrup only for pain; and occasionally has to some of her husband's leftover hydrocodone as well.  Will initiate MS Contin 15 mg twice daily for treatment of patient's chronic pain.  Will also give patient hydrocodone to use for breakthrough pain as well.  Hopefully, this will help with patient's insomnia as well.

## 2016-03-03 ENCOUNTER — Other Ambulatory Visit: Payer: Self-pay | Admitting: Internal Medicine

## 2016-03-06 DIAGNOSIS — R531 Weakness: Secondary | ICD-10-CM | POA: Diagnosis not present

## 2016-03-07 ENCOUNTER — Telehealth: Payer: Self-pay | Admitting: Medical Oncology

## 2016-03-07 LAB — CULTURE, BLOOD (SINGLE)

## 2016-03-07 NOTE — Telephone Encounter (Signed)
[name] is going to stat pt on Mirtazepine 15 mg q hs prn.NOte to [place].

## 2016-03-15 ENCOUNTER — Emergency Department (HOSPITAL_COMMUNITY): Payer: Medicare Other

## 2016-03-15 ENCOUNTER — Telehealth: Payer: Self-pay | Admitting: Nurse Practitioner

## 2016-03-15 ENCOUNTER — Encounter (HOSPITAL_COMMUNITY): Payer: Self-pay

## 2016-03-15 ENCOUNTER — Inpatient Hospital Stay (HOSPITAL_COMMUNITY): Payer: Medicare Other

## 2016-03-15 ENCOUNTER — Inpatient Hospital Stay (HOSPITAL_COMMUNITY)
Admission: EM | Admit: 2016-03-15 | Discharge: 2016-03-17 | DRG: 166 | Disposition: A | Discharge status: Hospice | Payer: Medicare Other | Attending: Internal Medicine | Admitting: Internal Medicine

## 2016-03-15 DIAGNOSIS — C384 Malignant neoplasm of pleura: Secondary | ICD-10-CM | POA: Diagnosis not present

## 2016-03-15 DIAGNOSIS — J9621 Acute and chronic respiratory failure with hypoxia: Secondary | ICD-10-CM | POA: Diagnosis present

## 2016-03-15 DIAGNOSIS — C3411 Malignant neoplasm of upper lobe, right bronchus or lung: Secondary | ICD-10-CM | POA: Diagnosis not present

## 2016-03-15 DIAGNOSIS — G893 Neoplasm related pain (acute) (chronic): Secondary | ICD-10-CM | POA: Diagnosis present

## 2016-03-15 DIAGNOSIS — R05 Cough: Secondary | ICD-10-CM | POA: Diagnosis not present

## 2016-03-15 DIAGNOSIS — E440 Moderate protein-calorie malnutrition: Secondary | ICD-10-CM | POA: Insufficient documentation

## 2016-03-15 DIAGNOSIS — Z85828 Personal history of other malignant neoplasm of skin: Secondary | ICD-10-CM | POA: Diagnosis not present

## 2016-03-15 DIAGNOSIS — Z803 Family history of malignant neoplasm of breast: Secondary | ICD-10-CM | POA: Diagnosis not present

## 2016-03-15 DIAGNOSIS — T451X5A Adverse effect of antineoplastic and immunosuppressive drugs, initial encounter: Secondary | ICD-10-CM | POA: Diagnosis not present

## 2016-03-15 DIAGNOSIS — J9600 Acute respiratory failure, unspecified whether with hypoxia or hypercapnia: Secondary | ICD-10-CM | POA: Diagnosis not present

## 2016-03-15 DIAGNOSIS — R451 Restlessness and agitation: Secondary | ICD-10-CM | POA: Diagnosis not present

## 2016-03-15 DIAGNOSIS — E46 Unspecified protein-calorie malnutrition: Secondary | ICD-10-CM | POA: Diagnosis not present

## 2016-03-15 DIAGNOSIS — Z8249 Family history of ischemic heart disease and other diseases of the circulatory system: Secondary | ICD-10-CM | POA: Diagnosis not present

## 2016-03-15 DIAGNOSIS — Z90710 Acquired absence of both cervix and uterus: Secondary | ICD-10-CM | POA: Diagnosis not present

## 2016-03-15 DIAGNOSIS — Z808 Family history of malignant neoplasm of other organs or systems: Secondary | ICD-10-CM | POA: Diagnosis not present

## 2016-03-15 DIAGNOSIS — R590 Localized enlarged lymph nodes: Secondary | ICD-10-CM | POA: Diagnosis not present

## 2016-03-15 DIAGNOSIS — R110 Nausea: Secondary | ICD-10-CM | POA: Diagnosis present

## 2016-03-15 DIAGNOSIS — F419 Anxiety disorder, unspecified: Secondary | ICD-10-CM | POA: Diagnosis present

## 2016-03-15 DIAGNOSIS — Z9981 Dependence on supplemental oxygen: Secondary | ICD-10-CM

## 2016-03-15 DIAGNOSIS — E039 Hypothyroidism, unspecified: Secondary | ICD-10-CM | POA: Diagnosis not present

## 2016-03-15 DIAGNOSIS — J910 Malignant pleural effusion: Secondary | ICD-10-CM | POA: Diagnosis present

## 2016-03-15 DIAGNOSIS — Z7952 Long term (current) use of systemic steroids: Secondary | ICD-10-CM | POA: Diagnosis not present

## 2016-03-15 DIAGNOSIS — R0600 Dyspnea, unspecified: Secondary | ICD-10-CM

## 2016-03-15 DIAGNOSIS — C801 Malignant (primary) neoplasm, unspecified: Secondary | ICD-10-CM | POA: Diagnosis not present

## 2016-03-15 DIAGNOSIS — R0602 Shortness of breath: Secondary | ICD-10-CM | POA: Diagnosis not present

## 2016-03-15 DIAGNOSIS — R0689 Other abnormalities of breathing: Secondary | ICD-10-CM | POA: Diagnosis not present

## 2016-03-15 DIAGNOSIS — J9601 Acute respiratory failure with hypoxia: Secondary | ICD-10-CM | POA: Diagnosis not present

## 2016-03-15 DIAGNOSIS — C3491 Malignant neoplasm of unspecified part of right bronchus or lung: Secondary | ICD-10-CM | POA: Diagnosis not present

## 2016-03-15 DIAGNOSIS — Z833 Family history of diabetes mellitus: Secondary | ICD-10-CM | POA: Diagnosis not present

## 2016-03-15 DIAGNOSIS — C3490 Malignant neoplasm of unspecified part of unspecified bronchus or lung: Secondary | ICD-10-CM | POA: Diagnosis not present

## 2016-03-15 DIAGNOSIS — R531 Weakness: Secondary | ICD-10-CM | POA: Diagnosis not present

## 2016-03-15 DIAGNOSIS — Z7951 Long term (current) use of inhaled steroids: Secondary | ICD-10-CM | POA: Diagnosis not present

## 2016-03-15 DIAGNOSIS — Z66 Do not resuscitate: Secondary | ICD-10-CM

## 2016-03-15 DIAGNOSIS — E785 Hyperlipidemia, unspecified: Secondary | ICD-10-CM | POA: Diagnosis present

## 2016-03-15 DIAGNOSIS — K5900 Constipation, unspecified: Secondary | ICD-10-CM | POA: Diagnosis present

## 2016-03-15 DIAGNOSIS — R069 Unspecified abnormalities of breathing: Secondary | ICD-10-CM | POA: Diagnosis not present

## 2016-03-15 DIAGNOSIS — J90 Pleural effusion, not elsewhere classified: Secondary | ICD-10-CM | POA: Diagnosis not present

## 2016-03-15 DIAGNOSIS — R509 Fever, unspecified: Secondary | ICD-10-CM | POA: Diagnosis not present

## 2016-03-15 DIAGNOSIS — Z515 Encounter for palliative care: Secondary | ICD-10-CM | POA: Insufficient documentation

## 2016-03-15 DIAGNOSIS — D630 Anemia in neoplastic disease: Secondary | ICD-10-CM | POA: Diagnosis present

## 2016-03-15 DIAGNOSIS — E8809 Other disorders of plasma-protein metabolism, not elsewhere classified: Secondary | ICD-10-CM | POA: Diagnosis present

## 2016-03-15 LAB — COMPREHENSIVE METABOLIC PANEL
ALK PHOS: 81 U/L (ref 38–126)
ALT: 10 U/L — ABNORMAL LOW (ref 14–54)
ANION GAP: 9 (ref 5–15)
AST: 53 U/L — ABNORMAL HIGH (ref 15–41)
Albumin: 2.4 g/dL — ABNORMAL LOW (ref 3.5–5.0)
BILIRUBIN TOTAL: 0.5 mg/dL (ref 0.3–1.2)
BUN: 12 mg/dL (ref 6–20)
CALCIUM: 8.6 mg/dL — AB (ref 8.9–10.3)
CO2: 28 mmol/L (ref 22–32)
Chloride: 104 mmol/L (ref 101–111)
Creatinine, Ser: 0.66 mg/dL (ref 0.44–1.00)
GFR calc non Af Amer: >60 mL/min (ref >60)
Glucose, Bld: 140 mg/dL — ABNORMAL HIGH (ref 65–99)
POTASSIUM: 4.1 mmol/L (ref 3.5–5.1)
SODIUM: 141 mmol/L (ref 135–145)
TOTAL PROTEIN: 5.1 g/dL — AB (ref 6.5–8.1)

## 2016-03-15 LAB — CBC WITH DIFFERENTIAL/PLATELET
Basophils Absolute: 0.1 10*3/uL (ref 0.0–0.1)
Basophils Relative: 1 %
EOS ABS: 0.1 10*3/uL (ref 0.0–0.7)
Eosinophils Relative: 1 %
HCT: 31.8 % — ABNORMAL LOW (ref 36.0–46.0)
HEMOGLOBIN: 10.6 g/dL — AB (ref 12.0–15.0)
LYMPHS ABS: 1.1 10*3/uL (ref 0.7–4.0)
Lymphocytes Relative: 13 %
MCH: 31.3 pg (ref 26.0–34.0)
MCHC: 33.3 g/dL (ref 30.0–36.0)
MCV: 93.8 fL (ref 78.0–100.0)
MONO ABS: 1.4 10*3/uL — AB (ref 0.1–1.0)
MONOS PCT: 18 %
NEUTROS PCT: 67 %
Neutro Abs: 5.4 10*3/uL (ref 1.7–7.7)
Platelets: 300 10*3/uL (ref 150–400)
RBC: 3.39 MIL/uL — ABNORMAL LOW (ref 3.87–5.11)
RDW: 16.5 % — AB (ref 11.5–15.5)
WBC: 8 10*3/uL (ref 4.0–10.5)

## 2016-03-15 LAB — BLOOD GAS, VENOUS
Acid-Base Excess: 6 mmol/L — ABNORMAL HIGH (ref 0.0–2.0)
Bicarbonate: 29.4 mEq/L — ABNORMAL HIGH (ref 20.0–24.0)
O2 SAT: 54.7 %
PATIENT TEMPERATURE: 98.6
PH VEN: 7.491 — AB (ref 7.250–7.300)
TCO2: 26.6 mmol/L (ref 0–100)
pCO2, Ven: 38.8 mmHg — ABNORMAL LOW (ref 45.0–50.0)

## 2016-03-15 LAB — PROTIME-INR
INR: 1.14 (ref 0.00–1.49)
Prothrombin Time: 14.7 seconds (ref 11.6–15.2)

## 2016-03-15 LAB — I-STAT TROPONIN, ED: TROPONIN I, POC: 0.01 ng/mL (ref 0.00–0.08)

## 2016-03-15 LAB — MRSA PCR SCREENING: MRSA BY PCR: NEGATIVE

## 2016-03-15 LAB — I-STAT CG4 LACTIC ACID, ED: Lactic Acid, Venous: 1.14 mmol/L (ref 0.5–2.0)

## 2016-03-15 MED ORDER — CHLORHEXIDINE GLUCONATE 0.12 % MT SOLN
15.0000 mL | Freq: Two times a day (BID) | OROMUCOSAL | Status: DC
  Administered 2016-03-15 – 2016-03-17 (×3): 15 mL via OROMUCOSAL
  Filled 2016-03-15 (×2): qty 15

## 2016-03-15 MED ORDER — CETYLPYRIDINIUM CHLORIDE 0.05 % MT LIQD
7.0000 mL | Freq: Two times a day (BID) | OROMUCOSAL | Status: DC
  Administered 2016-03-16 – 2016-03-17 (×3): 7 mL via OROMUCOSAL

## 2016-03-15 MED ORDER — SENNA 8.6 MG PO TABS: 1.0000 | ORAL_TABLET | Freq: Every day | ORAL | Status: DC | PRN

## 2016-03-15 MED ORDER — ACETAMINOPHEN 325 MG PO TABS: 650.0000 mg | ORAL_TABLET | Freq: Four times a day (QID) | ORAL | Status: DC | PRN

## 2016-03-15 MED ORDER — CEFEPIME HCL 1 G IJ SOLR
1.0000 g | Freq: Two times a day (BID) | INTRAMUSCULAR | Status: DC
  Administered 2016-03-15 – 2016-03-17 (×4): 1 g via INTRAVENOUS
  Filled 2016-03-15 (×5): qty 1

## 2016-03-15 MED ORDER — ACETAMINOPHEN 650 MG RE SUPP
650.0000 mg | Freq: Four times a day (QID) | RECTAL | Status: DC | PRN
Start: 2016-03-15 — End: 2016-03-17

## 2016-03-15 MED ORDER — VANCOMYCIN HCL IN DEXTROSE 750-5 MG/150ML-% IV SOLN
750.0000 mg | Freq: Two times a day (BID) | INTRAVENOUS | Status: DC
  Administered 2016-03-16 – 2016-03-17 (×3): 750 mg via INTRAVENOUS
  Filled 2016-03-15 (×4): qty 150

## 2016-03-15 MED ORDER — IOPAMIDOL (ISOVUE-370) INJECTION 76%
100.0000 mL | Freq: Once | INTRAVENOUS | Status: AC | PRN
  Administered 2016-03-15: 100 mL via INTRAVENOUS

## 2016-03-15 MED ORDER — ONDANSETRON HCL 4 MG/2ML IJ SOLN: 4.0000 mg | Freq: Four times a day (QID) | INTRAMUSCULAR | Status: DC | PRN

## 2016-03-15 MED ORDER — IPRATROPIUM-ALBUTEROL 0.5-2.5 (3) MG/3ML IN SOLN
3.0000 mL | Freq: Three times a day (TID) | RESPIRATORY_TRACT | Status: DC
Start: 2016-03-15 — End: 2016-03-17
  Administered 2016-03-15 – 2016-03-17 (×5): 3 mL via RESPIRATORY_TRACT
  Filled 2016-03-15 (×6): qty 3

## 2016-03-15 MED ORDER — VANCOMYCIN HCL 10 G IV SOLR
1250.0000 mg | Freq: Once | INTRAVENOUS | Status: AC
  Administered 2016-03-15: 1250 mg via INTRAVENOUS
  Filled 2016-03-15: qty 1250

## 2016-03-15 MED ORDER — PROCHLORPERAZINE MALEATE 10 MG PO TABS: 10.0000 mg | ORAL_TABLET | Freq: Four times a day (QID) | ORAL | Status: DC | PRN

## 2016-03-15 MED ORDER — SODIUM CHLORIDE 0.9% FLUSH
3.0000 mL | Freq: Two times a day (BID) | INTRAVENOUS | Status: DC
  Administered 2016-03-15 – 2016-03-17 (×2): 3 mL via INTRAVENOUS

## 2016-03-15 MED ORDER — FOLIC ACID 1 MG PO TABS: 1.0000 mg | ORAL_TABLET | Freq: Every day | ORAL | Status: DC

## 2016-03-15 MED ORDER — SODIUM CHLORIDE 0.9% FLUSH: 3.0000 mL | INTRAVENOUS | Status: DC | PRN

## 2016-03-15 MED ORDER — ONDANSETRON HCL 4 MG PO TABS: 4.0000 mg | ORAL_TABLET | Freq: Four times a day (QID) | ORAL | Status: DC | PRN

## 2016-03-15 MED ORDER — HEPARIN SODIUM (PORCINE) 5000 UNIT/ML IJ SOLN
5000.0000 [IU] | Freq: Three times a day (TID) | INTRAMUSCULAR | Status: DC
  Administered 2016-03-15 – 2016-03-17 (×4): 5000 [IU] via SUBCUTANEOUS
  Filled 2016-03-15 (×4): qty 1

## 2016-03-15 MED ORDER — SODIUM CHLORIDE 0.9 % IV BOLUS (SEPSIS)
1000.0000 mL | Freq: Once | INTRAVENOUS | Status: AC
  Administered 2016-03-15: 1000 mL via INTRAVENOUS

## 2016-03-15 MED ORDER — SORBITOL 70 % SOLN
30.0000 mL | Freq: Every day | Status: DC | PRN
  Administered 2016-03-16: 30 mL via ORAL

## 2016-03-15 MED ORDER — MIRTAZAPINE 15 MG PO TABS
15.0000 mg | ORAL_TABLET | Freq: Every evening | ORAL | Status: DC
  Administered 2016-03-15 – 2016-03-16 (×2): 15 mg via ORAL
  Filled 2016-03-15 (×2): qty 1

## 2016-03-15 MED ORDER — POLYETHYLENE GLYCOL 3350 17 G PO PACK
17.0000 g | PACK | Freq: Every day | ORAL | Status: DC
  Administered 2016-03-15: 17 g via ORAL
  Filled 2016-03-15 (×2): qty 1

## 2016-03-15 MED ORDER — SODIUM CHLORIDE 0.9 % IV SOLN: 250.0000 mL | INTRAVENOUS | Status: DC | PRN

## 2016-03-15 MED ORDER — HYDROMORPHONE HCL 1 MG/ML IJ SOLN: 1.0000 mg | INTRAMUSCULAR | Status: DC | PRN

## 2016-03-15 MED ORDER — PIPERACILLIN-TAZOBACTAM 3.375 G IVPB 30 MIN
3.3750 g | Freq: Once | INTRAVENOUS | Status: AC
  Administered 2016-03-15: 3.375 g via INTRAVENOUS
  Filled 2016-03-15: qty 50

## 2016-03-15 MED ORDER — MORPHINE SULFATE ER 15 MG PO TBCR
15.0000 mg | EXTENDED_RELEASE_TABLET | Freq: Two times a day (BID) | ORAL | Status: DC
  Administered 2016-03-15 – 2016-03-17 (×3): 15 mg via ORAL
  Filled 2016-03-15 (×3): qty 1

## 2016-03-15 MED ORDER — LORAZEPAM 0.5 MG PO TABS
0.5000 mg | ORAL_TABLET | Freq: Every evening | ORAL | Status: DC | PRN
  Administered 2016-03-15: 0.5 mg via ORAL
  Filled 2016-03-15: qty 1

## 2016-03-15 MED ORDER — HYDROCODONE-ACETAMINOPHEN 5-325 MG PO TABS: 0.5000 | ORAL_TABLET | Freq: Four times a day (QID) | ORAL | Status: DC | PRN

## 2016-03-15 MED ORDER — LEVOTHYROXINE SODIUM 75 MCG PO TABS
75.0000 ug | ORAL_TABLET | Freq: Every day | ORAL | Status: DC
  Administered 2016-03-16 – 2016-03-17 (×2): 75 ug via ORAL
  Filled 2016-03-15 (×2): qty 1

## 2016-03-15 NOTE — Progress Notes (Signed)
Pharmacy Antibiotic Note  Deborah [name] is a 70 y.o. female admitted on 03/15/2016 with pneumonia.  Pharmacy has been consulted for vancomycin dosing. Patient also prescribed cefepime.  Patient presents with dyspnea, found to enlarging L pleural effusion.  She has history of lung cancer  Plan:  Vancomycin '1250mg'$  IV x 1 in ED then '750mg'$  IV q12h  Follow renal function  Trough if remains on vancomycin   Cefepime ordered 1gm q24h but per est CrCl, will change to 1gm IV q12h  Follow cultures  Height: '5\' 9"'$  (175.3 cm) Weight: 130 lb 1.1 oz (59 kg) IBW/kg (Calculated) : 66.2  Temp (24hrs), Avg:97.8 F (36.6 C), Min:97.4 F (36.3 C), Max:98.2 F (36.8 C)   Recent Labs Lab 03/15/16 1539 03/15/16 1540  WBC 8.0  --   CREATININE 0.66  --   LATICACIDVEN  --  1.14    Estimated Creatinine Clearance: 61.8 mL/min (by C-G formula based on Cr of 0.66).    No Known Allergies  Antimicrobials this admission: 3/29 zosyn x 1 3/29 >> vanco >> 3/29 >> cefepime >>  Dose adjustments this admission:   Microbiology results: 3/14 BCx: NG-F 3/14 UCx: NG 3/29 BCx:  3/29 MRSA PCR:   Thank you for allowing pharmacy to be a part of this patient's care.  Doreene Eland, PharmD, BCPS.   Pager: [phone] 03/15/2016 6:54 PM

## 2016-03-15 NOTE — ED Notes (Signed)
Bed: WA18 Expected date:  Expected time:  Means of arrival:  Comments: Hold for RES 

## 2016-03-15 NOTE — ED Notes (Signed)
Bed: RESA Expected date:  Expected time:  Means of arrival:  Comments: EMS-cancer pt-SOB

## 2016-03-15 NOTE — Progress Notes (Signed)
EDCM spoke to patient at bedside and her husband at bedside.  Husband's phone number [phone].  Patient's husband stated to call him in regards to care of his wife.  Patient lives at home with her husband.  Patient's husband assists patient with her ADL's meals, etc.  Patient currently wear oxygen at home at 3 liters continuous supplied by [facility].  Patient's husband reports the oxygen equipment has not been working properly at home.  Patient's husband reports patient is a Palliative care patient with the [facility].  He provided the name of person following patient with [facility] as Altha Harm.  Patient reports they did not call Vonna Kotyk today.  Patient reports she is currently in transition with her pcp.  Her pcp was Dr. [name] but is in the middle of finding another pcp.  Patient recently admitted and discharged from the hospital from 2/24 to 03/01 with diagnosis of pneumonia of which patient has completed treatment for at home per chart review. Patient is a cancer patient currently receiving treatment.  Patient discharged home previous admission with oxygen, rolling walker and nebulizer.  No further EDCM needs at this time.

## 2016-03-15 NOTE — ED Provider Notes (Signed)
Hope Budds CSN: [id number]     Arrival date & time 03/15/16  1452 History   First MD Initiated Contact with Patient 03/15/16 1459     Chief Complaint  Patient presents with  . Respiratory Distress  . Shortness of Breath     (Consider location/radiation/quality/duration/timing/severity/associated sxs/prior Treatment) Patient is a 70 y.o. female presenting with shortness of breath.  Shortness of Breath Severity:  Severe Onset quality:  Gradual Duration:  2 months Timing:  Constant Progression:  Unchanged Chronicity:  New Relieved by:  Nothing Worsened by:  Nothing tried Ineffective treatments:  None tried Associated symptoms: cough   Associated symptoms: no chest pain, no fever, no headaches, no vomiting and no wheezing    70 yo F With a chief complaint shortness of breath. This been going on for many months now. Patient was recently rediagnosed with lung cancer has had recurrent pleural effusions in the past. He's had a Pleurx catheter was removed sometime ago. Her oncologist Dr. Earlie Server told her that she may need another one with increasing pleural effusion mostly on the left. She has been on 3 L of oxygen since she was the hospital 2 months ago. She feels like over the past couple weeks she's needed more oxygen. On evaluation today she was hypoxic into the 70s on 3 L. Improved to the 90s with nonrebreather. She denies any fevers or chills. Has had a mild cough.  Past Medical History  Diagnosis Date  . Allergy     Mold  . Hyperlipidemia 08/04/2011  . Hypothyroidism 08/04/2011  . Atrophic vaginitis   . Osteopenia 10/2012    T score -1.7 FRAX 7%/0.3%  . Vertigo   . Shortness of breath dyspnea   . Meniere's disease   . Encounter for antineoplastic chemotherapy 10/28/2015  . 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 Basal cell carcinoma of skin 2010  . Non-small cell lung cancer [facility]) September 2016    Adenocarcinoma  . Malignant  neoplasm of right upper lobe of lung ([place]) 08/26/2015   Past Surgical History  Procedure Laterality Date  . Tonsillectomy  1952  . Vaginal hysterectomy  1980  . Breast biopsy  1999    RADIATION TREATMENTS  . Breast lumpectomy Right 1998    right  . Chest tube insertion Right 09/15/2015    Procedure: INSERTION RIGHT PLEURAL DRAINAGE CATHETER;  Surgeon: Melrose Nakayama, MD;  Location: [place];  Service: Thoracic;  Laterality: Right;   Family History  Problem Relation Age of Onset  . Dementia Mother   . Breast cancer Mother     Breast Cancer  . Heart disease Other     Grandparents  . Heart disease Father   . Diabetes Daughter     TYPE 2  . Melanoma Mother    Social History  Substance Use Topics  . Smoking status: Never Smoker   . Smokeless tobacco: Never Used  . Alcohol Use: 1.2 oz/week    2 Glasses of wine per week     Comment: 1 glass wine per wk at times   OB History    Gravida Para Term Preterm AB TAB SAB Ectopic Multiple Living   '4 3  1      3     '$ Review of Systems  Constitutional: Negative for fever and chills.  HENT: Negative for congestion and rhinorrhea.   Eyes: Negative for redness and visual disturbance.  Respiratory: Positive for cough  and shortness of breath. Negative for wheezing.   Cardiovascular: Negative for chest pain and palpitations.  Gastrointestinal: Negative for nausea and vomiting.  Genitourinary: Negative for dysuria and urgency.  Musculoskeletal: Negative for myalgias and arthralgias.  Skin: Negative for pallor and wound.  Neurological: Positive for weakness. Negative for dizziness and headaches.      Allergies  Review of patient's allergies indicates no known allergies.  Home Medications   Prior to Admission medications   Medication Sig Start Date End Date Taking? Authorizing Provider  albuterol (PROVENTIL HFA;VENTOLIN HFA) 108 (90 BASE) MCG/ACT inhaler Inhale 2 puffs into the lungs every 6 (six) hours as needed for wheezing or  shortness of breath. 07/28/15   Biagio Borg, MD  dexamethasone (DECADRON) 4 MG tablet 4 mg po bid the day before, day of and day after chemo 09/10/15   Curt Bears, MD  feeding supplement, ENSURE ENLIVE, (ENSURE ENLIVE) LIQD Take 237 mLs by mouth 2 (two) times daily between meals. 02/16/16   Silver Huguenin Elgergawy, MD  folic acid (FOLVITE) 1 MG tablet TAKE 1 TABLET (1 MG TOTAL) BY MOUTH DAILY. 03/03/16   Curt Bears, MD  guaiFENesin (MUCINEX) 600 MG 12 hr tablet Take 2 tablets (1,200 mg total) by mouth 2 (two) times daily. 02/16/16   Albertine Patricia, MD  HYDROcodone-acetaminophen (NORCO/VICODIN) 5-325 MG tablet May take 1/2 tab up to max of 2 tabs PO Q 6 hours break thru pain. 02/29/16   Susanne Borders, NP  HYDROcodone-homatropine [facility]) 5-1.5 MG/5ML syrup Take 5 mLs by mouth every 6 (six) hours as needed for cough. 09/27/15   Curt Bears, MD  ipratropium-albuterol (DUONEB) 0.5-2.5 (3) MG/3ML SOLN Take 3 mLs by nebulization 3 (three) times daily. 02/16/16   Silver Huguenin Elgergawy, MD  levofloxacin (LEVAQUIN) 500 MG tablet Take 1 tablet (500 mg total) by mouth daily. 02/29/16   Susanne Borders, NP  levothyroxine (SYNTHROID, LEVOTHROID) 75 MCG tablet TAKE 1 TABLET BY MOUTH DAILY. 08/06/14   Biagio Borg, MD  LORazepam (ATIVAN) 0.5 MG tablet Take one every eight hours as needed for anxiety.  Prefers one at bedtime. 02/21/16   Curt Bears, MD  morphine (MS CONTIN) 15 MG 12 hr tablet Take 1 tablet (15 mg total) by mouth every 12 (twelve) hours. 02/29/16   Susanne Borders, NP  ondansetron (ZOFRAN) 8 MG tablet Take 1 tablet (8 mg total) by mouth every 8 (eight) hours as needed for nausea or vomiting. 11/02/15   Curt Bears, MD  polyethylene glycol [facility] / GLYCOLAX) packet Take 17 g by mouth daily as needed. 02/16/16   Albertine Patricia, MD  prochlorperazine (COMPAZINE) 10 MG tablet Take 1 tablet (10 mg total) by mouth every 6 (six) hours as needed for nausea or vomiting. 09/10/15   Curt Bears, MD   senna (SENOKOT) 8.6 MG TABS tablet Take 1 tablet (8.6 mg total) by mouth daily. 02/16/16   Albertine Patricia, MD  UNABLE TO FIND Wheelchair for assistance with ambulation 02/24/16   Curt Bears, MD   BP 142/89 mmHg  Pulse 126  Resp 37  SpO2 96%  LMP 10/11/1979 Physical Exam  Constitutional: She is oriented to person, place, and time. She appears well-developed and well-nourished. No distress.  HENT:  Head: Normocephalic and atraumatic.  Eyes: EOM are normal. Pupils are equal, round, and reactive to light.  Neck: Normal range of motion. Neck supple.  Cardiovascular: Normal rate and regular rhythm.  Exam reveals no gallop and no friction rub.  No murmur heard. Pulmonary/Chest: She is in respiratory distress. She has no wheezes. She has no rales.  Tachypnea. Dullness to percussion up to the scapula on the right, and up to ribs 8 on the left.    Abdominal: Soft. She exhibits no distension. There is no tenderness. There is no rebound and no guarding.  Musculoskeletal: She exhibits no edema or tenderness.  Neurological: She is alert and oriented to person, place, and time.  Skin: Skin is warm and dry. She is not diaphoretic.  Psychiatric: She has a normal mood and affect. Her behavior is normal.  Nursing note and vitals reviewed.   ED Course  Procedures (including critical care time) Labs Review Labs Reviewed  CBC WITH DIFFERENTIAL/PLATELET  COMPREHENSIVE METABOLIC PANEL  BRAIN NATRIURETIC PEPTIDE    Imaging Review No results found. I have personally reviewed and evaluated these images and lab results as part of my medical decision-making.   EKG Interpretation None      MDM   Final diagnoses:  Acute respiratory failure with hypoxia (HCC)  Acute respiratory failure ([place])    70 yo F with a chief complaint shortness of breath. Suspect a very large pleural effusion based on physical exam and history. Chest x-ray labs ordered. Requiring non rebreather mask for O2.    Patient has pneumonia on her chest x-ray. Feeling better with some oxygen administration. Able to titrate the oxygen down. Discussed with hospitalist with a stepdown.  CRITICAL CARE Performed by: Cecilio Asper   Total critical care time: 30 minutes  Critical care time was exclusive of separately billable procedures and treating other patients.  Critical care was necessary to treat or prevent imminent or life-threatening deterioration.  Critical care was time spent personally by me on the following activities: development of treatment plan with patient and/or surrogate as well as nursing, discussions with consultants, evaluation of patient's response to treatment, examination of patient, obtaining history from patient or surrogate, ordering and performing treatments and interventions, ordering and review of laboratory studies, ordering and review of radiographic studies, pulse oximetry and re-evaluation of patient's condition. The patients results and plan were reviewed and discussed.   Any x-rays performed were independently reviewed by myself.   Differential diagnosis were considered with the presenting HPI.  Medications  mirtazapine (REMERON) tablet 15 mg (15 mg Oral Given 12/18/73 1025)  folic acid (FOLVITE) tablet 1 mg (not administered)  HYDROcodone-acetaminophen (NORCO/VICODIN) 5-325 MG per tablet 0.5 tablet (not administered)  morphine (MS CONTIN) 12 hr tablet 15 mg (15 mg Oral Given 03/15/16 2113)  LORazepam (ATIVAN) tablet 0.5 mg (0.5 mg Oral Given 03/15/16 2113)  ipratropium-albuterol (DUONEB) 0.5-2.5 (3) MG/3ML nebulizer solution 3 mL (3 mLs Nebulization Given 03/15/16 2005)  polyethylene glycol (MIRALAX / GLYCOLAX) packet 17 g (17 g Oral Given 03/15/16 1942)  senna (SENOKOT) tablet 8.6 mg (not administered)  prochlorperazine (COMPAZINE) tablet 10 mg (not administered)  levothyroxine (SYNTHROID, LEVOTHROID) tablet 75 mcg (not administered)  heparin injection 5,000 Units  (5,000 Units Subcutaneous Given 03/15/16 2113)  ceFEPIme (MAXIPIME) 1 g in dextrose 5 % 50 mL IVPB (1 g Intravenous Given 03/15/16 2315)  sodium chloride flush (NS) 0.9 % injection 3 mL (3 mLs Intravenous Given 03/15/16 2114)  sodium chloride flush (NS) 0.9 % injection 3 mL (3 mLs Intravenous Given 03/15/16 2114)  sodium chloride flush (NS) 0.9 % injection 3 mL (not administered)  0.9 %  sodium chloride infusion (not administered)  acetaminophen (TYLENOL) tablet 650 mg (not administered)    Or  acetaminophen (TYLENOL) suppository 650 mg (not administered)  HYDROmorphone (DILAUDID) injection 1 mg (not administered)  sorbitol 70 % solution 30 mL (not administered)  ondansetron (ZOFRAN) tablet 4 mg (not administered)    Or  ondansetron (ZOFRAN) injection 4 mg (not administered)  chlorhexidine (PERIDEX) 0.12 % solution 15 mL (15 mLs Mouth Rinse Given 03/15/16 2113)  antiseptic oral rinse (CPC / CETYLPYRIDINIUM CHLORIDE 0.05%) solution 7 mL (not administered)  vancomycin (VANCOCIN) IVPB 750 mg/150 ml premix (not administered)  vancomycin (VANCOCIN) 1,250 mg in sodium chloride 0.9 % 250 mL IVPB (1,250 mg Intravenous New Bag/Given 03/15/16 1800)  piperacillin-tazobactam (ZOSYN) IVPB 3.375 g (0 g Intravenous Stopped 03/15/16 1748)  sodium chloride 0.9 % bolus 1,000 mL (1,000 mLs Intravenous New Bag/Given 03/15/16 1800)  iopamidol (ISOVUE-370) 76 % injection 100 mL (100 mLs Intravenous Contrast Given 03/15/16 2049)    Filed Vitals:   03/15/16 2005 03/15/16 2100 03/15/16 2200 03/15/16 2300  BP:  186/84 126/71 114/82  Pulse: 115     Temp:      TempSrc:      Resp: '25 22 31 20  '$ Height:      Weight:      SpO2: 99% 91% 93% 92%    Final diagnoses:  Acute respiratory failure with hypoxia (HCC)  Acute respiratory failure (HCC)    Admission/ observation were discussed with the admitting physician, patient and/or family and they are comfortable with the plan.     Deno Etienne, DO 03/15/16 2337

## 2016-03-15 NOTE — Progress Notes (Signed)
DIAGNOSIS: Stage IV (T1b, N2, M1a) non-small cell lung cancer, adenocarcinoma with negative EGFR mutation, negative ALK gene translocation, presenting with right upper lobe lung mass in addition to mediastinal lymphadenopathy and malignant pleural effusion as well as innumerable bilateral pulmonary nodules.  PRIOR THERAPY: Systemic chemotherapy with carboplatin for AUC of 5 and Alimta 500 MG/M2 every 3 weeks. First dose on 09/16/2015. Status post 6 cycles. Last dose was given 12/30/2015 with stable disease.  CURRENT THERAPY: Maintenance systemic chemotherapy with single agent Alimta 500 MG/M2 every 3 weeks. First dose 02/03/2016. Status post 2 cycles.  Subjective: The patient is seen and examined today. I also had a lengthy discussion with her husband. She was admitted with acute respiratory distress and low oxygen saturation. Her oxygen tank at home was not providing her with enough oxygen. Her oxygen saturation at room was 87% with supplemental oxygen. The patient has also been complaining of increasing fatigue and weakness. She denied having any fever or chills. She was seen at the emergency department and chest x-ray performed earlier today showed worsening bilateral airspace disease concerning for worsening pneumonia and there was also enlarging left pleural effusion. The patient is feeling a little bit better after receiving treatment in the emergency room.  Objective: Vital signs in last 24 hours: Temp:  [97.4 F (36.3 C)-98.2 F (36.8 C)] 98.2 F (36.8 C) (03/29 1800) Pulse Rate:  [111-126] 125 (03/29 1830) Resp:  [26-37] 35 (03/29 1830) BP: (110-150)/(76-89) 150/88 mmHg (03/29 1830) SpO2:  [92 %-98 %] 92 % (03/29 1830) Weight:  [130 lb 1.1 oz (59 kg)] 130 lb 1.1 oz (59 kg) (03/29 1830)  Intake/Output from previous day:   Intake/Output this shift:    General appearance: alert, cooperative, fatigued and mild distress Resp: diminished breath sounds LLL, dullness to percussion LLL and  rales bilaterally Cardio: regular rate and rhythm, S1, S2 normal, no murmur, click, rub or gallop GI: soft, non-tender; bowel sounds normal; no masses,  no organomegaly Extremities: extremities normal, atraumatic, no cyanosis or edema  Lab Results:   Recent Labs  03/15/16 1539  WBC 8.0  HGB 10.6*  HCT 31.8*  PLT 300   BMET  Recent Labs  03/15/16 1539  NA 141  K 4.1  CL 104  CO2 28  GLUCOSE 140*  BUN 12  CREATININE 0.66  CALCIUM 8.6*    Studies/Results: Dg Chest Port 1 View  03/15/2016  CLINICAL DATA:  Shortness of breath EXAM: PORTABLE CHEST 1 VIEW COMPARISON:  02/29/2016 FINDINGS: Worsening airspace opacities throughout much of the right lung and left base concerning for worsening pneumonia. Enlarging left pleural effusion, now moderate. Small right pleural effusion. Heart is normal size. IMPRESSION: Worsening bilateral airspace disease, or concerning for worsening pneumonia. Enlarging left effusion, now moderate. Electronically Signed   By: Rolm Baptise M.D.   On: 03/15/2016 15:35    Medications: I have reviewed the patient's current medications.  Assessment/Plan: 1) stage IV non-small cell lung cancer, adenocarcinoma status post induction systemic chemotherapy with carboplatin and Alimta with the initial partial response followed by disease stabilization and she is currently on maintenance chemotherapy with single agent Alimta status post 2 cycles. She has been tolerating her treatment well except for increasing fatigue and weakness. I will repeat CT scan of the chest for evaluation of her disease. 2) acute respiratory distress: Multifactorial questionable for disease progression versus bilateral pneumonia versus enlarging left pleural effusion. Other etiologies like pulmonary embolism cannot be ruled out at this point. I will arrange for  the patient to have CT angiogram of the chest for evaluation of her condition and to evaluate for any disease progression, pulmonary  embolism or pneumonia. I agree with starting the patient on empiric antibiotics with cefepime and vancomycin.  3) enlarging left pleural effusion: We will consider the patient for ultrasound-guided left thoracentesis. 4) prognosis: poor at this point but I will discuss with the patient and her husband the option for further treatment or palliative care and hospice after the restaging scan of the chest. Thank you for taking good care of Mrs. Profitt, I will continue to follow up the patient with you and assist in her management an as-needed basis.  LOS: 0 days    Enrrique Mierzwa K. 03/15/2016

## 2016-03-15 NOTE — ED Notes (Signed)
Per EMS, from home, hx of stage 4 lung CA and pleural effusions. SOB with exertion getting worse over the last few days, 3L O2 at home all the time, was on 6L on EMS arrival with O2 sats at 88-90%, was placed on non-rebreather. Improved to 97% on non-rebreather. HR in the 120s. Denies any pain. EMS reports crackles to right side.

## 2016-03-15 NOTE — Telephone Encounter (Signed)
Received a call from [employer], NP-2 was in the home of patient.  He states that patient has been using continuous home oxygen; but appears increasingly short of breath.  O2 sat was 87% even when patient increased her O2.  Patient appears very weak as well.  Home nurse practitioner discussed option of hospice with the patient versus transportation via EMS to the emergency department for further evaluation and management.  Patient and her family request to be transported to the emergency department for further care.  Reviewed all with Dr. Julien Nordmann; he was in agreement that patient should be seen in the emergency department this afternoon for further evaluation and management.

## 2016-03-15 NOTE — H&P (Signed)
[facility] History and Physical  Deborah [name] HGD:[id number] DOB: 1946-11-18 DOA: 03/15/2016  Referring physician: Dr. Tyrone Nine PCP: [name], MD   Chief Complaint: Dyspnea  HPI: Deborah [name] is a 70 y.o. 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 She denies any sick contacts. She denies any chest pain nausea vomiting or diarrhea. She denies any pain. She denies productive cough no change in color.  In the ED: She was found to be tachypnea in tachycardic with a saturation of 96% on 6 L, an ABG was done that showed a pH of 7.49/38/29, mild elevation in her LFTs her lactic acid was 1.1 and her white count was 8 with a hemoglobin of 10.6  Review of Systems:  Constitutional:  No weight loss, night sweats, Fevers, chills, fatigue.  HEENT:  No headaches, Difficulty swallowing,Tooth/dental problems,Sore throat,  No sneezing, itching, ear ache, nasal congestion, post nasal drip,  Cardio-vascular:  No chest pain, Orthopnea, PND, swelling in lower extremities, anasarca, dizziness, palpitations  GI:  No heartburn, indigestion, abdominal pain, nausea, vomiting, diarrhea, change in bowel habits, loss of appetite  Resp:   No coughing up of blood.No change in color of mucus.No wheezing.No chest wall deformity  Skin:  no rash or lesions.  GU:  no dysuria, change in color of urine, no  urgency or frequency. No flank pain.  Musculoskeletal:  No joint pain or swelling. No decreased range of motion. No back pain.  Psych:  No change in mood or affect. No depression or anxiety. No memory loss.   Past Medical History  Diagnosis Date  . Allergy     Mold  . Hyperlipidemia 08/04/2011  . Hypothyroidism 08/04/2011  . Atrophic vaginitis   . Osteopenia 10/2012    T score -1.7 FRAX 7%/0.3%  . Vertigo   . Shortness of breath dyspnea   . Meniere's disease   . Encounter for antineoplastic chemotherapy 10/28/2015  . 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 Basal cell carcinoma of skin 2010  . Non-small cell lung cancer [facility]) September 2016    Adenocarcinoma  . Malignant neoplasm of right upper lobe of lung ([place]) 08/26/2015   Past Surgical History  Procedure Laterality Date  . Tonsillectomy  1952  . Vaginal hysterectomy  1980  . Breast biopsy  1999    RADIATION TREATMENTS  . Breast lumpectomy Right 1998    right  . Chest tube insertion Right 09/15/2015    Procedure: INSERTION RIGHT PLEURAL DRAINAGE CATHETER;  Surgeon: Melrose Nakayama, MD;  Location: [place];  Service: Thoracic;  Laterality: Right;   Social History:  reports that she has never smoked. She has never used smokeless tobacco. She reports that she drinks about 1.2 oz of alcohol per week. She reports that she does not use illicit drugs.  No Known Allergies  Family History  Problem Relation Age of Onset  .  Dementia Mother   . Breast cancer Mother     Breast Cancer  . Heart disease Other     Grandparents  . Heart disease Father   . Diabetes Daughter     TYPE 2  . Melanoma Mother     Prior to Admission medications   Medication Sig Start Date End Date Taking? Authorizing Provider  albuterol (PROVENTIL HFA;VENTOLIN HFA) 108 (90 BASE) MCG/ACT inhaler Inhale 2 puffs into the lungs every 6 (six) hours as needed for wheezing or shortness of breath. 07/28/15  Yes  Biagio Borg, MD  feeding supplement, ENSURE ENLIVE, (ENSURE ENLIVE) LIQD Take 237 mLs by mouth 2 (two) times daily between meals. 02/16/16  Yes Albertine Patricia, MD  folic acid (FOLVITE) 1 MG tablet TAKE 1 TABLET (1 MG TOTAL) BY MOUTH DAILY. 03/03/16  Yes Curt Bears, MD  guaiFENesin (MUCINEX) 600 MG 12 hr tablet Take 2 tablets (1,200 mg total) by mouth 2 (two) times daily. 02/16/16  Yes Albertine Patricia, MD  HYDROcodone-acetaminophen (NORCO/VICODIN) 5-325 MG tablet May take 1/2 tab up to max of 2 tabs PO Q 6 hours break thru pain. Patient taking differently: Take 0.5 tablets by mouth every 6 (six) hours as needed for moderate pain.  02/29/16  Yes Susanne Borders, NP  ipratropium-albuterol (DUONEB) 0.5-2.5 (3) MG/3ML SOLN Take 3 mLs by nebulization 3 (three) times daily. 02/16/16  Yes Dawood S Elgergawy, MD  levothyroxine (SYNTHROID, LEVOTHROID) 75 MCG tablet TAKE 1 TABLET BY MOUTH DAILY. 08/06/14  Yes Biagio Borg, MD  LORazepam (ATIVAN) 0.5 MG tablet Take one every eight hours as needed for anxiety.  Prefers one at bedtime. Patient taking differently: Take 0.5 mg by mouth at bedtime as needed for anxiety. Take one every eight hours as needed for anxiety.  Prefers one at bedtime. 02/21/16  Yes Curt Bears, MD  mirtazapine (REMERON) 15 MG tablet Take 15 mg by mouth every evening. 03/07/16  Yes Historical Provider, MD  morphine (MS CONTIN) 15 MG 12 hr tablet Take 1 tablet (15 mg total) by mouth every 12 (twelve) hours. 02/29/16  Yes Susanne Borders, NP  ondansetron (ZOFRAN) 8 MG tablet Take 1 tablet (8 mg total) by mouth every 8 (eight) hours as needed for nausea or vomiting. 11/02/15  Yes Curt Bears, MD  polyethylene glycol [facility] / GLYCOLAX) packet Take 17 g by mouth daily as needed. Patient taking differently: Take 17 g by mouth daily as needed for mild constipation.  02/16/16  Yes Albertine Patricia, MD  prochlorperazine (COMPAZINE) 10 MG tablet Take 1 tablet (10 mg total) by mouth every 6  (six) hours as needed for nausea or vomiting. 09/10/15  Yes Curt Bears, MD  UNABLE TO FIND Wheelchair for assistance with ambulation 02/24/16  Yes Curt Bears, MD  dexamethasone (DECADRON) 4 MG tablet 4 mg po bid the day before, day of and day after chemo Patient taking differently: Take 4 mg by mouth 2 (two) times daily. '4mg'$  twice daily  day before, day of and day after chemo 09/10/15   Curt Bears, MD  HYDROcodone-homatropine [facility]) 5-1.5 MG/5ML syrup Take 5 mLs by mouth every 6 (six) hours as needed for cough. 09/27/15   Curt Bears, MD  levofloxacin (LEVAQUIN) 500 MG tablet Take 1 tablet (500 mg total) by mouth daily. Patient not taking: Reported on 03/15/2016 02/29/16   Susanne Borders, NP  senna (SENOKOT) 8.6 MG TABS tablet Take 1 tablet (8.6 mg total) by mouth daily. 02/16/16  Albertine Patricia, MD   Physical Exam: Filed Vitals:   03/15/16 1509 03/15/16 1640  BP: 142/89   Pulse: 126   Temp:  97.4 F (36.3 C)  TempSrc:  Axillary  Resp: 37   SpO2: 96%     Wt Readings from Last 3 Encounters:  02/24/16 58.832 kg (129 lb 11.2 oz)  02/11/16 60 kg (132 lb 4.4 oz)  02/11/16 59.24 kg (130 lb 9.6 oz)    General:  Appears calm and comfortable Eyes: PERRL, normal lids, irises & conjunctiva ENT: grossly normal hearing, lips & tongue Neck: no LAD, masses or thyromegaly Cardiovascular: RRR, no m/r/g. No LE edema. Telemetry: SR, no arrhythmias  Respiratory:  good with decreased sounds at the left and crackles on the right upper lung  Abdomen: soft, ntnd Skin: no rash or induration seen on limited exam Musculoskeletal: grossly normal tone BUE/BLE Psychiatric: grossly normal mood and affect, speech fluent and appropriate Neurologic: grossly non-focal.          Labs on Admission:  Basic Metabolic Panel:  Recent Labs Lab 03/15/16 1539  NA 141  K 4.1  CL 104  CO2 28  GLUCOSE 140*  BUN 12  CREATININE 0.66  CALCIUM 8.6*   Liver Function Tests:  Recent  Labs Lab 03/15/16 1539  AST 53*  ALT 10*  ALKPHOS 81  BILITOT 0.5  PROT 5.1*  ALBUMIN 2.4*   No results for input(s): LIPASE, AMYLASE in the last 168 hours. No results for input(s): AMMONIA in the last 168 hours. CBC:  Recent Labs Lab 03/15/16 1539  WBC 8.0  NEUTROABS 5.4  HGB 10.6*  HCT 31.8*  MCV 93.8  PLT 300   Cardiac Enzymes: No results for input(s): CKTOTAL, CKMB, CKMBINDEX, TROPONINI in the last 168 hours.  BNP (last 3 results)  Recent Labs  02/11/16 1638  BNP 15.3    ProBNP (last 3 results) No results for input(s): PROBNP in the last 8760 hours.  CBG: No results for input(s): GLUCAP in the last 168 hours.  Radiological Exams on Admission: Dg Chest Port 1 View  03/15/2016  CLINICAL DATA:  Shortness of breath EXAM: PORTABLE CHEST 1 VIEW COMPARISON:  02/29/2016 FINDINGS: Worsening airspace opacities throughout much of the right lung and left base concerning for worsening pneumonia. Enlarging left pleural effusion, now moderate. Small right pleural effusion. Heart is normal size. IMPRESSION: Worsening bilateral airspace disease, or concerning for worsening pneumonia. Enlarging left effusion, now moderate. Electronically Signed   By: Rolm Baptise M.D.   On: 03/15/2016 15:35    EKG: Independently reviewed. none  Assessment/Plan Acute respiratory failure with hypoxia (HCC) due to  Pleural effusion, malignant I think her acute respiratory failure is multifactorial due to her enlarging left pleural effusion and her oxygen machine not working at home. Her lactic acid was less than 2, she has remained afebrile here with no leukocytosis. Although she relates she had a fever of 101 at home. I agree with starting her on empiric antibiotics IV vancomycin and cefepime for possible healthcare associated pneumonia. Admitted to the stepdown, get IR to perform an ultrasound-guided thoracocentesis and send fluids for cytology cell count and lites criteria. Check a PT and INR,  blood cultures have been ordered we'll continue to follow them. Check CT of the chest to evaluate the progression of her cancer.  Non-small cell carcinoma of right lung - adenocarcinoma:  will let oncologist and she is scheduled for chemotherapy tomorrow.  Hypoalbuminemia due to protein-calorie malnutrition [facility]): Ensure 3  times a day.  Cancer associated pain: Continue narcotics for pain. Continue MiraLAX and add sorbitol when necessary for constipation.  Cancer associated anemia: Her hemoglobin seems to be a baseline.  Code Status: full DVT Prophylaxis:Heparin Family Communication: husband Disposition Plan: inpatient  Time spent: 12 min  FELIZ Marguarite Arbour [facility] Pager: [phone]

## 2016-03-16 ENCOUNTER — Ambulatory Visit: Payer: Medicare Other | Admitting: Nurse Practitioner

## 2016-03-16 ENCOUNTER — Ambulatory Visit: Payer: Medicare Other

## 2016-03-16 ENCOUNTER — Inpatient Hospital Stay (HOSPITAL_COMMUNITY): Payer: Medicare Other

## 2016-03-16 ENCOUNTER — Other Ambulatory Visit: Payer: Medicare Other

## 2016-03-16 DIAGNOSIS — J9601 Acute respiratory failure with hypoxia: Secondary | ICD-10-CM

## 2016-03-16 DIAGNOSIS — G893 Neoplasm related pain (acute) (chronic): Secondary | ICD-10-CM

## 2016-03-16 DIAGNOSIS — Z66 Do not resuscitate: Secondary | ICD-10-CM

## 2016-03-16 DIAGNOSIS — R451 Restlessness and agitation: Secondary | ICD-10-CM

## 2016-03-16 DIAGNOSIS — C3491 Malignant neoplasm of unspecified part of right bronchus or lung: Principal | ICD-10-CM

## 2016-03-16 DIAGNOSIS — R0600 Dyspnea, unspecified: Secondary | ICD-10-CM

## 2016-03-16 DIAGNOSIS — E440 Moderate protein-calorie malnutrition: Secondary | ICD-10-CM | POA: Insufficient documentation

## 2016-03-16 DIAGNOSIS — Z515 Encounter for palliative care: Secondary | ICD-10-CM | POA: Insufficient documentation

## 2016-03-16 LAB — BODY FLUID CELL COUNT WITH DIFFERENTIAL
Eos, Fluid: 0 %
LYMPHS FL: 47 %
Monocyte-Macrophage-Serous Fluid: 41 % — ABNORMAL LOW (ref 50–90)
NEUTROPHIL FLUID: 12 % (ref 0–25)
WBC FLUID: 267 uL (ref 0–1000)

## 2016-03-16 LAB — GRAM STAIN

## 2016-03-16 LAB — BLOOD GAS, ARTERIAL
Acid-Base Excess: 3.1 mmol/L — ABNORMAL HIGH (ref 0.0–2.0)
BICARBONATE: 26.1 meq/L — AB (ref 20.0–24.0)
DRAWN BY: 331471
Delivery systems: POSITIVE
EXPIRATORY PAP: 5
FIO2: 0.5
Inspiratory PAP: 15
O2 SAT: 93.8 %
PATIENT TEMPERATURE: 98.6
TCO2: 24 mmol/L (ref 0–100)
pCO2 arterial: 35.3 mmHg (ref 35.0–45.0)
pH, Arterial: 7.482 — ABNORMAL HIGH (ref 7.350–7.450)
pO2, Arterial: 69.6 mmHg — ABNORMAL LOW (ref 80.0–100.0)

## 2016-03-16 LAB — CBC
HCT: 30.1 % — ABNORMAL LOW (ref 36.0–46.0)
HEMOGLOBIN: 9.8 g/dL — AB (ref 12.0–15.0)
MCH: 31 pg (ref 26.0–34.0)
MCHC: 32.6 g/dL (ref 30.0–36.0)
MCV: 95.3 fL (ref 78.0–100.0)
Platelets: 288 10*3/uL (ref 150–400)
RBC: 3.16 MIL/uL — AB (ref 3.87–5.11)
RDW: 16.5 % — ABNORMAL HIGH (ref 11.5–15.5)
WBC: 10.1 10*3/uL (ref 4.0–10.5)

## 2016-03-16 LAB — BASIC METABOLIC PANEL
Anion gap: 8 (ref 5–15)
BUN: 10 mg/dL (ref 6–20)
CHLORIDE: 105 mmol/L (ref 101–111)
CO2: 28 mmol/L (ref 22–32)
CREATININE: 0.7 mg/dL (ref 0.44–1.00)
Calcium: 8.5 mg/dL — ABNORMAL LOW (ref 8.9–10.3)
GFR calc non Af Amer: >60 mL/min (ref >60)
Glucose, Bld: 118 mg/dL — ABNORMAL HIGH (ref 65–99)
POTASSIUM: 4.3 mmol/L (ref 3.5–5.1)
SODIUM: 141 mmol/L (ref 135–145)

## 2016-03-16 LAB — ECHOCARDIOGRAM COMPLETE
Height: 69 in
Weight: 2081.14 oz

## 2016-03-16 LAB — LACTATE DEHYDROGENASE, PLEURAL OR PERITONEAL FLUID: LD, Fluid: 298 U/L — ABNORMAL HIGH (ref 3–23)

## 2016-03-16 MED ORDER — MIDAZOLAM HCL 2 MG/2ML IJ SOLN
INTRAMUSCULAR | Status: AC | PRN
  Administered 2016-03-16: 1 mg via INTRAVENOUS
  Administered 2016-03-16: 0.5 mg via INTRAVENOUS
  Administered 2016-03-16: 1 mg via INTRAVENOUS

## 2016-03-16 MED ORDER — FENTANYL CITRATE (PF) 100 MCG/2ML IJ SOLN
INTRAMUSCULAR | Status: AC | PRN
  Administered 2016-03-16 (×2): 25 ug via INTRAVENOUS

## 2016-03-16 MED ORDER — MIDAZOLAM HCL 2 MG/2ML IJ SOLN
INTRAMUSCULAR | Status: AC
  Filled 2016-03-16: qty 6

## 2016-03-16 MED ORDER — MORPHINE SULFATE (PF) 2 MG/ML IV SOLN
2.0000 mg | INTRAVENOUS | Status: DC | PRN
  Administered 2016-03-16 (×2): 2 mg via INTRAVENOUS
  Filled 2016-03-16 (×2): qty 1

## 2016-03-16 MED ORDER — MORPHINE SULFATE (PF) 2 MG/ML IV SOLN
2.0000 mg | INTRAVENOUS | Status: DC | PRN
  Administered 2016-03-16 – 2016-03-17 (×5): 2 mg via INTRAVENOUS
  Filled 2016-03-16 (×5): qty 1

## 2016-03-16 MED ORDER — LORAZEPAM 2 MG/ML IJ SOLN: 1.0000 mg | INTRAMUSCULAR | Status: DC | PRN

## 2016-03-16 MED ORDER — ENSURE ENLIVE PO LIQD
237.0000 mL | Freq: Two times a day (BID) | ORAL | Status: DC
  Administered 2016-03-16 – 2016-03-17 (×2): 237 mL via ORAL

## 2016-03-16 MED ORDER — HYDROMORPHONE HCL 2 MG/ML IJ SOLN
INTRAMUSCULAR | Status: AC
  Filled 2016-03-16: qty 1

## 2016-03-16 MED ORDER — LIDOCAINE HCL 1 % IJ SOLN
INTRAMUSCULAR | Status: AC
  Filled 2016-03-16: qty 20

## 2016-03-16 MED ORDER — FENTANYL CITRATE (PF) 100 MCG/2ML IJ SOLN
INTRAMUSCULAR | Status: AC
Start: 2016-03-16 — End: 2016-03-16
  Filled 2016-03-16: qty 4

## 2016-03-16 MED ORDER — LIDOCAINE HCL 1 % IJ SOLN
INTRAMUSCULAR | Status: AC | PRN
  Administered 2016-03-16: 15 mL via INTRADERMAL

## 2016-03-16 NOTE — Progress Notes (Addendum)
[facility] PROGRESS NOTE    Progress Note   Deborah [name] INO:[id number] DOB: 1946/12/10 DOA: 03/15/2016 PCP: [name], MD   Brief Narrative:   Deborah [name] is an 70 y.o. 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  Assessment/Plan:   Acute respiratory failure with hypoxia (HCC) with Pleural effusion, malignant and progression of  Non-small cell carcinoma of right lung - adenocarcinoma Started empirically on Vanc and cefepime, has remained afebrile. CT chest showed worsening lymphangitic carcinomatosis bilaterally. Multiple enlarge lymph node and lytic lesion on spine. Appreciate oncology assistance. Place NPO, with more difficulty breathing than yesterday. Start morphine for dyspnea, place on a non-rebreathe. Consult PCCM and PMT, check ABG and echo stat, CT showed pleural thickening, no pulses paroduxus. She is still a full code I have d/w her implication and outcomes. She relates she would like to speak ot her husband.  Hypoalbuminemia due to protein-calorie malnutrition (HCC)/ Cancer associated pain: Place her NPO. Continue narcotics for pain. Continue MiraLAX and add sorbitol when necessary for constipation.    DVT Prophylaxis - Lovenox ordered.  Family Communication: husband Disposition Plan: inpatient Code Status:     Code Status Orders        Start     Ordered   03/15/16 1834  Full code   Continuous     03/15/16 1833    Code Status History    Date Active Date Inactive Code Status Order ID Comments User Context   02/11/2016  8:57 PM 02/16/2016  6:49 PM Full Code [id number]  Roney Jaffe, MD Inpatient    Advance Directive Documentation        Most Recent Value   Type of Advance Directive  Living will   Pre-existing out of facility DNR order (yellow form or pink MOST form)     "MOST" Form in Place?          IV Access:    Peripheral IV   Procedures and diagnostic studies:   Ct Angio Chest Pe W/cm &/or Wo Cm  03/15/2016  CLINICAL DATA:  Stage IV right lung adenocarcinoma on chemotherapy, recently treated for healthcare associated pneumonia, with worsening dyspnea, fever and productive cough. EXAM: CT ANGIOGRAPHY CHEST WITH CONTRAST TECHNIQUE: Multidetector CT imaging of the chest was performed using the standard protocol during bolus administration of intravenous contrast. Multiplanar CT image reconstructions and MIPs were obtained to evaluate the vascular anatomy. CONTRAST:  100 cc Isovue 370 IV. COMPARISON:  Chest radiograph from earlier today. 02/10/2016 chest CT. FINDINGS: Mediastinum/Nodes: The study is high quality for the evaluation of pulmonary embolism. There are no filling defects in the central, lobar, segmental or subsegmental pulmonary artery branches to suggest acute pulmonary embolism. Great vessels are normal in course and caliber. Normal heart size. Stable mild pericardial fluid/ thickening. Normal visualized thyroid. Normal esophagus. Stable mildly enlarged bilateral supraclavicular nodes, largest 1.0 cm bilaterally. No axillary adenopathy. Stable mildly enlarged 1.1 cm subcarinal node (series 4/ image 46). Bilateral confluent hilar adenopathy appears stable on the left and increased on the right. Lungs/Pleura: No pneumothorax. Stable small right pleural effusion with diffuse right pleural thickening and enhancement. Moderate left pleural effusion is increased, with scattered areas of left pleural thickening and enhancement. There is masslike consolidation with air bronchograms involving most of the right lung, which has  significantly increased since 02/10/2016, for example measuring 10.7 x 8.8 cm in the right upper lobe (series 7/image 39), compared to 5.2 x 3.5 cm on 02/10/2016 at  a comparable level, markedly increased, favor progression of tumor. There is consolidation and volume loss in the basilar left lower lobe, probably a combination of atelectasis and tumor. There is severe interlobular septal thickening and peribronchovascular, subpleural nodularity throughout both lungs, which appears worsened, in keeping with lymphangitic carcinomatosis. Upper abdomen: Limited visualized portions of the upper abdomen appear unremarkable. Musculoskeletal: There is a diffuse moth-eaten appearance of the entire thoracic skeleton, likely representing infiltrative lytic osseous metastases, with areas of cortical erosion as best seen at the posterior manubrium (series 603/ image 69), which appears slightly worsened since 02/10/2016. Moderate degenerative changes in the thoracic spine. Review of the MIP images confirms the above findings. IMPRESSION: 1. No pulmonary embolism. 2. Severe lymphangitic carcinomatosis throughout both lungs, worsened. Masslike consolidation involving most of the right lung, markedly increased since 02/10/2016, which likely predominantly represents significant tumor progression. A component of postobstructive pneumonia cannot be excluded. 3. Malignant bilateral pleural effusions, stable and small on the right and moderate and increased on the left. 4. Diffuse moth-eaten appearance of the thoracic skeleton, favor infiltrative lytic osseous metastases, slightly worsened. 5. Stable mild pericardial fluid/thickening. 6. Bilateral supraclavicular, subcarinal and bilateral hilar lymphadenopathy, increased at the right hilum. Electronically Signed   By: Ilona Sorrel M.D.   On: 03/15/2016 21:21   Dg Chest Port 1 View  03/15/2016  CLINICAL DATA:  Shortness of breath EXAM: PORTABLE CHEST 1 VIEW COMPARISON:  02/29/2016 FINDINGS: Worsening airspace opacities throughout much of the right lung and left base concerning for worsening pneumonia. Enlarging left pleural effusion, now moderate.  Small right pleural effusion. Heart is normal size. IMPRESSION: Worsening bilateral airspace disease, or concerning for worsening pneumonia. Enlarging left effusion, now moderate. Electronically Signed   By: Rolm Baptise M.D.   On: 03/15/2016 15:35     Medical Consultants:    None.  Anti-Infectives:   Anti-infectives    Start     Dose/Rate Route Frequency Ordered Stop   03/16/16 0600  vancomycin (VANCOCIN) IVPB 750 mg/150 ml premix     750 mg 150 mL/hr over 60 Minutes Intravenous Every 12 hours 03/15/16 1851     03/16/16 0000  ceFEPIme (MAXIPIME) 1 g in dextrose 5 % 50 mL IVPB     1 g 100 mL/hr over 30 Minutes Intravenous Every 12 hours 03/15/16 1833     03/15/16 1615  vancomycin (VANCOCIN) 1,250 mg in sodium chloride 0.9 % 250 mL IVPB     1,250 mg 166.7 mL/hr over 90 Minutes Intravenous  Once 03/15/16 1548 03/15/16 1930   03/15/16 1600  piperacillin-tazobactam (ZOSYN) IVPB 3.375 g     3.375 g 100 mL/hr over 30 Minutes Intravenous  Once 03/15/16 1548 03/15/16 1748      Subjective:    Deborah [name] she relates her breathing is worst. She had a good night, but woke up this am with worsning dyspnea.  Objective:    Filed Vitals:   03/16/16 0400 03/16/16 0500 03/16/16 0600 03/16/16 0700  BP: 99/71 117/71 100/59 121/63  Pulse:      Temp:      TempSrc:      Resp: '16 17 22 27  '$ Height:      Weight:      SpO2: 94% 93% 93% 93%    Intake/Output Summary (Last 24 hours) at 03/16/16 0732 Last  data filed at 03/16/16 0700  Gross per 24 hour  Intake    690 ml  Output    375 ml  Net    315 ml   Filed Weights   03/15/16 1830  Weight: 59 kg (130 lb 1.1 oz)    Exam: Gen:  NAD Cardiovascular:  RRR, No JVD Chest and lungs:   Good air movement with diffuse crackles. Abdomen:  Abdomen soft, NT/ND, + BS Extremities:  No edema   Data Reviewed:    Labs: Basic Metabolic Panel:  Recent Labs Lab 03/15/16 1539 03/16/16 0305  NA 141 141  K 4.1 4.3  CL 104 105    CO2 28 28  GLUCOSE 140* 118*  BUN 12 10  CREATININE 0.66 0.70  CALCIUM 8.6* 8.5*   GFR Estimated Creatinine Clearance: 61.8 mL/min (by C-G formula based on Cr of 0.7). Liver Function Tests:  Recent Labs Lab 03/15/16 1539  AST 53*  ALT 10*  ALKPHOS 81  BILITOT 0.5  PROT 5.1*  ALBUMIN 2.4*   No results for input(s): LIPASE, AMYLASE in the last 168 hours. No results for input(s): AMMONIA in the last 168 hours. Coagulation profile  Recent Labs Lab 03/15/16 1852  INR 1.14    CBC:  Recent Labs Lab 03/15/16 1539 03/16/16 0305  WBC 8.0 10.1  NEUTROABS 5.4  --   HGB 10.6* 9.8*  HCT 31.8* 30.1*  MCV 93.8 95.3  PLT 300 288   Cardiac Enzymes: No results for input(s): CKTOTAL, CKMB, CKMBINDEX, TROPONINI in the last 168 hours. BNP (last 3 results) No results for input(s): PROBNP in the last 8760 hours. CBG: No results for input(s): GLUCAP in the last 168 hours. D-Dimer: No results for input(s): DDIMER in the last 72 hours. Hgb A1c: No results for input(s): HGBA1C in the last 72 hours. Lipid Profile: No results for input(s): CHOL, HDL, LDLCALC, TRIG, CHOLHDL, LDLDIRECT in the last 72 hours. Thyroid function studies: No results for input(s): TSH, T4TOTAL, T3FREE, THYROIDAB in the last 72 hours.  Invalid input(s): FREET3 Anemia work up: No results for input(s): VITAMINB12, FOLATE, FERRITIN, TIBC, IRON, RETICCTPCT in the last 72 hours. Sepsis Labs:  Recent Labs Lab 03/15/16 1539 03/15/16 1540 03/16/16 0305  WBC 8.0  --  10.1  LATICACIDVEN  --  1.14  --    Microbiology Recent Results (from the past 240 hour(s))  MRSA PCR Screening     Status: None   Collection Time: 03/15/16  6:35 PM  Result Value Ref Range Status   MRSA by PCR NEGATIVE NEGATIVE Final    Comment:        The GeneXpert MRSA Assay (FDA approved for NASAL specimens only), is one component of a comprehensive MRSA colonization surveillance program. It is not intended to diagnose  MRSA infection nor to guide or monitor treatment for MRSA infections.      Medications:   . antiseptic oral rinse  7 mL Mouth Rinse q12n4p  . ceFEPime (MAXIPIME) IV  1 g Intravenous Q12H  . chlorhexidine  15 mL Mouth Rinse BID  . folic acid  1 mg Oral Daily  . heparin  5,000 Units Subcutaneous 3 times per day  . ipratropium-albuterol  3 mL Nebulization TID  . levothyroxine  75 mcg Oral QAC breakfast  . mirtazapine  15 mg Oral QPM  . morphine  15 mg Oral Q12H  . polyethylene glycol  17 g Oral Daily  . sodium chloride flush  3 mL Intravenous Q12H  . sodium  chloride flush  3 mL Intravenous Q12H  . vancomycin  750 mg Intravenous Q12H   Continuous Infusions:   Time spent: 35 min   LOS: 1 day   Deborah [name]  [facility] Pager [phone]  *Please refer to [email or web address], password [email or web address] to get updated schedule on who will round on this patient, as hospitalists switch teams weekly. If 7PM-7AM, please contact night-coverage at [email or web address], password [email or web address] for any overnight needs.  03/16/2016, 7:32 AM

## 2016-03-16 NOTE — Care Management Note (Signed)
Case Management Note  Patient Details  Name: JENAVEE LAGUARDIA MRN: [id number] Date of Birth: August 08, 1946  Subjective/Objective:  70 y/o f admitted w/Acute resp failure. Hx:DNR, NSCL [place]. From home. Has home 02-will confirm which company provides home 02.  S/p thoracentesis.                Action/Plan:d/c plan home.   Expected Discharge Date:   (unknown)               Expected Discharge Plan:  Home/Self Care  In-House Referral:     Discharge planning Services  CM Consult  Post Acute Care Choice:    Choice offered to:     DME Arranged:    DME Agency:     HH Arranged:    HH Agency:     Status of Service:  In process, will continue to follow  Medicare Important Message Given:    Date Medicare IM Given:    Medicare IM give by:    Date Additional Medicare IM Given:    Additional Medicare Important Message give by:     If discussed at [place] of Stay Meetings, dates discussed:    Additional Comments:  Dessa Phi, RN 03/16/2016, 1:50 PM

## 2016-03-16 NOTE — Sedation Documentation (Signed)
Patient denies pain and is resting comfortably.  

## 2016-03-16 NOTE — Consult Note (Signed)
Consultation Note Date: 03/16/2016   Patient Name: Deborah [name]  DOB: 02/11/1946  MRN: [id number]  Age / Sex: 70 y.o., female  PCP: Biagio Borg, MD Referring Physician: Charlynne Cousins, MD  Reason for Consultation: Establishing goals of care, Non pain symptom management, Pain control and Psychosocial/spiritual support   Clinical Assessment/Narrative:  70 y.o. 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  Today she had pleur-ex cath placed for comfort measures, requiring intermittent BiPap.  Patient and family faced with advanced care decisions and anticipatory care needs   This NP Wadie Lessen reviewed medical records, received report from team, assessed the patient and then meet at the patient's bedside along with her husband   to discuss diagnosis prognosis, GOC, EOL wishes disposition and options.   A detailed discussion was had today regarding advanced directives.  Concepts specific to code status, artifical feeding and hydration, continued IV antibiotics and rehospitalization was had.  The difference between a aggressive medical intervention path  and a palliative comfort care path for this patient at this time was had.  Values and goals of care important to patient and family were attempted to be elicited.   Natural trajectory and expectations at EOL were discussed.  Questions and concerns addressed.  Family encouraged to call with questions or  concerns.  PMT will continue to support holistically.    Primary Decision Maker: Patient with support of family   SUMMARY OF RECOMMENDATIONS  -place Plue-ex cath today for symptom management   -liberalize symptom management to maximize comfort, begin to minimize oral medications - utilize BiPAP as needed though tomorrow morning - remeet with this NP to further clarify plan of care, leaning to desire for hospice facility if eligible   Code Status/Advance Care Planning: DNR    Code Status Orders        Start     Ordered   03/16/16 0855  Do not attempt resuscitation (DNR)   Continuous    Question Answer Comment  Maintain current active treatments Yes   Do not initiate new interventions Yes      03/16/16 0855    Code Status History    Date Active Date Inactive Code Status Order ID Comments User Context   03/15/2016  6:33 PM 03/16/2016  8:55 AM Full Code [id number]  Charlynne Cousins, MD Inpatient   02/11/2016  8:57 PM 02/16/2016  6:49 PM Full Code [id number]  Roney Jaffe, MD Inpatient    Advance Directive Documentation        Most Recent Value   Type of Advance Directive  Living will   Pre-existing out of facility DNR order (yellow form or pink MOST form)     "MOST" Form in Place?         Symptom Management:   Dyspnea/Pain: Morphine 2 mg IV every 1 hr prn                                        BiPap prn  Anxiety: Ativan 1 mg IV every 4 hrs prn  Palliative Prophylaxis:    Aspiration, Bowel Regimen, Frequent Pain Assessment, Oral Care and Turn Reposition   Psycho-social/Spiritual:  Support System: Strong Desire for further Chaplaincy support:no Additional Recommendations: Education on Hospice  Prognosis: < 2 weeks  Discharge Planning: Hospice facility   Chief Complaint/ Primary Diagnoses: Present on Admission:  . Acute respiratory failure with hypoxia ([place]) . Cancer associated pain . Hypoalbuminemia due to protein-calorie malnutrition ([place]) . Non-small  cell carcinoma of right lung - adenocarcinoma . Pleural effusion, malignant  I have reviewed the medical record, interviewed the patient and family, and examined the patient. The following aspects are pertinent.  Past Medical History  Diagnosis Date  . Allergy     Mold  . Hyperlipidemia 08/04/2011  . Hypothyroidism 08/04/2011  . Atrophic vaginitis   . Osteopenia 10/2012    T score -1.7 FRAX 7%/0.3%  . Vertigo   . Shortness of breath dyspnea   . Meniere's disease   . Encounter for antineoplastic chemotherapy 10/28/2015  . 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 Basal cell carcinoma of skin 2010  . Non-small cell lung cancer [facility]) September 2016    Adenocarcinoma  . Malignant neoplasm of right upper lobe of lung ([place]) 08/26/2015   Social History   Social History  . Marital Status: Married    Spouse Name: N/A  . Number of Children: Y  . Years of Education: 14   Occupational History  . Retired    Social History Main Topics  . Smoking status: Never Smoker   . Smokeless tobacco: Never Used  . Alcohol Use: 1.2 oz/week    2 Glasses of wine per week     Comment: 1 glass wine per wk at times  . Drug Use: No  . Sexual Activity:    Partners: Male    Birth Control/ Protection: Surgical     Comment: hyst   Other Topics Concern  . None   Social History Narrative   Regular exercise-yes   Family History  Problem Relation Age of Onset  . Dementia Mother   . Breast cancer Mother     Breast Cancer  . Heart disease Other     Grandparents  . Heart disease Father   . Diabetes Daughter     TYPE 2  . Melanoma Mother    Scheduled Meds: . antiseptic oral rinse  7 mL Mouth Rinse q12n4p  . ceFEPime (MAXIPIME) IV  1 g Intravenous Q12H  . chlorhexidine  15 mL Mouth Rinse BID  . folic acid  1 mg Oral Daily  . heparin  5,000 Units Subcutaneous 3 times per day  . ipratropium-albuterol  3 mL Nebulization TID  . levothyroxine  75 mcg  Oral QAC breakfast  . mirtazapine  15 mg Oral QPM  . morphine  15 mg Oral Q12H  . polyethylene glycol  17 g Oral Daily  . sodium chloride flush  3 mL Intravenous Q12H  . sodium chloride flush  3 mL Intravenous Q12H  . vancomycin  750 mg Intravenous Q12H   Continuous Infusions:  PRN Meds:.sodium chloride, acetaminophen **OR** acetaminophen, HYDROcodone-acetaminophen, HYDROmorphone (DILAUDID) injection, LORazepam, morphine injection, ondansetron **OR** ondansetron (ZOFRAN) IV, prochlorperazine, senna, sodium chloride flush, sorbitol Medications Prior to Admission:  Prior to Admission medications   Medication Sig Start Date End Date Taking? Authorizing Provider  albuterol (PROVENTIL HFA;VENTOLIN HFA) 108 (90 BASE) MCG/ACT inhaler  Inhale 2 puffs into the lungs every 6 (six) hours as needed for wheezing or shortness of breath. 07/28/15  Yes Biagio Borg, MD  feeding supplement, ENSURE ENLIVE, (ENSURE ENLIVE) LIQD Take 237 mLs by mouth 2 (two) times daily between meals. 02/16/16  Yes Albertine Patricia, MD  folic acid (FOLVITE) 1 MG tablet TAKE 1 TABLET (1 MG TOTAL) BY MOUTH DAILY. 03/03/16  Yes Curt Bears, MD  guaiFENesin (MUCINEX) 600 MG 12 hr tablet Take 2 tablets (1,200 mg total) by mouth 2 (two) times daily. 02/16/16  Yes Albertine Patricia, MD  HYDROcodone-acetaminophen (NORCO/VICODIN) 5-325 MG tablet May take 1/2 tab up to max of 2 tabs PO Q 6 hours break thru pain. Patient taking differently: Take 0.5 tablets by mouth every 6 (six) hours as needed for moderate pain.  02/29/16  Yes Susanne Borders, NP  ipratropium-albuterol (DUONEB) 0.5-2.5 (3) MG/3ML SOLN Take 3 mLs by nebulization 3 (three) times daily. 02/16/16  Yes Dawood S Elgergawy, MD  levothyroxine (SYNTHROID, LEVOTHROID) 75 MCG tablet TAKE 1 TABLET BY MOUTH DAILY. 08/06/14  Yes Biagio Borg, MD  LORazepam (ATIVAN) 0.5 MG tablet Take one every eight hours as needed for anxiety.  Prefers one at bedtime. Patient taking differently: Take 0.5  mg by mouth at bedtime as needed for anxiety. Take one every eight hours as needed for anxiety.  Prefers one at bedtime. 02/21/16  Yes Curt Bears, MD  mirtazapine (REMERON) 15 MG tablet Take 15 mg by mouth every evening. 03/07/16  Yes Historical Provider, MD  morphine (MS CONTIN) 15 MG 12 hr tablet Take 1 tablet (15 mg total) by mouth every 12 (twelve) hours. 02/29/16  Yes Susanne Borders, NP  ondansetron (ZOFRAN) 8 MG tablet Take 1 tablet (8 mg total) by mouth every 8 (eight) hours as needed for nausea or vomiting. 11/02/15  Yes Curt Bears, MD  polyethylene glycol [facility] / GLYCOLAX) packet Take 17 g by mouth daily as needed. Patient taking differently: Take 17 g by mouth daily as needed for mild constipation.  02/16/16  Yes Albertine Patricia, MD  prochlorperazine (COMPAZINE) 10 MG tablet Take 1 tablet (10 mg total) by mouth every 6 (six) hours as needed for nausea or vomiting. 09/10/15  Yes Curt Bears, MD  UNABLE TO FIND Wheelchair for assistance with ambulation 02/24/16  Yes Curt Bears, MD  dexamethasone (DECADRON) 4 MG tablet 4 mg po bid the day before, day of and day after chemo Patient taking differently: Take 4 mg by mouth 2 (two) times daily. '4mg'$  twice daily  day before, day of and day after chemo 09/10/15   Curt Bears, MD  HYDROcodone-homatropine [facility]) 5-1.5 MG/5ML syrup Take 5 mLs by mouth every 6 (six) hours as needed for cough. 09/27/15   Curt Bears, MD  levofloxacin (LEVAQUIN) 500 MG tablet Take 1 tablet (500 mg total) by mouth daily. Patient not taking: Reported on 03/15/2016 02/29/16   Susanne Borders, NP  senna (SENOKOT) 8.6 MG TABS tablet Take 1 tablet (8.6 mg total) by mouth daily. 02/16/16   Silver Huguenin Elgergawy, MD   No Known Allergies  Review of Systems  Constitutional: Positive for activity change and appetite change.  Respiratory: Positive for apnea and shortness of breath.     Physical Exam  Constitutional: She appears ill.  -on BiPap    Cardiovascular: Tachycardia present.   Respiratory: Accessory muscle usage present. Tachypnea noted. She is in respiratory distress. She has decreased breath sounds in the right lower  field and the left lower field.  Skin: Skin is warm and dry.    Vital Signs: BP 130/75 mmHg  Pulse 115  Temp(Src) 98.3 F (36.8 C) (Oral)  Resp 23  Ht '5\' 9"'$  (1.753 m)  Wt 59 kg (130 lb 1.1 oz)  BMI 19.20 kg/m2  SpO2 92%  LMP 10/11/1979  SpO2: SpO2: 92 % O2 Device:SpO2: 92 % O2 Flow Rate: .O2 Flow Rate (L/min): 15 L/min  IO: Intake/output summary:  Intake/Output Summary (Last 24 hours) at 03/16/16 1036 Last data filed at 03/16/16 0700  Gross per 24 hour  Intake    690 ml  Output    375 ml  Net    315 ml    LBM: Last BM Date: 03/15/16 Baseline Weight: Weight: 59 kg (130 lb 1.1 oz) Most recent weight: Weight: 59 kg (130 lb 1.1 oz)      Palliative Assessment/Data:    Additional Data Reviewed:  CBC:    Component Value Date/Time   WBC 10.1 03/16/2016 0305   WBC 10.3 02/29/2016 1318   HGB 9.8* 03/16/2016 0305   HGB 11.6 02/29/2016 1318   HCT 30.1* 03/16/2016 0305   HCT 35.0 02/29/2016 1318   PLT 288 03/16/2016 0305   PLT 249 02/29/2016 1318   MCV 95.3 03/16/2016 0305   MCV 93.1 02/29/2016 1318   NEUTROABS 5.4 03/15/2016 1539   NEUTROABS 7.8* 02/29/2016 1318   LYMPHSABS 1.1 03/15/2016 1539   LYMPHSABS 1.9 02/29/2016 1318   MONOABS 1.4* 03/15/2016 1539   MONOABS 0.5 02/29/2016 1318   EOSABS 0.1 03/15/2016 1539   EOSABS 0.0 02/29/2016 1318   BASOSABS 0.1 03/15/2016 1539   BASOSABS 0.0 02/29/2016 1318   Comprehensive Metabolic Panel:    Component Value Date/Time   NA 141 03/16/2016 0305   NA 136 02/29/2016 1319   K 4.3 03/16/2016 0305   K 4.4 02/29/2016 1319   CL 105 03/16/2016 0305   CO2 28 03/16/2016 0305   CO2 26 02/29/2016 1319   BUN 10 03/16/2016 0305   BUN 12.5 02/29/2016 1319   CREATININE 0.70 03/16/2016 0305   CREATININE 0.8 02/29/2016 1319   GLUCOSE 118*  03/16/2016 0305   GLUCOSE 107 02/29/2016 1319   CALCIUM 8.5* 03/16/2016 0305   CALCIUM 9.5 02/29/2016 1319   AST 53* 03/15/2016 1539   AST 49* 02/29/2016 1319   ALT 10* 03/15/2016 1539   ALT 11 02/29/2016 1319   ALKPHOS 81 03/15/2016 1539   ALKPHOS 107 02/29/2016 1319   BILITOT 0.5 03/15/2016 1539   BILITOT 1.15 02/29/2016 1319   PROT 5.1* 03/15/2016 1539   PROT 6.0* 02/29/2016 1319   ALBUMIN 2.4* 03/15/2016 1539   ALBUMIN 2.4* 02/29/2016 1319   Discussed with Dr Venetia Constable  Time In: 1200 Time Out: 1315 Time Total: 75 min Greater than 50%  of this time was spent counseling and coordinating care related to the above assessment and plan.  Signed by: Wadie Lessen, NP  Knox Royalty, NP  03/16/2016, 10:36 AM  Please contact Palliative Medicine Team phone at [phone] for questions and concerns.

## 2016-03-16 NOTE — Consult Note (Signed)
Name: Deborah [name] MRN: [id number] DOB: February 18, 1946    ADMISSION DATE:  03/15/2016 CONSULTATION DATE:  3/30  REFERRING MD :  [email or web address]   CHIEF COMPLAINT:  Respiratory failure, lung ca   BRIEF PATIENT DESCRIPTION:  70 yo female never smoker with hx stage IV NSC RUL lung cancer with malignant effusions presented 3/29 with cough, fever, increased dyspnea.  She was admitted by [facility] with acute respiratory failure r/t increasing malignant effusion with possible HCAP and required bipap for increased WOB.  Follow up CTA chest revealed severe lymphangitic spread and marked increase in size of R lung mass now involving most of the R lung with associated bilateral pleural effusions. PCCM was consulted for further recs.   SIGNIFICANT EVENTS    STUDIES:  3/29 CTA chest >>>  1. No pulmonary embolism.  2. Severe lymphangitic carcinomatosis throughout both lungs, worsened. Masslike consolidation involving most of the right lung, markedly increased since 02/10/2016, which likely predominantly represents significant tumor progression. A component of postobstructive pneumonia cannot be excluded. 3. Malignant bilateral pleural effusions, stable and small on the right and moderate and increased on the left. 4. Diffuse moth-eaten appearance of the thoracic skeleton, favor infiltrative lytic osseous metastases, slightly worsened.  5. Stable mild pericardial fluid/thickening.  6. Bilateral supraclavicular, subcarinal and bilateral hilar lymphadenopathy, increased at the right hilum. 3/30 2D echo>>>   HISTORY OF PRESENT ILLNESS:  70 yo female with hx stage IV NSC RUL lung cancer with malignant effusions presented 3/29 with cough, fever, increased dyspnea.  She was admitted by [facility] with acute respiratory failure r/t increasing malignant effusion with possible HCAP and required bipap for increased WOB.  Follow up CTA chest revealed severe lymphangitic spread and marked increase in size of R lung mass now  involving most of the R lung with associated bilateral pleural effusions. PCCM was consulted for further recs.   Pt currently feeling much better than on admission.  She denies SOB on bipap.  Still c/o cough and general malaise.  Denies chest pain, hemoptysis, purulent sputum, syncope.   PAST MEDICAL HISTORY :   has a past medical history of Allergy; Hyperlipidemia (08/04/2011); Hypothyroidism (08/04/2011); Atrophic vaginitis; Osteopenia (10/2012); Vertigo; Shortness of breath dyspnea; Meniere's disease; Encounter for antineoplastic chemotherapy (10/28/2015); Cancer ([place]) (1999); Basal cell carcinoma of skin (2010); Non-small cell lung cancer [facility]) (September 2016); and Malignant neoplasm of right upper lobe of lung ([place]) (08/26/2015).  has past surgical history that includes Tonsillectomy (1952); Vaginal hysterectomy (1980); Breast biopsy (1999); Breast lumpectomy (Right, 1998); and Chest tube insertion (Right, 09/15/2015). Prior to Admission medications   Medication Sig Start Date End Date Taking? Authorizing Provider  albuterol (PROVENTIL HFA;VENTOLIN HFA) 108 (90 BASE) MCG/ACT inhaler Inhale 2 puffs into the lungs every 6 (six) hours as needed for wheezing or shortness of breath. 07/28/15  Yes Biagio Borg, MD  feeding supplement, ENSURE ENLIVE, (ENSURE ENLIVE) LIQD Take 237 mLs by mouth 2 (two) times daily between meals. 02/16/16  Yes Albertine Patricia, MD  folic acid (FOLVITE) 1 MG tablet TAKE 1 TABLET (1 MG TOTAL) BY MOUTH DAILY. 03/03/16  Yes Curt Bears, MD  guaiFENesin (MUCINEX) 600 MG 12 hr tablet Take 2 tablets (1,200 mg total) by mouth 2 (two) times daily. 02/16/16  Yes Albertine Patricia, MD  HYDROcodone-acetaminophen (NORCO/VICODIN) 5-325 MG tablet May take 1/2 tab up to max of 2 tabs PO Q 6 hours break thru pain. Patient taking differently: Take 0.5 tablets by mouth every 6 (six) hours as  needed for moderate pain.  02/29/16  Yes Susanne Borders, NP  ipratropium-albuterol (DUONEB) 0.5-2.5 (3)  MG/3ML SOLN Take 3 mLs by nebulization 3 (three) times daily. 02/16/16  Yes Dawood S Elgergawy, MD  levothyroxine (SYNTHROID, LEVOTHROID) 75 MCG tablet TAKE 1 TABLET BY MOUTH DAILY. 08/06/14  Yes Biagio Borg, MD  LORazepam (ATIVAN) 0.5 MG tablet Take one every eight hours as needed for anxiety.  Prefers one at bedtime. Patient taking differently: Take 0.5 mg by mouth at bedtime as needed for anxiety. Take one every eight hours as needed for anxiety.  Prefers one at bedtime. 02/21/16  Yes Curt Bears, MD  mirtazapine (REMERON) 15 MG tablet Take 15 mg by mouth every evening. 03/07/16  Yes Historical Provider, MD  morphine (MS CONTIN) 15 MG 12 hr tablet Take 1 tablet (15 mg total) by mouth every 12 (twelve) hours. 02/29/16  Yes Susanne Borders, NP  ondansetron (ZOFRAN) 8 MG tablet Take 1 tablet (8 mg total) by mouth every 8 (eight) hours as needed for nausea or vomiting. 11/02/15  Yes Curt Bears, MD  polyethylene glycol [facility] / GLYCOLAX) packet Take 17 g by mouth daily as needed. Patient taking differently: Take 17 g by mouth daily as needed for mild constipation.  02/16/16  Yes Albertine Patricia, MD  prochlorperazine (COMPAZINE) 10 MG tablet Take 1 tablet (10 mg total) by mouth every 6 (six) hours as needed for nausea or vomiting. 09/10/15  Yes Curt Bears, MD  UNABLE TO FIND Wheelchair for assistance with ambulation 02/24/16  Yes Curt Bears, MD  dexamethasone (DECADRON) 4 MG tablet 4 mg po bid the day before, day of and day after chemo Patient taking differently: Take 4 mg by mouth 2 (two) times daily. '4mg'$  twice daily  day before, day of and day after chemo 09/10/15   Curt Bears, MD  HYDROcodone-homatropine [facility]) 5-1.5 MG/5ML syrup Take 5 mLs by mouth every 6 (six) hours as needed for cough. 09/27/15   Curt Bears, MD  levofloxacin (LEVAQUIN) 500 MG tablet Take 1 tablet (500 mg total) by mouth daily. Patient not taking: Reported on 03/15/2016 02/29/16   Susanne Borders, NP  senna  (SENOKOT) 8.6 MG TABS tablet Take 1 tablet (8.6 mg total) by mouth daily. 02/16/16   Albertine Patricia, MD   No Known Allergies  FAMILY HISTORY:  family history includes Breast cancer in her mother; Dementia in her mother; Diabetes in her daughter; Heart disease in her father and other; Melanoma in her mother. SOCIAL HISTORY:  reports that she has never smoked. She has never used smokeless tobacco. She reports that she drinks about 1.2 oz of alcohol per week. She reports that she does not use illicit drugs.  REVIEW OF SYSTEMS:   As per HPI - All other systems reviewed and were neg.    SUBJECTIVE:   VITAL SIGNS: Temp:  [97.4 F (36.3 C)-98.3 F (36.8 C)] 98.3 F (36.8 C) (03/29 1953) Pulse Rate:  [111-126] 115 (03/29 2005) Resp:  [16-37] 23 (03/30 0800) BP: (99-186)/(59-89) 130/75 mmHg (03/30 0800) SpO2:  [91 %-99 %] 92 % (03/30 0818) FiO2 (%):  [50 %-100 %] 50 % (03/30 0818) Weight:  [59 kg (130 lb 1.1 oz)] 59 kg (130 lb 1.1 oz) (03/29 1830)  PHYSICAL EXAMINATION: General:  Pleasant, chronically ill appearing female, NAD on bipap  Neuro:  Awake, alert, appropriate, MAE  HEENT:  Mm dry, bipap  Cardiovascular:  s1s2 rrr Lungs:  resps even non labored on bipap,  diminished R>L Abdomen:  Round, soft, non tender  Musculoskeletal:  Warm and dry, scant BLE edema    Recent Labs Lab 03/15/16 1539 03/16/16 0305  NA 141 141  K 4.1 4.3  CL 104 105  CO2 28 28  BUN 12 10  CREATININE 0.66 0.70  GLUCOSE 140* 118*    Recent Labs Lab 03/15/16 1539 03/16/16 0305  HGB 10.6* 9.8*  HCT 31.8* 30.1*  WBC 8.0 10.1  PLT 300 288   Ct Angio Chest Pe W/cm &/or Wo Cm  03/15/2016  CLINICAL DATA:  Stage IV right lung adenocarcinoma on chemotherapy, recently treated for healthcare associated pneumonia, with worsening dyspnea, fever and productive cough. EXAM: CT ANGIOGRAPHY CHEST WITH CONTRAST TECHNIQUE: Multidetector CT imaging of the chest was performed using the standard protocol during  bolus administration of intravenous contrast. Multiplanar CT image reconstructions and MIPs were obtained to evaluate the vascular anatomy. CONTRAST:  100 cc Isovue 370 IV. COMPARISON:  Chest radiograph from earlier today. 02/10/2016 chest CT. FINDINGS: Mediastinum/Nodes: The study is high quality for the evaluation of pulmonary embolism. There are no filling defects in the central, lobar, segmental or subsegmental pulmonary artery branches to suggest acute pulmonary embolism. Great vessels are normal in course and caliber. Normal heart size. Stable mild pericardial fluid/ thickening. Normal visualized thyroid. Normal esophagus. Stable mildly enlarged bilateral supraclavicular nodes, largest 1.0 cm bilaterally. No axillary adenopathy. Stable mildly enlarged 1.1 cm subcarinal node (series 4/ image 46). Bilateral confluent hilar adenopathy appears stable on the left and increased on the right. Lungs/Pleura: No pneumothorax. Stable small right pleural effusion with diffuse right pleural thickening and enhancement. Moderate left pleural effusion is increased, with scattered areas of left pleural thickening and enhancement. There is masslike consolidation with air bronchograms involving most of the right lung, which has significantly increased since 02/10/2016, for example measuring 10.7 x 8.8 cm in the right upper lobe (series 7/image 39), compared to 5.2 x 3.5 cm on 02/10/2016 at a comparable level, markedly increased, favor progression of tumor. There is consolidation and volume loss in the basilar left lower lobe, probably a combination of atelectasis and tumor. There is severe interlobular septal thickening and peribronchovascular, subpleural nodularity throughout both lungs, which appears worsened, in keeping with lymphangitic carcinomatosis. Upper abdomen: Limited visualized portions of the upper abdomen appear unremarkable. Musculoskeletal: There is a diffuse moth-eaten appearance of the entire thoracic skeleton,  likely representing infiltrative lytic osseous metastases, with areas of cortical erosion as best seen at the posterior manubrium (series 603/ image 69), which appears slightly worsened since 02/10/2016. Moderate degenerative changes in the thoracic spine. Review of the MIP images confirms the above findings. IMPRESSION: 1. No pulmonary embolism. 2. Severe lymphangitic carcinomatosis throughout both lungs, worsened. Masslike consolidation involving most of the right lung, markedly increased since 02/10/2016, which likely predominantly represents significant tumor progression. A component of postobstructive pneumonia cannot be excluded. 3. Malignant bilateral pleural effusions, stable and small on the right and moderate and increased on the left. 4. Diffuse moth-eaten appearance of the thoracic skeleton, favor infiltrative lytic osseous metastases, slightly worsened. 5. Stable mild pericardial fluid/thickening. 6. Bilateral supraclavicular, subcarinal and bilateral hilar lymphadenopathy, increased at the right hilum. Electronically Signed   By: Ilona Sorrel M.D.   On: 03/15/2016 21:21   Dg Chest Port 1 View  03/15/2016  CLINICAL DATA:  Shortness of breath EXAM: PORTABLE CHEST 1 VIEW COMPARISON:  02/29/2016 FINDINGS: Worsening airspace opacities throughout much of the right lung and left base concerning for worsening  pneumonia. Enlarging left pleural effusion, now moderate. Small right pleural effusion. Heart is normal size. IMPRESSION: Worsening bilateral airspace disease, or concerning for worsening pneumonia. Enlarging left effusion, now moderate. Electronically Signed   By: Rolm Baptise M.D.   On: 03/15/2016 15:35    ASSESSMENT / PLAN:  Acute on chronic hypoxic respiratory failure - multifactorial in setting stage IV NSC lung cancer with significant progression and severe lymphangitic spread, malignant effusions +/- post-obstructive PNA.   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  She remains on bipap but is able to communicate well, alert and oriented.  Her oncologist Dr. Earlie Server was in this morning and discussed her CT results with her and she understands the significant progression of her cancer and little more to offer medically.  She expressed to Dr. Earlie Server and to myself that she would not want further aggressive care, including intubation, and would like to begin focusing more on comfort and symptom management.  She is waiting for her husband to return and I will discuss further with him when he arrives.  For now we will continue bipap and abx but may want to begin transition to full comfort including pleur-x catheter for symptom management.     Nickolas Madrid, NP 03/16/2016  9:11 AM Pager: [phone] or [phone]  Attending Note:  I have examined patient, reviewed labs, studies and notes. I have discussed the case with Shon Millet, and I agree with the data and plans as amended above. Stage IV NSCLCA with significant progression and new moderate L effusion, evolving acute resp failure. She appears to be more comfortable on BiPAP, is able to converse. She is clear that she would not want invasive care in absence of any opportunity to make her underlying cancer prognosis better. I agree with this. BiPAp seems to be a good tool at least at present to manage her WOB. We will likely need to incorporate other symptom-based strategies as well, including narcotics. I have also discussed with her possible L pleurex catheter placement to address the dyspnea associated w pleural fluid.  She  and her husband are open to this. Most importantly we will indicate in teh orders that her code status is DNI/R.  Independent critical care time is 50 minutes.   Baltazar Apo, MD, PhD 03/16/2016, 11:10 AM [facility] [phone] or if no answer [phone]

## 2016-03-16 NOTE — Progress Notes (Signed)
Rt took pt off BIPAP and placed on 10 LPM HFNC. Pt doing well at this time.

## 2016-03-16 NOTE — Progress Notes (Signed)
*  PRELIMINARY RESULTS* Echocardiogram 2D Echocardiogram has been performed.  Leavy Cella 03/16/2016, 8:56 AM

## 2016-03-16 NOTE — Progress Notes (Signed)
RT had to place pt back on BIPAP due respiratory distress.

## 2016-03-16 NOTE — Procedures (Signed)
Interventional Radiology Procedure Note  Procedure: Placement LEFT PleurX drain.  Initial drainage yields 1.5 L bloody pleural fluid.   Complications: None  Estimated Blood Loss: 0  Recommendations: - Drain daily  Signed,  Criselda Peaches, MD

## 2016-03-16 NOTE — Progress Notes (Signed)
   03/16/16 1100  Clinical Encounter Type  Visited With Patient and family together  Visit Type Initial;Psychological support;Spiritual support;Critical Care  Referral From Nurse  Consult/Referral To Chaplain  Spiritual Encounters  Spiritual Needs Emotional;Other (Comment);Prayer (Pastoral Conversation/Support)  Stress Factors  Patient Stress Factors Other (Comment);Health changes (Breathing Difficulties)  Family Stress Factors Health changes;Loss;Other (Comment) (Grief)   I met with the patient and her daughter. The patient was having a hard time breathing, and stated that her main concern was being able to catch her breath.  The patient's eldest daughter was at her bedside and tearful. The patient was struggling too much to talk, so I offered to come back at another time; which she responded to by nodding for yes.  The daughter followed me out of the room and stated that she needed to talk. We walked to the [place] and got her mom/patient a prayer shawl.  The patient's daughter stated that she and her mother are extremely close and that she is fearful of losing her mother. She understands her mother's condition and the prognosis, but stated that it is still difficult to grasp. The daughter said that she knows that care decisions need to be made, but doesn't want to step on the patient's husband's toes.  The daughter states that she has good support from co-workers, family and her partner. Prayer and her spirituality are very important to her. The patient's daughter is wanting to know if her mother takes comfort in her [employer] faith, but told me that she finds it hard to ask her those questions.  Patient grew up [place].  The patient and her daughter will need follow up. I will continue to follow this patient and her family.  Chaplain interventions included pastoral conversation, giving of prayer shawl, spiritual assessment, assessment of support resources and prayer.   Please call the  Department of Spiritual Care for further assistance.   [place] M.Div.

## 2016-03-16 NOTE — Progress Notes (Signed)
Patient ID: Deborah [name], female   DOB: 12-23-1945, 70 y.o.   MRN: [id number]    Referring Physician(s): Byrum,Robert  Supervising Physician: Jacqulynn Cadet  Chief Complaint:  Dyspnea, left pleural effusion, lung cancer  Subjective:  Pt familiar to IR service from prior right thoracentesis on 08/24/2015. She has known stage IV NSC lung cancer with significant progression and new moderate left pleural effusion and evolving acute respiratory failure (on BIPAP). She is in process of focusing more on comfort care/symptom management and request now received for left pleurx catheter placement. Pt did have rt pleurx cath placed by Dr. Roxan Hockey on 09/15/15 which was removed in November 2016 due to no significant drainage. Pt has in past and is still being treated empirically for pneumonia as well. She currently denies fever/chills, CP, abd/back pain, N/V, or abnormal bleeding. She does have cough, dyspnea.   Allergies: Review of patient's allergies indicates no known allergies.  Medications: Prior to Admission medications   Medication Sig Start Date End Date Taking? Authorizing Provider  albuterol (PROVENTIL HFA;VENTOLIN HFA) 108 (90 BASE) MCG/ACT inhaler Inhale 2 puffs into the lungs every 6 (six) hours as needed for wheezing or shortness of breath. 07/28/15  Yes Biagio Borg, MD  feeding supplement, ENSURE ENLIVE, (ENSURE ENLIVE) LIQD Take 237 mLs by mouth 2 (two) times daily between meals. 02/16/16  Yes Albertine Patricia, MD  folic acid (FOLVITE) 1 MG tablet TAKE 1 TABLET (1 MG TOTAL) BY MOUTH DAILY. 03/03/16  Yes Curt Bears, MD  guaiFENesin (MUCINEX) 600 MG 12 hr tablet Take 2 tablets (1,200 mg total) by mouth 2 (two) times daily. 02/16/16  Yes Albertine Patricia, MD  HYDROcodone-acetaminophen (NORCO/VICODIN) 5-325 MG tablet May take 1/2 tab up to max of 2 tabs PO Q 6 hours break thru pain. Patient taking differently: Take 0.5 tablets by mouth every 6 (six) hours as needed for  moderate pain.  02/29/16  Yes Susanne Borders, NP  ipratropium-albuterol (DUONEB) 0.5-2.5 (3) MG/3ML SOLN Take 3 mLs by nebulization 3 (three) times daily. 02/16/16  Yes Dawood S Elgergawy, MD  levothyroxine (SYNTHROID, LEVOTHROID) 75 MCG tablet TAKE 1 TABLET BY MOUTH DAILY. 08/06/14  Yes Biagio Borg, MD  LORazepam (ATIVAN) 0.5 MG tablet Take one every eight hours as needed for anxiety.  Prefers one at bedtime. Patient taking differently: Take 0.5 mg by mouth at bedtime as needed for anxiety. Take one every eight hours as needed for anxiety.  Prefers one at bedtime. 02/21/16  Yes Curt Bears, MD  mirtazapine (REMERON) 15 MG tablet Take 15 mg by mouth every evening. 03/07/16  Yes Historical Provider, MD  morphine (MS CONTIN) 15 MG 12 hr tablet Take 1 tablet (15 mg total) by mouth every 12 (twelve) hours. 02/29/16  Yes Susanne Borders, NP  ondansetron (ZOFRAN) 8 MG tablet Take 1 tablet (8 mg total) by mouth every 8 (eight) hours as needed for nausea or vomiting. 11/02/15  Yes Curt Bears, MD  polyethylene glycol [facility] / GLYCOLAX) packet Take 17 g by mouth daily as needed. Patient taking differently: Take 17 g by mouth daily as needed for mild constipation.  02/16/16  Yes Albertine Patricia, MD  prochlorperazine (COMPAZINE) 10 MG tablet Take 1 tablet (10 mg total) by mouth every 6 (six) hours as needed for nausea or vomiting. 09/10/15  Yes Curt Bears, MD  UNABLE TO FIND Wheelchair for assistance with ambulation 02/24/16  Yes Curt Bears, MD  dexamethasone (DECADRON) 4 MG tablet 4 mg po  bid the day before, day of and day after chemo Patient taking differently: Take 4 mg by mouth 2 (two) times daily. '4mg'$  twice daily  day before, day of and day after chemo 09/10/15   Curt Bears, MD  HYDROcodone-homatropine [facility]) 5-1.5 MG/5ML syrup Take 5 mLs by mouth every 6 (six) hours as needed for cough. 09/27/15   Curt Bears, MD  levofloxacin (LEVAQUIN) 500 MG tablet Take 1 tablet (500 mg total) by  mouth daily. Patient not taking: Reported on 03/15/2016 02/29/16   Susanne Borders, NP  senna (SENOKOT) 8.6 MG TABS tablet Take 1 tablet (8.6 mg total) by mouth daily. 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 EXAM: CT ANGIOGRAPHY CHEST WITH CONTRAST TECHNIQUE: Multidetector CT imaging of the chest was performed using the standard protocol during bolus administration of intravenous contrast. Multiplanar CT image reconstructions and MIPs were obtained to evaluate the vascular anatomy. CONTRAST:  100 cc Isovue 370 IV. COMPARISON:  Chest radiograph from earlier today. 02/10/2016 chest CT. FINDINGS: Mediastinum/Nodes: The study is high quality for the evaluation of pulmonary embolism. There are no filling defects in the central, lobar, segmental or subsegmental pulmonary artery branches to suggest acute pulmonary embolism. Great vessels are normal in course and caliber. Normal heart size. Stable mild pericardial fluid/ thickening. Normal visualized thyroid. Normal esophagus. Stable mildly enlarged bilateral supraclavicular nodes, largest 1.0 cm bilaterally. No axillary adenopathy. Stable mildly enlarged 1.1 cm subcarinal node (series 4/ image 46). Bilateral confluent hilar adenopathy appears stable on the left and increased on the right. Lungs/Pleura: No pneumothorax. Stable small right pleural effusion with diffuse right pleural thickening and enhancement. Moderate left pleural effusion is  increased, with scattered areas of left pleural thickening and enhancement. There is masslike consolidation with air bronchograms involving most of the right lung, which has significantly increased since 02/10/2016, for example measuring 10.7 x 8.8 cm in the right upper lobe (series 7/image 39), compared to 5.2 x 3.5 cm on 02/10/2016 at a comparable level, markedly increased, favor progression of tumor. There is consolidation and volume loss in the basilar left lower lobe, probably a combination of atelectasis and tumor. There is severe interlobular septal thickening and peribronchovascular, subpleural nodularity throughout both lungs, which appears worsened, in keeping with lymphangitic carcinomatosis. Upper abdomen: Limited visualized portions of the upper abdomen appear unremarkable. Musculoskeletal: There is a diffuse moth-eaten appearance of the entire thoracic skeleton, likely representing infiltrative lytic osseous metastases, with areas of cortical erosion as best seen at the posterior manubrium (series 603/ image 69), which appears slightly worsened since 02/10/2016. Moderate degenerative changes in the thoracic spine. Review of the MIP images confirms the above findings. IMPRESSION: 1. No pulmonary embolism. 2. Severe lymphangitic carcinomatosis throughout both lungs, worsened. Masslike consolidation involving most of the right lung, markedly increased since 02/10/2016, which likely predominantly represents significant tumor progression. A component of postobstructive pneumonia cannot be excluded. 3. Malignant bilateral pleural effusions,  stable and small on the right and moderate and increased on the left. 4. Diffuse moth-eaten appearance of the thoracic skeleton, favor infiltrative lytic osseous metastases, slightly worsened. 5. Stable mild pericardial fluid/thickening. 6. Bilateral supraclavicular, subcarinal and bilateral hilar lymphadenopathy, increased at the right hilum. Electronically Signed   By:  Ilona Sorrel M.D.   On: 03/15/2016 21:21   Dg Chest Port 1 View  03/15/2016  CLINICAL DATA:  Shortness of breath EXAM: PORTABLE CHEST 1 VIEW COMPARISON:  02/29/2016 FINDINGS: Worsening airspace opacities throughout much of the right lung and left base concerning for worsening pneumonia. Enlarging left pleural effusion, now moderate. Small right pleural effusion. Heart is normal size. IMPRESSION: Worsening bilateral airspace disease, or concerning for worsening pneumonia. Enlarging left effusion, now moderate. Electronically Signed   By: Rolm Baptise M.D.   On: 03/15/2016 15:35    Labs:  CBC:  Recent Labs  02/24/16 0923 02/29/16 1318 03/15/16 1539 03/16/16 0305  WBC 6.7 10.3 8.0 10.1  HGB 11.0* 11.6 10.6* 9.8*  HCT 33.3* 35.0 31.8* 30.1*  PLT 263 249 300 288    COAGS:  Recent Labs  09/15/15 1210 03/15/16 1852  INR 1.08 1.14  APTT 29  --     BMP:  Recent Labs  02/15/16 0419 02/16/16 0430 02/24/16 0923 02/29/16 1319 03/15/16 1539 03/16/16 0305  NA 141 140 140 136 141 141  K 4.1 3.8 4.1 4.4 4.1 4.3  CL 108 102  --   --  104 105  CO2 '27 30 24 26 28 28  '$ GLUCOSE 98 98 89 107 140* 118*  BUN <5* 7 10.2 12.'5 12 10  '$ CALCIUM 8.6* 9.0 9.3 9.5 8.6* 8.5*  CREATININE 0.93 0.83 0.7 0.8 0.66 0.70  GFRNONAA >60 >60  --   --  >60 >60  GFRAA >60 >60  --   --  >60 >60    LIVER FUNCTION TESTS:  Recent Labs  02/16/16 0430 02/24/16 0923 02/29/16 1319 03/15/16 1539  BILITOT 0.6 0.34 1.15 0.5  AST 44* 49* 49* 53*  ALT '22 10 11 '$ 10*  ALKPHOS 109 112 107 81  PROT 5.0* 5.7* 6.0* 5.1*  ALBUMIN 2.5* 2.6* 2.4* 2.4*    Assessment and Plan: Pt with stage IV NSC lung cancer, symptomatic malignant pleural effusions, prior rt pleurx 2016- since removed; now with progressive disease, new mod left effusion; request received for left pleurx cath placement ; transition to comfort care in process. Imaging studies were reviewed by Dr. Laurence Ferrari. Details/risks of pleurx cath placement, incl  but not limited to, internal bleeding, infection, pneumothorax, inability to drain all fluid d/w pt/husband with their understanding and consent. Procedure tent planned for later today.    Electronically Signed: D. Rowe Robert 03/16/2016, 11:58 AM   I spent a total of 30 minutes at the the patient's bedside AND on the patient's hospital floor or unit, greater than 50% of which was counseling/coordinating care for left pleurx catheter placement

## 2016-03-16 NOTE — Progress Notes (Signed)
Pt placed on BIPAP/NIV per MD order in ICU. ABG pending.

## 2016-03-16 NOTE — Care Management Note (Signed)
Case Management Note  Patient Details  Name: Deborah [name] MRN: [id number] Date of Birth: 1946/03/03  Subjective/Objective:  Noted-IR-placed pleurx cath. If home pleurx cath needed can arrange w/[facility] orders, & yellow form completed,along w/cannisters in rm.                  Action/Plan:d/c plan home.   Expected Discharge Date:   (unknown)               Expected Discharge Plan:  Home/Self Care  In-House Referral:     Discharge planning Services  CM Consult  Post Acute Care Choice:    Choice offered to:     DME Arranged:    DME Agency:     HH Arranged:    HH Agency:     Status of Service:  In process, will continue to follow  Medicare Important Message Given:    Date Medicare IM Given:    Medicare IM give by:    Date Additional Medicare IM Given:    Additional Medicare Important Message give by:     If discussed at [place] of Stay Meetings, dates discussed:    Additional Comments:  Dessa Phi, RN 03/16/2016, 1:57 PM

## 2016-03-16 NOTE — Progress Notes (Signed)
Initial Nutrition Assessment  DOCUMENTATION CODES:   Severe malnutrition in context of acute illness/injury  INTERVENTION:   - Provide Ensure Enlive po BID, each supplement provides 350 kcals and 20 grams of protein. - Will continue to monitor for nutritional needs.  NUTRITION DIAGNOSIS:   Inadequate oral intake related to poor appetite (fatigue, weakness) as evidenced by per patient/family report.  GOAL:   Patient will meet greater than or equal to 90% of their needs  MONITOR:   PO intake, Supplement acceptance, Weight trends, Labs, GOC  REASON FOR ASSESSMENT:   Malnutrition Screening Tool    ASSESSMENT:   70 y.o. female with PMH of stage IV NSC RUL lung cancer with malignant effusions who recently started a new cycle of chemotherapy with her next treatment on 05/16/2016, recently discharged from the hospital possible HCAP, she continues to have a persistent cough. States her oxygen machine at home has not been working properly and for the last several days her dyspnea has progressively gotten worse and she has had a fever at home of 101.3, with a productive cough. She denies any chest pain, nausea, vomiting, or diarrhea.     3/29 CTA of chest revealed severe lymphangitic spread and marked increase in size of R lung mass, now involving most of the R lung with associated bilateral pleural effusions.   Spoke to patient and husband at bedside.  Pt reports that she has not eaten anything for 2 days.  States that her fatigue and weakness made her have no energy to even try to eat.  Prior to this time, husband states that pt was having small meals each day, like a small bagel with cream cheese or half a pimento cheese sandwich.  Patient reports that she was consuming 2 bottles of Ensure each day.  Patient reports that she is hungry now.  Per RN, patient is only allowed to have ice chips as she is going for a procedure, Pleurx drain insertion.  RN states that she should be able to have an  Ensure after the procedure.    Nutrition Focused Physical Exam conducted.  Findings include moderate fat depletion, moderate muscle depletion, and no edema.  Patient reports that a usual body weight was 155# and most recently weighed 139# at her doctors appointment.  Per chart review, patient has lost 14# / 10% of body weight x 4 months, significant for the time frame.    Patient is not currently meeting kcal and protein needs.  Will order Chocolate Ensure for this afternoon post procedure.  Medications reviewed: folic acid. Labs reviewed.    Diet Order:    NPO, except ice chips  Skin:  Reviewed, no issues  Last BM:  3/29  Height:   Ht Readings from Last 1 Encounters:  03/15/16 '5\' 9"'$  (1.753 m)    Weight:   Wt Readings from Last 1 Encounters:  03/15/16 130 lb 1.1 oz (59 kg)    Ideal Body Weight:  65.9 kg  BMI:  Body mass index is 19.2 kg/(m^2).  Estimated Nutritional Needs:   Kcal:  1750-1950 (30-33 kcal/kg)  Protein:  80-90 grams  Fluid:  1.8-2 L/day  EDUCATION NEEDS:   No education needs identified at this time  Veronda Prude, Dietetic Intern Pager: [phone]

## 2016-03-17 DIAGNOSIS — E440 Moderate protein-calorie malnutrition: Secondary | ICD-10-CM

## 2016-03-17 MED ORDER — MORPHINE SULFATE (CONCENTRATE) 10 MG /0.5 ML PO SOLN: 10.0000 mg | ORAL | Status: AC | PRN

## 2016-03-17 MED ORDER — MORPHINE SULFATE ER 15 MG PO TBCR: 15.0000 mg | EXTENDED_RELEASE_TABLET | Freq: Two times a day (BID) | ORAL | Status: AC

## 2016-03-17 NOTE — Progress Notes (Signed)
03/17/16 1700 Pt transported via EMS to [facility].

## 2016-03-17 NOTE — Progress Notes (Signed)
Patient ID: Deborah [name], female   DOB: Sep 17, 1946, 70 y.o.   MRN: [id number]    Referring Physician(s): Dr. Londell Moh  Supervising Physician: Sandi Mariscal  Chief Complaint: Recurrent left pleural effusion  Subjective: Patient doing well this am.  Slightly sore at insertion site.  Allergies: Review of patient's allergies indicates no known allergies.  Medications: Prior to Admission medications   Medication Sig Start Date End Date Taking? Authorizing Provider  albuterol (PROVENTIL HFA;VENTOLIN HFA) 108 (90 BASE) MCG/ACT inhaler Inhale 2 puffs into the lungs every 6 (six) hours as needed for wheezing or shortness of breath. 07/28/15  Yes Biagio Borg, MD  feeding supplement, ENSURE ENLIVE, (ENSURE ENLIVE) LIQD Take 237 mLs by mouth 2 (two) times daily between meals. 02/16/16  Yes Albertine Patricia, MD  folic acid (FOLVITE) 1 MG tablet TAKE 1 TABLET (1 MG TOTAL) BY MOUTH DAILY. 03/03/16  Yes Curt Bears, MD  guaiFENesin (MUCINEX) 600 MG 12 hr tablet Take 2 tablets (1,200 mg total) by mouth 2 (two) times daily. 02/16/16  Yes Albertine Patricia, MD  HYDROcodone-acetaminophen (NORCO/VICODIN) 5-325 MG tablet May take 1/2 tab up to max of 2 tabs PO Q 6 hours break thru pain. Patient taking differently: Take 0.5 tablets by mouth every 6 (six) hours as needed for moderate pain.  02/29/16  Yes Susanne Borders, NP  ipratropium-albuterol (DUONEB) 0.5-2.5 (3) MG/3ML SOLN Take 3 mLs by nebulization 3 (three) times daily. 02/16/16  Yes Dawood S Elgergawy, MD  levothyroxine (SYNTHROID, LEVOTHROID) 75 MCG tablet TAKE 1 TABLET BY MOUTH DAILY. 08/06/14  Yes Biagio Borg, MD  LORazepam (ATIVAN) 0.5 MG tablet Take one every eight hours as needed for anxiety.  Prefers one at bedtime. Patient taking differently: Take 0.5 mg by mouth at bedtime as needed for anxiety. Take one every eight hours as needed for anxiety.  Prefers one at bedtime. 02/21/16  Yes Curt Bears, MD  mirtazapine (REMERON) 15 MG tablet  Take 15 mg by mouth every evening. 03/07/16  Yes Historical Provider, MD  ondansetron (ZOFRAN) 8 MG tablet Take 1 tablet (8 mg total) by mouth every 8 (eight) hours as needed for nausea or vomiting. 11/02/15  Yes Curt Bears, MD  polyethylene glycol [facility] / GLYCOLAX) packet Take 17 g by mouth daily as needed. Patient taking differently: Take 17 g by mouth daily as needed for mild constipation.  02/16/16  Yes Albertine Patricia, MD  prochlorperazine (COMPAZINE) 10 MG tablet Take 1 tablet (10 mg total) by mouth every 6 (six) hours as needed for nausea or vomiting. 09/10/15  Yes Curt Bears, MD  UNABLE TO FIND Wheelchair for assistance with ambulation 02/24/16  Yes Curt Bears, MD  dexamethasone (DECADRON) 4 MG tablet 4 mg po bid the day before, day of and day after chemo Patient taking differently: Take 4 mg by mouth 2 (two) times daily. '4mg'$  twice daily  day before, day of and day after chemo 09/10/15   Curt Bears, MD  HYDROcodone-homatropine [facility]) 5-1.5 MG/5ML syrup Take 5 mLs by mouth every 6 (six) hours as needed for cough. 09/27/15   Curt Bears, MD  levofloxacin (LEVAQUIN) 500 MG tablet Take 1 tablet (500 mg total) by mouth daily. Patient not taking: Reported on 03/15/2016 02/29/16   Susanne Borders, NP  morphine (MS CONTIN) 15 MG 12 hr tablet Take 1 tablet (15 mg total) by mouth every 12 (twelve) hours. 03/17/16   Charlynne Cousins, MD  Morphine Sulfate (MORPHINE CONCENTRATE) 10 mg /  0.5 ml concentrated solution Take 0.5 mLs (10 mg total) by mouth every 2 (two) hours as needed for severe pain. 03/17/16   Charlynne Cousins, MD  senna (SENOKOT) 8.6 MG TABS tablet Take 1 tablet (8.6 mg total) by mouth daily. 02/16/16   Albertine Patricia, MD    Vital Signs: BP 92/63 mmHg  Pulse 130  Temp(Src) 97.7 F (36.5 C) (Axillary)  Resp 33  Ht '5\' 9"'$  (1.753 m)  Wt 130 lb 1.1 oz (59 kg)  BMI 19.20 kg/m2  SpO2 96%  LMP 10/11/1979  Physical Exam: Chest: pleurX catheter in place and  covered.  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 EXAM: CT ANGIOGRAPHY CHEST WITH CONTRAST TECHNIQUE: Multidetector CT imaging of the chest was performed using the standard protocol during bolus administration of intravenous contrast. Multiplanar CT image reconstructions and MIPs were obtained to evaluate the vascular anatomy. CONTRAST:  100 cc Isovue 370 IV. COMPARISON:  Chest radiograph from earlier today. 02/10/2016 chest CT. FINDINGS: Mediastinum/Nodes: The study is high quality for the evaluation of pulmonary embolism. There are no filling defects in the central, lobar, segmental or subsegmental pulmonary artery branches to suggest acute pulmonary embolism. Great vessels are normal in course and caliber. Normal heart size. Stable mild pericardial fluid/ thickening. Normal visualized thyroid. Normal esophagus. Stable mildly enlarged bilateral supraclavicular nodes, largest 1.0 cm bilaterally. No axillary adenopathy. Stable mildly enlarged 1.1 cm subcarinal node (series 4/ image 46). Bilateral confluent hilar adenopathy appears stable on the left and increased on the right. Lungs/Pleura: No pneumothorax. Stable small right pleural effusion with diffuse right pleural thickening and enhancement. Moderate left pleural effusion is increased, with scattered areas of left pleural thickening and enhancement. There is masslike consolidation with air bronchograms involving most of the right lung, which has significantly increased since 02/10/2016, for example measuring 10.7 x 8.8 cm in the right upper lobe (series 7/image 39), compared to 5.2 x 3.5 cm on 02/10/2016 at a comparable level, markedly increased, favor progression of tumor. There is consolidation and volume loss in the  basilar left lower lobe, probably a combination of atelectasis and tumor. There is severe interlobular septal thickening and peribronchovascular, subpleural nodularity throughout both lungs, which appears worsened, in keeping with lymphangitic carcinomatosis. Upper abdomen: Limited visualized portions of the upper abdomen appear unremarkable. Musculoskeletal: There is a diffuse moth-eaten appearance of the entire thoracic skeleton, likely representing infiltrative lytic osseous metastases, with areas of cortical erosion as best seen at the posterior manubrium (series 603/ image 69), which appears slightly worsened since 02/10/2016. Moderate degenerative changes in the thoracic spine. Review of the MIP images confirms the above findings. IMPRESSION: 1. No pulmonary embolism. 2. Severe lymphangitic carcinomatosis throughout both lungs, worsened. Masslike consolidation involving most of the right lung, markedly increased since 02/10/2016, which likely predominantly represents significant tumor progression. A component of postobstructive pneumonia cannot be excluded. 3. Malignant bilateral pleural effusions, stable and small on the right and moderate and increased on the left. 4. Diffuse moth-eaten appearance of the thoracic skeleton, favor infiltrative lytic osseous metastases, slightly worsened. 5. Stable mild pericardial fluid/thickening. 6. Bilateral supraclavicular, subcarinal and bilateral hilar lymphadenopathy, increased at the right hilum. Electronically Signed   By: Ilona Sorrel M.D.   On: 03/15/2016 21:21   Ir Guided Pershing Proud  Catheter Placement  03/16/2016  INDICATION: 70 year old female with malignant left-sided pleural effusion and significant dyspnea. She presents for placement of a tunneled pleural drainage catheter for palliation. EXAM: ABSCESS DRAINAGE MEDICATIONS: The patient is currently admitted to the hospital and receiving intravenous antibiotics. The antibiotics were administered within an  appropriate time frame prior to the initiation of the procedure. ANESTHESIA/SEDATION: Fentanyl 50 mcg IV; Versed 2.5 mg IV Moderate Sedation Time:  12 The patient was continuously monitored during the procedure by the interventional radiology nurse under my direct supervision. COMPLICATIONS: None immediate. PROCEDURE: Informed written consent was obtained from the patient after a thorough discussion of the procedural risks, benefits and alternatives. All questions were addressed. Maximal Sterile Barrier Technique was utilized including caps, mask, sterile gowns, sterile gloves, sterile drape, hand hygiene and skin antiseptic. A timeout was performed prior to the initiation of the procedure. The left chest was interrogated with ultrasound. There is a large pleural effusion. A suitable skin entry site was selected and marked. Local anesthesia was attained by infiltration with 1% lidocaine. A small dermatotomy was made. A sheath needle was then advanced over rib into the pleural space. An Amplatz wire was advanced into the pleural space. A suitable skin exit site 5 cm medial and slightly cephalad was selected. Local anesthesia was again attained by infiltration with 1% lidocaine. A small dermatotomy was made. The PleurX catheter was then tunneled retrograde from the skin exit site to the dermatotomy overlying the pleural access site. A tapered peel-away sheath was advanced over the Amplatz wire and into the pleural space. The PleurX catheter was advanced through the peel-away sheath and the peel-away sheath was peeled and discarded. The catheter was connected to low wall suction and drainage was performed. While drainage was on going, this dermatotomy overlying the pleural access site was closed with a subdermal Vicryl suture in the epidermis sealed with Dermabond. The catheter was then secured at the skin exit site with an 0 Prolene suture. Sterile bandages were applied. A total of 1.5 L bloody pleural fluid was  successfully aspirated before the patient became symptomatic. IMPRESSION: 1. Successful placement of a left-sided tunneled PleurX catheter. 2. Drainage of 1.5 L bloody pleural fluid. Signed, Criselda Peaches, MD [facility] Electronically Signed   By: Jacqulynn Cadet M.D.   On: 03/16/2016 16:16   Dg Chest Port 1 View  03/15/2016  CLINICAL DATA:  Shortness of breath EXAM: PORTABLE CHEST 1 VIEW COMPARISON:  02/29/2016 FINDINGS: Worsening airspace opacities throughout much of the right lung and left base concerning for worsening pneumonia. Enlarging left pleural effusion, now moderate. Small right pleural effusion. Heart is normal size. IMPRESSION: Worsening bilateral airspace disease, or concerning for worsening pneumonia. Enlarging left effusion, now moderate. Electronically Signed   By: Rolm Baptise M.D.   On: 03/15/2016 15:35    Labs:  CBC:  Recent Labs  02/24/16 0923 02/29/16 1318 03/15/16 1539 03/16/16 0305  WBC 6.7 10.3 8.0 10.1  HGB 11.0* 11.6 10.6* 9.8*  HCT 33.3* 35.0 31.8* 30.1*  PLT 263 249 300 288    COAGS:  Recent Labs  09/15/15 1210 03/15/16 1852  INR 1.08 1.14  APTT 29  --     BMP:  Recent Labs  02/15/16 0419 02/16/16 0430 02/24/16 0923 02/29/16 1319 03/15/16 1539 03/16/16 0305  NA 141 140 140 136 141 141  K 4.1 3.8 4.1 4.4 4.1 4.3  CL 108 102  --   --  104 105  CO2 '27 30 24 26 28 28  '$ GLUCOSE 98 98 89 107 140* 118*  BUN <5* 7 10.2 12.'5 12 10  '$ CALCIUM 8.6* 9.0 9.3 9.5 8.6* 8.5*  CREATININE 0.93 0.83 0.7 0.8 0.66 0.70  GFRNONAA >60 >60  --   --  >60 >60  GFRAA >60 >60  --   --  >60 >60    LIVER FUNCTION TESTS:  Recent Labs  02/16/16 0430 02/24/16 0923 02/29/16 1319 03/15/16 1539  BILITOT 0.6 0.34 1.15 0.5  AST 44* 49* 49* 53*  ALT '22 10 11 '$ 10*  ALKPHOS 109 112 107 81  PROT 5.0* 5.7* 6.0* 5.1*  ALBUMIN 2.5* 2.6* 2.4* 2.4*    Assessment and Plan: 1. reucurrent left pleural  effusion, s/p PleurX catheter placement, 3-30 Laurence Ferrari -patient doing well today.  Cont pleurX cathether -call with any questions.  Electronically Signed: Henreitta Cea 03/17/2016, 9:25 AM   I spent a total of 15 Minutes at the the patient's bedside AND on the patient's hospital floor or unit, greater than 50% of which was counseling/coordinating care for recurrent left pleural effusion

## 2016-03-17 NOTE — Progress Notes (Signed)
CHART NOTE The patient is seen this morning. She is feeling a little bit better after drainage of 1.5 L of left pleural effusion. Palliative care team member at the bedside. Unfortunately the recent CT scan of the chest showed significant evidence for disease progression. I discussed with the patient yesterday and today her treatment options and is strongly recommended for her to consider palliative care and hospice at this point. The patient is in agreement with the current plan. She is expected to be discharged either home or to hospice facility today. I encouraged the patient to call my office if she has any concerning symptoms or questions in the interval. The patient was very appreciative of the care she received.

## 2016-03-17 NOTE — Progress Notes (Signed)
PCCM Interval Note  Pt did get some relief from Pleurex placement.  Discussed case with Palliative Care this am, plan is a transition to full comfort based approach. Would recommend draining pleurex prn to enhance her comfort. Likewise, would only use BiPAP if it eases her WOB, makes her feel better. Otherwise avoid.   Please call if we can assist you   Baltazar Apo, MD, PhD 03/17/2016, 10:13 AM [facility] [phone] or if no answer [phone]

## 2016-03-17 NOTE — Progress Notes (Signed)
03/17/16  1630  Report given to [facility] [place].

## 2016-03-17 NOTE — Progress Notes (Signed)
03/17/16  1805  Pt left Rx. Called [facility] and the on-call nurse states that I could fax the Rx to the patients pharmacy or have family to pick up Rx, but Hospice does not need the Rx. Spoke with patients husband and he will come by tomorrow to pick up Rx.

## 2016-03-17 NOTE — Care Management Note (Signed)
Case Management Note  Patient Details  Name: JLYNN LY MRN: [id number] Date of Birth: 1946-10-05  Subjective/Objective: CSW following for residential hospice.                   Action/Plan:d/c residential hospice.   Expected Discharge Date:   (unknown)               Expected Discharge Plan:  [place]  In-House Referral:     Discharge planning Services  CM Consult  Post Acute Care Choice:    Choice offered to:     DME Arranged:    DME Agency:     HH Arranged:    [place] Agency:     Status of Service:  Completed, signed off  Medicare Important Message Given:    Date Medicare IM Given:    Medicare IM give by:    Date Additional Medicare IM Given:    Additional Medicare Important Message give by:     If discussed at [place] of Stay Meetings, dates discussed:    Additional Comments:  Dessa Phi, RN 03/17/2016, 12:40 PM

## 2016-03-17 NOTE — Clinical Social Work Note (Signed)
Clinical Social Work Assessment  Patient Details  Name: Deborah [name] MRN: [id number] Date of Birth: 28-Nov-1946  Date of referral:  03/17/16               Reason for consult:  Discharge Planning, End of Life/Hospice                Permission sought to share information with:  Chartered certified accountant granted to share information::  Yes, Verbal Permission Granted  Name::        Agency::     Relationship::     Contact Information:     Housing/Transportation Living arrangements for the past 2 months:  Single Family Home Source of Information:  Patient Patient Interpreter Needed:  None Criminal Activity/Legal Involvement Pertinent to Current Situation/Hospitalization:  No - Comment as needed Significant Relationships:  Adult Children, Spouse Lives with:  Spouse Do you feel safe going back to the place where you live?  No (Residential Hospice needed.) Need for family participation in patient care:  Yes (Comment)  Care giving concerns:  Pt's care cannot be managed at home following hospital d/c.   Social Worker assessment / plan:  Pt hospitalized on 03/15/16 with acute respiratory failure with hypoxia due to pleural effusion. CSW consulted to assist with residential hospice home placement. CSW met with pt/ step daughter to confirm hospice home choice. Pt reports that she is interested in [facility] [place]. CSW has contacted [facility] and a referral has been initiated. CSW is expecting to hear back from Hospice this afternoon with a decision. CSW will continue to follow to assist with d/c planning.  Employment status:  Retired Nurse, adult PT Recommendations:  Not assessed at this time Information / Referral to community resources:  Other (Comment Required) (Residential Hospice Home placement)  Patient/Family's Response to care:  Pt / family are in agreement with residential hospice home placement.  Patient/Family's  Understanding of and Emotional Response to Diagnosis, Current Treatment, and Prognosis:  Pt / family have met with Palliative Care team . Pt/ family are aware of pt's medical status and prognosis. They are hopeful that [facility] will be able to assist with placement.  Emotional Assessment Appearance:  Appears stated age Attitude/Demeanor/Rapport:  Other (cooperative) Affect (typically observed):  Calm, Appropriate Orientation:  Oriented to Self, Oriented to Place, Oriented to  Time, Oriented to Situation Alcohol / Substance use:  Not Applicable Psych involvement (Current and /or in the community):  No (Comment)  Discharge Needs  Concerns to be addressed:  Discharge Planning Concerns Readmission within the last 30 days:  No Current discharge risk:  None Barriers to Discharge:  No Barriers Identified   Luretha Rued, [place] 03/17/2016, 12:05 PM

## 2016-03-17 NOTE — Discharge Summary (Signed)
Physician Discharge Summary  Deborah [name] ZDG:[id number] DOB: [birth year] DOA: 03/15/2016  PCP: [name], MD  Admit date: 03/15/2016 Discharge date: 03/17/2016  Time spent: 35 minutes  Recommendations for Outpatient Follow-up:  1. She will go to hospice facility.   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  Hospital Course:  Acute respiratory failure with hypoxia and malignant pleural effusion with progression of non-small cell adenocarcinoma of the right lung, bilateral pleural effusion which the left one is worse. CT of the chest was obtained that showed worsening lymphangitic carcinomatosis bilaterally with multiple enlarging lymph nodes and lytic lesions to the spine. She was started empirically on IV vancomycin and cefepime due to her fevers, oncology was consulted recommended no further treatments and that her options were limited. He also recommended to consult palliative care.  IR was consulted for thoracentesis of left pleural effusion, which will 1.5 L. She relates this relieve her dyspnea significantly. She discuss with palliative care and her husband hates she would like to move towards comfort care. She will like to continue medications to make her  comfortable.  Hypoalbuminemia: Continue comfort feeds.  Cancer associated pain. Continue current narcotic regimen.   Procedures:  CT chest  Consultations:  Oncology  PCCM  IR  Discharge Exam: Filed Vitals:   03/17/16 0500 03/17/16 0600  BP:  92/63  Pulse:    Temp:    Resp: 15 33    General: A&O x3 Cardiovascular: RRR Respiratory: good air movement CTA B/L  Discharge Instructions   Discharge Instructions    Diet - low sodium heart healthy    Complete by:  As directed      Increase activity slowly    Complete by:  As directed           Current Discharge Medication List    START taking these medications   Details  Morphine Sulfate (MORPHINE CONCENTRATE) 10 mg / 0.5 ml concentrated solution Take 0.5 mLs (10 mg total) by mouth every 2 (two) hours as needed for severe pain. Qty: 20 mL, Refills: 0      CONTINUE these medications which have CHANGED   Details  morphine (MS CONTIN) 15 MG 12 hr tablet Take 1 tablet (15 mg total) by mouth every 12 (twelve) hours. Qty: 28 tablet, Refills: 0   Associated Diagnoses: Non-small cell carcinoma of right lung ([place])      CONTINUE these medications which have NOT CHANGED   Details  albuterol (PROVENTIL HFA;VENTOLIN HFA) 108 (90 BASE) MCG/ACT inhaler Inhale 2 puffs into the lungs every 6 (six) hours as needed for wheezing or shortness of breath. Qty: 1 Inhaler, Refills: 1    LORazepam (ATIVAN) 0.5 MG tablet Take one every eight hours as needed for anxiety.  Prefers one at bedtime. Qty: 30 tablet, Refills: 0  Associated Diagnoses: Non-small cell carcinoma of lung, stage 4, right ([place]); Chemotherapy-induced nausea      STOP taking these medications     feeding supplement, ENSURE ENLIVE, (ENSURE ENLIVE) LIQD      folic acid (FOLVITE) 1 MG tablet      guaiFENesin (MUCINEX) 600 MG 12 hr tablet      HYDROcodone-acetaminophen (NORCO/VICODIN) 5-325 MG tablet      ipratropium-albuterol (DUONEB) 0.5-2.5 (3) MG/3ML SOLN       levothyroxine (SYNTHROID, LEVOTHROID) 75 MCG tablet      mirtazapine (REMERON) 15 MG tablet      ondansetron (ZOFRAN) 8 MG tablet      polyethylene glycol (MIRALAX / GLYCOLAX) packet      prochlorperazine (COMPAZINE) 10 MG tablet      UNABLE TO FIND      dexamethasone (DECADRON) 4 MG tablet      HYDROcodone-homatropine (HYCODAN) 5-1.5 MG/5ML syrup      levofloxacin (LEVAQUIN) 500 MG tablet      senna (SENOKOT) 8.6 MG TABS tablet        No Known Allergies    The results of significant diagnostics from this hospitalization (including imaging, microbiology, ancillary and laboratory) are listed below for reference.    Significant Diagnostic Studies: Dg Chest 2 View  02/29/2016  CLINICAL DATA:  Left-sided chest pain. History of non-small cell lung cancer. EXAM: CHEST  2 VIEW COMPARISON:  February 15, 2016. FINDINGS: Stable cardiomediastinal silhouette. There is continued presence of large airspace opacity seen in right midlung and right basilar region concerning for pneumonia or inflammation. Multiple small pulmonary nodules are again noted. Mild right pleural effusion is noted. Left basilar opacity is also noted consistent with pneumonia or atelectasis with associated pleural effusion. No pneumothorax is noted. Bony thorax is unremarkable. IMPRESSION: Stable bilateral lung opacities are noted concerning for pneumonia or atelectasis. Stable bilateral pulmonary nodules are noted concerning for metastatic disease. Stable bilateral pleural effusions are noted. Electronically Signed   By: Marijo Conception, M.D.   On: 02/29/2016 16:02   Dg Ribs Unilateral Left  02/29/2016  CLINICAL DATA:  Left-sided chest pain, no known injury, initial encounter EXAM: LEFT RIBS - 2 VIEW COMPARISON:  02/15/2016 FINDINGS: Persistent left basilar infiltrate is seen. No acute fracture is noted. No pneumothorax is seen. IMPRESSION: No acute rib abnormality noted. Persistent left basilar infiltrate is seen.  Electronically Signed   By: Inez Catalina M.D.   On: 02/29/2016 16:00   Ct Angio Chest Pe W/cm &/or Wo Cm  03/15/2016  CLINICAL DATA:  Stage IV right lung adenocarcinoma on chemotherapy, recently treated for healthcare associated pneumonia, with worsening dyspnea, fever and productive cough. EXAM: CT ANGIOGRAPHY CHEST WITH CONTRAST TECHNIQUE: Multidetector CT imaging of the chest was performed using the standard protocol during bolus administration of intravenous contrast. Multiplanar CT image reconstructions and MIPs were obtained to evaluate the vascular anatomy. CONTRAST:  100 cc Isovue 370 IV. COMPARISON:  Chest radiograph from earlier today. 02/10/2016 chest CT. FINDINGS: Mediastinum/Nodes: The study is high quality for the evaluation of pulmonary embolism. There are no filling defects in the central, lobar, segmental or subsegmental pulmonary artery branches to suggest acute pulmonary embolism. Great vessels are normal in course and caliber. Normal heart size. Stable mild pericardial fluid/ thickening. Normal visualized thyroid. Normal esophagus. Stable mildly enlarged bilateral supraclavicular nodes, largest 1.0 cm bilaterally. No axillary adenopathy. Stable mildly enlarged 1.1 cm subcarinal node (series 4/ image 46). Bilateral confluent hilar adenopathy appears stable on the left  and increased on the right. Lungs/Pleura: No pneumothorax. Stable small right pleural effusion with diffuse right pleural thickening and enhancement. Moderate left pleural effusion is increased, with scattered areas of left pleural thickening and enhancement. There is masslike consolidation with air bronchograms involving most of the right lung, which has significantly increased since 02/10/2016, for example measuring 10.7 x 8.8 cm in the right upper lobe (series 7/image 39), compared to 5.2 x 3.5 cm on 02/10/2016 at a comparable level, markedly increased, favor progression of tumor. There is consolidation and volume loss in the  basilar left lower lobe, probably a combination of atelectasis and tumor. There is severe interlobular septal thickening and peribronchovascular, subpleural nodularity throughout both lungs, which appears worsened, in keeping with lymphangitic carcinomatosis. Upper abdomen: Limited visualized portions of the upper abdomen appear unremarkable. Musculoskeletal: There is a diffuse moth-eaten appearance of the entire thoracic skeleton, likely representing infiltrative lytic osseous metastases, with areas of cortical erosion as best seen at the posterior manubrium (series 603/ image 69), which appears slightly worsened since 02/10/2016. Moderate degenerative changes in the thoracic spine. Review of the MIP images confirms the above findings. IMPRESSION: 1. No pulmonary embolism. 2. Severe lymphangitic carcinomatosis throughout both lungs, worsened. Masslike consolidation involving most of the right lung, markedly increased since 02/10/2016, which likely predominantly represents significant tumor progression. A component of postobstructive pneumonia cannot be excluded. 3. Malignant bilateral pleural effusions, stable and small on the right and moderate and increased on the left. 4. Diffuse moth-eaten appearance of the thoracic skeleton, favor infiltrative lytic osseous metastases, slightly worsened. 5. Stable mild pericardial fluid/thickening. 6. Bilateral supraclavicular, subcarinal and bilateral hilar lymphadenopathy, increased at the right hilum. Electronically Signed   By: Ilona Sorrel M.D.   On: 03/15/2016 21:21   Ir Guided Niel Hummer W Catheter Placement  03/16/2016  INDICATION: 70 year old female with malignant left-sided pleural effusion and significant dyspnea. She presents for placement of a tunneled pleural drainage catheter for palliation. EXAM: ABSCESS DRAINAGE MEDICATIONS: The patient is currently admitted to the hospital and receiving intravenous antibiotics. The antibiotics were administered within an  appropriate time frame prior to the initiation of the procedure. ANESTHESIA/SEDATION: Fentanyl 50 mcg IV; Versed 2.5 mg IV Moderate Sedation Time:  12 The patient was continuously monitored during the procedure by the interventional radiology nurse under my direct supervision. COMPLICATIONS: None immediate. PROCEDURE: Informed written consent was obtained from the patient after a thorough discussion of the procedural risks, benefits and alternatives. All questions were addressed. Maximal Sterile Barrier Technique was utilized including caps, mask, sterile gowns, sterile gloves, sterile drape, hand hygiene and skin antiseptic. A timeout was performed prior to the initiation of the procedure. The left chest was interrogated with ultrasound. There is a large pleural effusion. A suitable skin entry site was selected and marked. Local anesthesia was attained by infiltration with 1% lidocaine. A small dermatotomy was made. A sheath needle was then advanced over rib into the pleural space. An Amplatz wire was advanced into the pleural space. A suitable skin exit site 5 cm medial and slightly cephalad was selected. Local anesthesia was again attained by infiltration with 1% lidocaine. A small dermatotomy was made. The PleurX catheter was then tunneled retrograde from the skin exit site to the dermatotomy overlying the pleural access site. A tapered peel-away sheath was advanced over the Amplatz wire and into the pleural space. The PleurX catheter was advanced through the peel-away sheath and the peel-away sheath was peeled and discarded. The catheter was connected to low wall  suction and drainage was performed. While drainage was on going, this dermatotomy overlying the pleural access site was closed with a subdermal Vicryl suture in the epidermis sealed with Dermabond. The catheter was then secured at the skin exit site with an 0 Prolene suture. Sterile bandages were applied. A total of 1.5 L bloody pleural fluid was  successfully aspirated before the patient became symptomatic. IMPRESSION: 1. Successful placement of a left-sided tunneled PleurX catheter. 2. Drainage of 1.5 L bloody pleural fluid. Signed, Criselda Peaches, MD [facility] Electronically Signed   By: Jacqulynn Cadet M.D.   On: 03/16/2016 16:16   Dg Chest Port 1 View  03/15/2016  CLINICAL DATA:  Shortness of breath EXAM: PORTABLE CHEST 1 VIEW COMPARISON:  02/29/2016 FINDINGS: Worsening airspace opacities throughout much of the right lung and left base concerning for worsening pneumonia. Enlarging left pleural effusion, now moderate. Small right pleural effusion. Heart is normal size. IMPRESSION: Worsening bilateral airspace disease, or concerning for worsening pneumonia. Enlarging left effusion, now moderate. Electronically Signed   By: Rolm Baptise M.D.   On: 03/15/2016 15:35    Microbiology: Recent Results (from the past 240 hour(s))  Blood culture (routine x 2)     Status: None (Preliminary result)   Collection Time: 03/15/16  3:39 PM  Result Value Ref Range Status   Specimen Description BLOOD BACTERIAL CASTS  Final   Special Requests BOTTLES DRAWN AEROBIC AND ANAEROBIC 5ML  Final   Culture   Final    NO GROWTH < 24 HOURS Performed at [facility]    Report Status PENDING  Incomplete  MRSA PCR Screening     Status: None   Collection Time: 03/15/16  6:35 PM  Result Value Ref Range Status   MRSA by PCR NEGATIVE NEGATIVE Final    Comment:        The GeneXpert MRSA Assay (FDA approved for NASAL specimens only), is one component of a comprehensive MRSA colonization surveillance program. It is not intended to diagnose MRSA infection nor to guide or monitor treatment for MRSA infections.   Gram stain     Status: None   Collection Time: 03/16/16  3:57 PM  Result Value Ref Range Status   Specimen Description FLUID PLEURAL  Final   Special Requests NONE  Final    Gram Stain   Final    WBC PRESENT, PREDOMINANTLY MONONUCLEAR NO ORGANISMS SEEN CYTOSPIN Performed at [facility]    Report Status 03/16/2016 FINAL  Final     Labs: Basic Metabolic Panel:  Recent Labs Lab 03/15/16 1539 03/16/16 0305  NA 141 141  K 4.1 4.3  CL 104 105  CO2 28 28  GLUCOSE 140* 118*  BUN 12 10  CREATININE 0.66 0.70  CALCIUM 8.6* 8.5*   Liver Function Tests:  Recent Labs Lab 03/15/16 1539  AST 53*  ALT 10*  ALKPHOS 81  BILITOT 0.5  PROT 5.1*  ALBUMIN 2.4*   No results for input(s): LIPASE, AMYLASE in the last 168 hours. No results for input(s): AMMONIA in the last 168 hours. CBC:  Recent Labs Lab 03/15/16 1539 03/16/16 0305  WBC 8.0 10.1  NEUTROABS 5.4  --   HGB 10.6* 9.8*  HCT 31.8* 30.1*  MCV 93.8 95.3  PLT 300 288   Cardiac Enzymes: No results for input(s): CKTOTAL, CKMB, CKMBINDEX, TROPONINI in the last 168 hours. BNP: BNP (last 3 results)  Recent Labs  02/11/16 1638  BNP 15.3  ProBNP (last 3 results) No results for input(s): PROBNP in the last 8760 hours.  CBG: No results for input(s): GLUCAP in the last 168 hours.     Signed:  Charlynne Cousins MD.  [facility] 03/17/2016, 7:48 AM

## 2016-03-17 NOTE — Progress Notes (Signed)
   03/17/16 1400  Clinical Encounter Type  Visited With Patient and family together  Visit Type Follow-up;Psychological support;Spiritual support;Critical Care  Referral From Patient  Consult/Referral To Chaplain  Spiritual Encounters  Spiritual Needs Emotional;Other (Comment) (Pastoral Support/Conversation)  Stress Factors  Patient Stress Factors Health changes;Other (Comment) (End of Life Issues)  Family Stress Factors Health changes;Loss   I followed up with the patient from a previous visit the day before. The patient was awake and was visibly breathing better than the day before. Her husband was present in the room and was appropriately tearful.  The patient stated that she had a good visit from her youngest daughter, but that it wore her out. She was needing rest, but wanted to visit with me, because she might have been moving to Hospice later in the day.  The patient stated that she was struggling with the decision of what to do with her body once she passes. We explored her spirituality, which she stated was giving her hope and comfort. She asked if being cremated was wrong spiritually.  She takes comfort in knowing that she will go to [place] once she passes.  The patient feels that her oldest daughter, whom I spoke with the day before, would be there to support her husband once she passes. This brings her comfort.  The patient's husband is dealing with issues around loss and grief over the patient's condition; but is utilizing his faith for strength.  The patient requested that I come back to visit her before she leaves.  I will follow-up with the patient if she is still hear before I leave.    [place] M.Div.

## 2016-03-17 NOTE — Progress Notes (Signed)
Daily Progress Note   Patient Name: Deborah [name]       Date: 03/17/2016 DOB: 01-06-1946  Age: 70 y.o. MRN#: [id number] Attending Physician: Charlynne Cousins, MD Primary Care Physician: [name], MD Admit Date: 03/15/2016  Reason for Consultation/Follow-up: Establishing goals of care, Inpatient hospice referral, Non pain symptom management, Pain control and Psychosocial/spiritual support  Subjective:  - patient "actually slept last night"  -continued conversation with patient and her husband regarding [place] and anticipatory care needs.  Both understand the limited prognosis and focus of care is full comfort.  No life prolonging interventions, no BiPap  - hope [facility] for EOL care, her mother died there   Length of Stay: 2 days  Current Medications: Scheduled Meds:  . antiseptic oral rinse  7 mL Mouth Rinse q12n4p  . ceFEPime (MAXIPIME) IV  1 g Intravenous Q12H  . chlorhexidine  15 mL Mouth Rinse BID  . feeding supplement (ENSURE ENLIVE)  237 mL Oral BID BM  . ipratropium-albuterol  3 mL Nebulization TID  . levothyroxine  75 mcg Oral QAC breakfast  . mirtazapine  15 mg Oral QPM  . morphine  15 mg Oral Q12H  . polyethylene glycol  17 g Oral Daily  . sodium chloride flush  3 mL Intravenous Q12H  . sodium chloride flush  3 mL Intravenous Q12H  . vancomycin  750 mg Intravenous Q12H    Continuous Infusions:    PRN Meds: sodium chloride, acetaminophen **OR** acetaminophen, LORazepam, morphine injection, ondansetron **OR** ondansetron (ZOFRAN) IV, prochlorperazine, senna, sodium chloride flush, sorbitol  Physical Exam: Physical Exam  Constitutional: She appears lethargic. She appears ill.  Cardiovascular: Tachycardia present.     Pulmonary/Chest: Accessory muscle usage present. Tachypnea noted. She has decreased breath sounds.  Neurological: She appears lethargic.  Skin: Skin is warm and dry.                Vital Signs: BP 92/63 mmHg  Pulse 130  Temp(Src) 97.7 F (36.5 C) (Axillary)  Resp 33  Ht '5\' 9"'$  (1.753 m)  Wt 59 kg (130 lb 1.1 oz)  BMI 19.20 kg/m2  SpO2 96%  LMP 10/11/1979 SpO2: SpO2: 96 % O2 Device: O2 Device: Nasal Cannula O2 Flow Rate: O2 Flow Rate (L/min): 6 L/min  Intake/output  summary:  Intake/Output Summary (Last 24 hours) at 03/17/16 0938 Last data filed at 03/17/16 0800  Gross per 24 hour  Intake    400 ml  Output    375 ml  Net     25 ml   LBM: Last BM Date: 03/16/16 Baseline Weight: Weight: 59 kg (130 lb 1.1 oz) Most recent weight: Weight: 59 kg (130 lb 1.1 oz)       Palliative Assessment/Data:   Additional Data Reviewed: CBC    Component Value Date/Time   WBC 10.1 03/16/2016 0305   WBC 10.3 02/29/2016 1318   RBC 3.16* 03/16/2016 0305   RBC 3.76 02/29/2016 1318   HGB 9.8* 03/16/2016 0305   HGB 11.6 02/29/2016 1318   HCT 30.1* 03/16/2016 0305   HCT 35.0 02/29/2016 1318   PLT 288 03/16/2016 0305   PLT 249 02/29/2016 1318   MCV 95.3 03/16/2016 0305   MCV 93.1 02/29/2016 1318   MCH 31.0 03/16/2016 0305   MCH 30.8 02/29/2016 1318   MCHC 32.6 03/16/2016 0305   MCHC 33.0 02/29/2016 1318   RDW 16.5* 03/16/2016 0305   RDW 16.3* 02/29/2016 1318   LYMPHSABS 1.1 03/15/2016 1539   LYMPHSABS 1.9 02/29/2016 1318   MONOABS 1.4* 03/15/2016 1539   MONOABS 0.5 02/29/2016 1318   EOSABS 0.1 03/15/2016 1539   EOSABS 0.0 02/29/2016 1318   BASOSABS 0.1 03/15/2016 1539   BASOSABS 0.0 02/29/2016 1318    CMP     Component Value Date/Time   NA 141 03/16/2016 0305   NA 136 02/29/2016 1319   K 4.3 03/16/2016 0305   K 4.4 02/29/2016 1319   CL 105 03/16/2016 0305   CO2 28 03/16/2016 0305   CO2 26 02/29/2016 1319   GLUCOSE 118* 03/16/2016 0305   GLUCOSE 107 02/29/2016 1319    BUN 10 03/16/2016 0305   BUN 12.5 02/29/2016 1319   CREATININE 0.70 03/16/2016 0305   CREATININE 0.8 02/29/2016 1319   CALCIUM 8.5* 03/16/2016 0305   CALCIUM 9.5 02/29/2016 1319   PROT 5.1* 03/15/2016 1539   PROT 6.0* 02/29/2016 1319   ALBUMIN 2.4* 03/15/2016 1539   ALBUMIN 2.4* 02/29/2016 1319   AST 53* 03/15/2016 1539   AST 49* 02/29/2016 1319   ALT 10* 03/15/2016 1539   ALT 11 02/29/2016 1319   ALKPHOS 81 03/15/2016 1539   ALKPHOS 107 02/29/2016 1319   BILITOT 0.5 03/15/2016 1539   BILITOT 1.15 02/29/2016 1319   GFRNONAA >60 03/16/2016 0305   GFRAA >60 03/16/2016 0305       Problem List:  Patient Active Problem List   Diagnosis Date Noted  . Malnutrition of moderate degree 03/16/2016  . DNR (do not resuscitate) 03/16/2016  . Dyspnea 03/16/2016  . Agitation 03/16/2016  . Palliative care encounter   . Acute respiratory failure with hypoxia ([place]) 03/15/2016  . Nausea with vomiting 03/02/2016  . Cancer associated pain 03/02/2016  . Pneumonia 03/02/2016  . Insomnia 03/02/2016  . Left sided chest pain 02/11/2016  . History of breast cancer - 20 yrs ago 02/11/2016  . HCAP (healthcare-associated pneumonia) 02/11/2016  . Dehydration 02/10/2016  . Constipation 02/10/2016  . UTI (urinary tract infection) 02/10/2016  . Hypoalbuminemia due to protein-calorie malnutrition ([place]) 02/10/2016  . Encounter for antineoplastic chemotherapy 10/28/2015  . Anxiety 08/30/2015  . 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 Pleural effusion, malignant 08/25/2015  . Atrophic vaginitis   . Hypothyroidism 08/04/2011  . Preventative health care 08/04/2011  . Allergic  rhinitis, cause unspecified 08/04/2011  . Diverticulosis 08/04/2011  . Hyperlipidemia 08/04/2011     Palliative Care Assessment & Plan    1.Code Status:  DNR    Code Status Orders        Start     Ordered   03/16/16 0855  Do not attempt resuscitation (DNR)   Continuous    Question  Answer Comment  Maintain current active treatments Yes   Do not initiate new interventions Yes      03/16/16 0855    Code Status History    Date Active Date Inactive Code Status Order ID Comments User Context   03/15/2016  6:33 PM 03/16/2016  8:55 AM Full Code [id number]  Charlynne Cousins, MD Inpatient   02/11/2016  8:57 PM 02/16/2016  6:49 PM Full Code [id number]  Roney Jaffe, MD Inpatient    Advance Directive Documentation        Most Recent Value   Type of Advance Directive  Living will   Pre-existing out of facility DNR order (yellow form or pink MOST form)     "MOST" Form in Place?         2. Goals of Care/Additional Recommendations:   Limitations on Scope of Treatment: Full Comfort Care  Desire for further Chaplaincy support:no  Psycho-social Needs: Education on Hospice and Grief/Bereavement Support  3. Symptom Management:      1.Dyspnea/Pain: Morphine 2 mg IV every 1 hr prn      2. Anxiety: Ativan 1 mg IV every 4 hrs prn  4. Palliative Prophylaxis:   Aspiration, Bowel Regimen, Frequent Pain Assessment, Oral Care and Turn Reposition  5. Prognosis: < 2 weeks  6. Discharge Planning:  Hospice facility, requesting [place], discussed with [place] plan was discussed with DR Lamonte Sakai and Dr Venetia Constable  Thank you for allowing the Palliative Medicine Team to assist in the care of this patient.   Time In: 0815 Time Out: 0900 Total Time 45 min Prolonged Time Billed  no         Knox Royalty, NP  03/17/2016, 9:38 AM  Please contact Palliative Medicine Team phone at [phone] for questions and concerns.

## 2016-03-18 DIAGNOSIS — C3491 Malignant neoplasm of unspecified part of right bronchus or lung: Secondary | ICD-10-CM | POA: Diagnosis not present

## 2016-03-20 LAB — CHOLESTEROL, BODY FLUID: CHOL FL: 58 mg/dL

## 2016-03-20 LAB — CULTURE, BLOOD (ROUTINE X 2)
CULTURE: NO GROWTH
Culture: NO GROWTH

## 2016-03-21 LAB — CULTURE, BODY FLUID-BOTTLE: CULTURE: NO GROWTH

## 2016-03-21 LAB — CULTURE, BODY FLUID W GRAM STAIN -BOTTLE

## 2016-03-27 ENCOUNTER — Telehealth: Payer: Self-pay | Admitting: Medical Oncology

## 2016-03-27 ENCOUNTER — Telehealth: Payer: Self-pay | Admitting: Internal Medicine

## 2016-03-27 NOTE — Telephone Encounter (Signed)
Patient status changed to deceased

## 2016-03-27 NOTE — Telephone Encounter (Signed)
[facility], [facility] [place]

## 2016-03-27 NOTE — Telephone Encounter (Signed)
Pt deceased all appt cx per pof

## 2016-04-06 ENCOUNTER — Other Ambulatory Visit: Payer: Medicare Other

## 2016-04-06 ENCOUNTER — Ambulatory Visit: Payer: Medicare Other | Admitting: Internal Medicine

## 2016-04-06 ENCOUNTER — Ambulatory Visit: Payer: Medicare Other

## 2016-04-17 DEATH — deceased

## 2016-06-06 ENCOUNTER — Other Ambulatory Visit: Payer: Self-pay | Admitting: Nurse Practitioner

## 2017-01-16 IMAGING — US US THORACENTESIS ASP PLEURAL SPACE W/IMG GUIDE
1 series · 4 of 4 positions shown · non-contrast
Comparison: CT Chest 08/18/2015.

MEDICATIONS:
None

COMPLICATIONS:
None immediate

INDICATION: Symptomatic right sided pleural effusion

EXAM:
US THORACENTESIS ASP PLEURAL SPACE W/IMG GUIDE
TECHNIQUE: Informed written consent was obtained from the patient after a
discussion of the risks, benefits and alternatives to treatment. A
timeout was performed prior to the initiation of the procedure.

[Series 1: us thoracentesis asp pleural space w/img guide · 0.27mm/px · 4 of 4 slices shown]
[im 1/4]
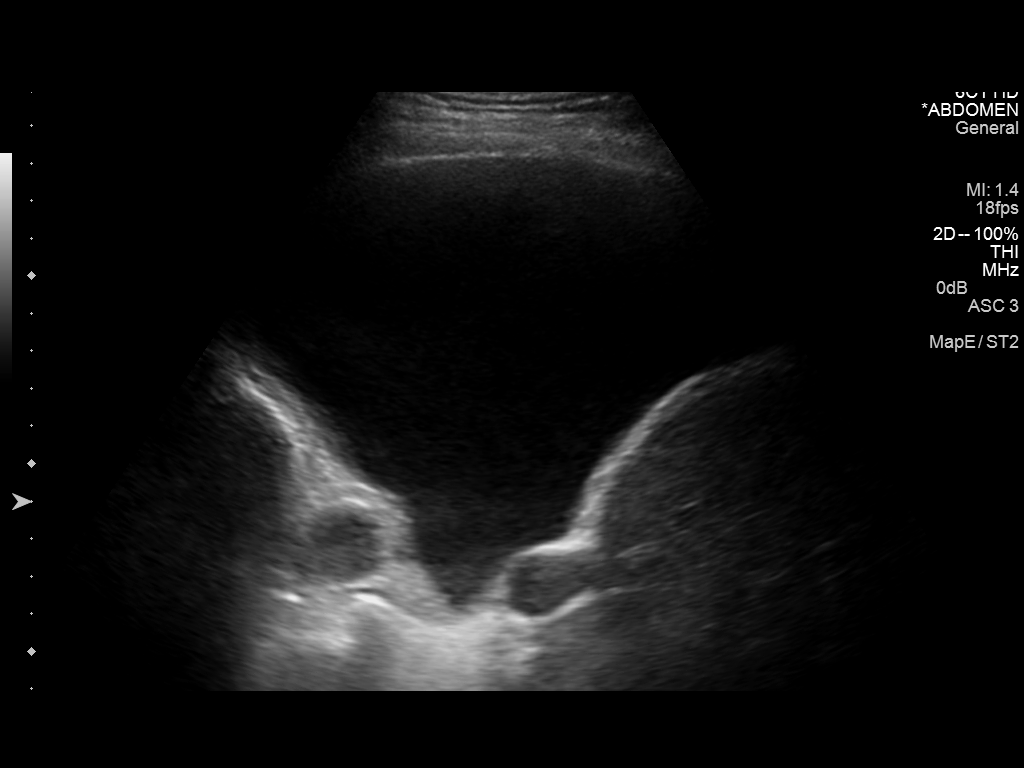
[im 2/4]
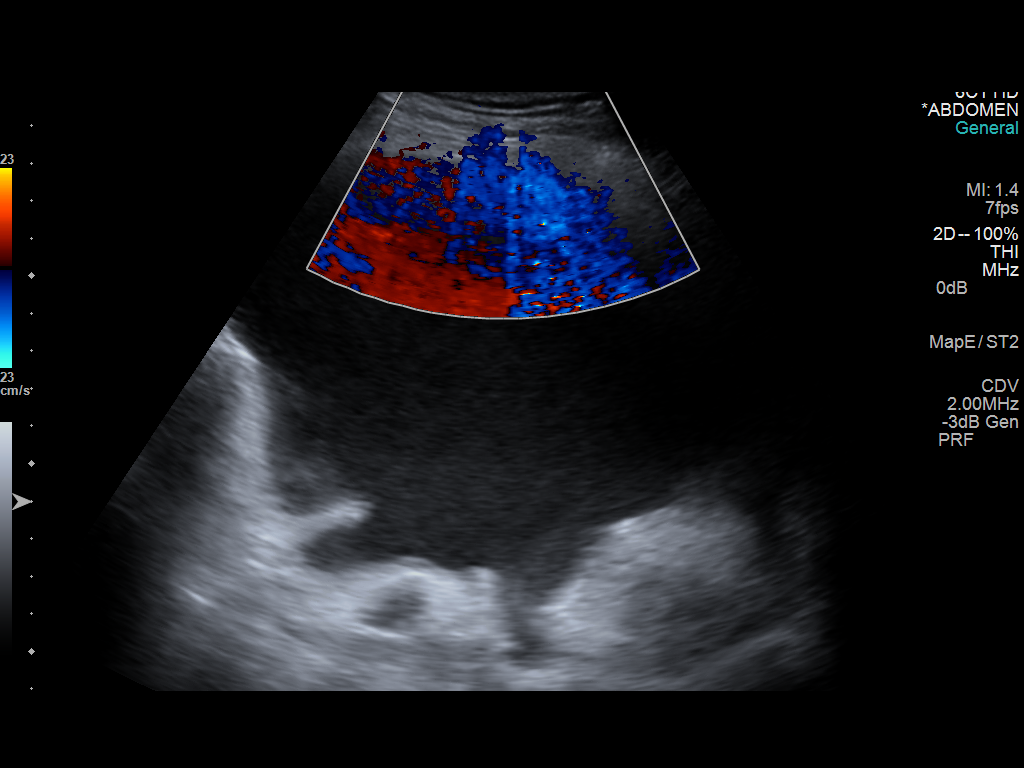
[im 3/4]
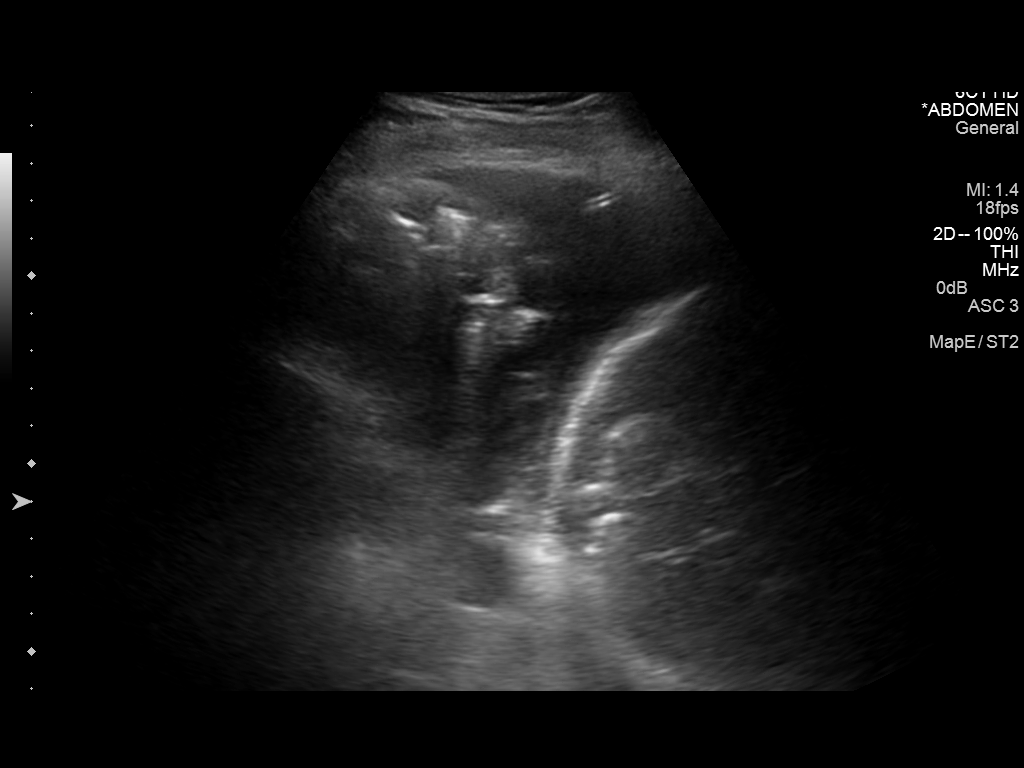
[im 4/4]
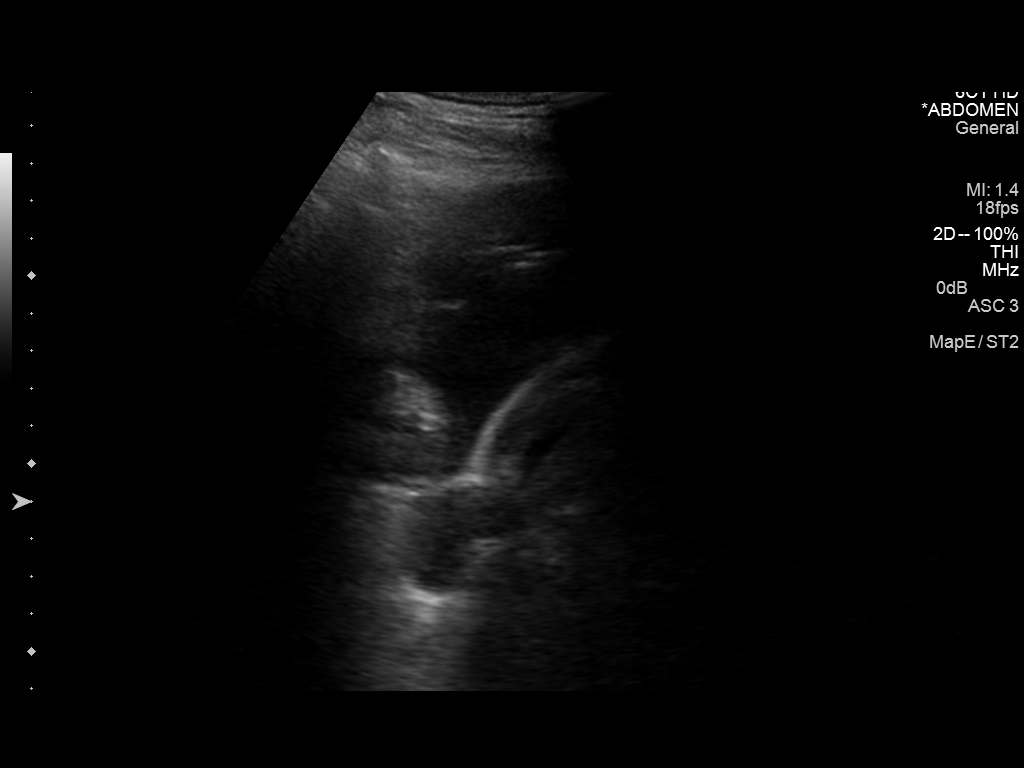

[4 of 4 positions shown; findings below may reference images not displayed]

Initial ultrasound scanning demonstrates a right pleural effusion.
The lower chest was prepped and draped in the usual sterile fashion.
1% lidocaine was used for local anesthesia.

An ultrasound image was saved for documentation purposes. A 6 Fr
Safe-T-Centesis catheter was introduced. The thoracentesis was
performed. The catheter was removed and a dressing was applied. The
patient tolerated the procedure well without immediate post
procedural complication. The patient was escorted to have an upright
chest radiograph.
FINDINGS: A total of approximately 1.4 liters of serous/blood tinged fluid was
removed. Requested samples were sent to the laboratory.
IMPRESSION: Successful ultrasound-guided right sided thoracentesis yielding
liters of pleural fluid.

## 2017-03-01 IMAGING — CR DG CHEST 2V
2 series · 2 of 2 positions shown · non-contrast
Comparison: None.

CLINICAL DATA: Cough, shortness of breath, right lower lobe
crackles, wheezing

EXAM:
CHEST  2 VIEW

[view not recorded (1 of 2)]
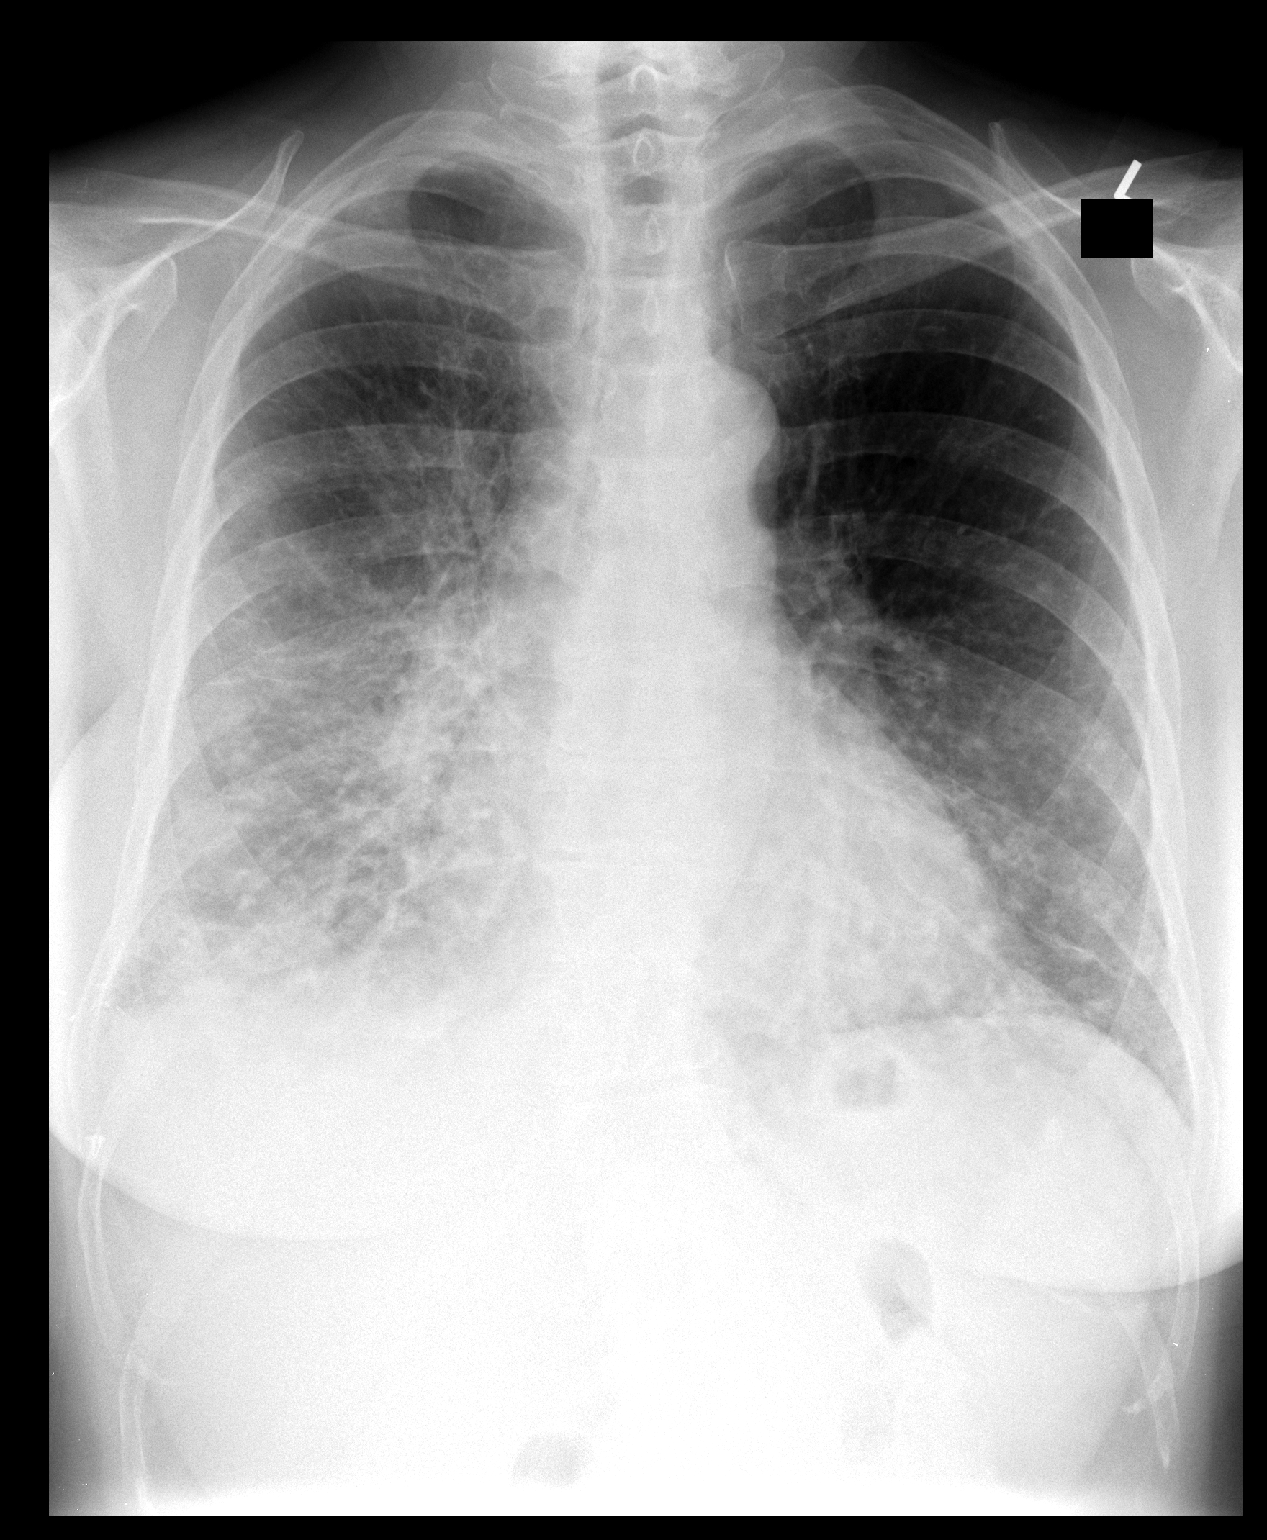

[view not recorded (2 of 2)]
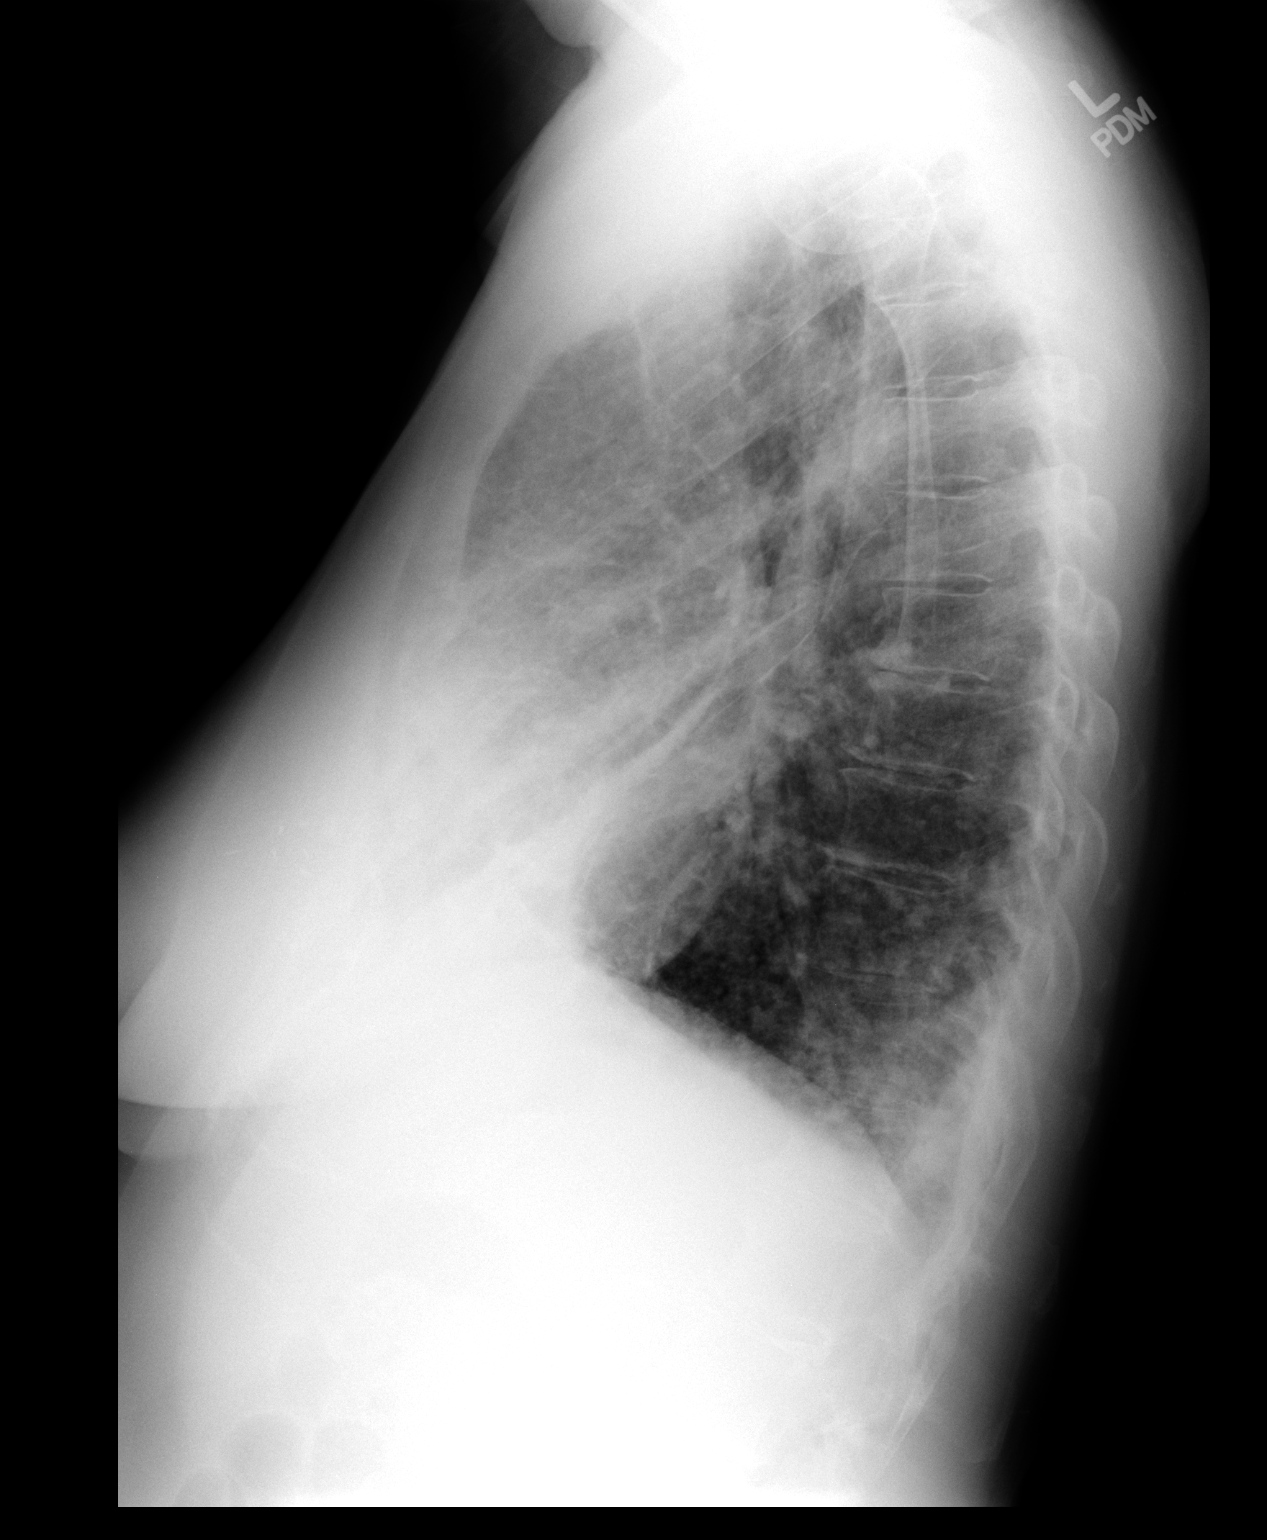

[2 of 2 positions shown; findings below may reference images not displayed]

FINDINGS: Cardiomediastinal silhouette is unremarkable. Hyperinflation is
noted. Reticulonodular interstitial prominence noted bilateral lower
lobe right greater than left. Findings highly suspicious for
pneumonitis or atypical infection. Follow-up to resolution is
recommended. No pulmonary edema. No pleural effusion.
IMPRESSION: Reticulonodular interstitial prominence bilateral lower lobe right
greater than left highly suspicious for pneumonitis or atypical
infection. No pulmonary edema. Follow-up to resolution is
recommended. No pleural effusion.

## 2017-03-27 IMAGING — CR DG CHEST 1V
1 series · 1 of 1 positions shown · non-contrast
Comparison: Chest x-ray of 08/16/2015 and CT chest of 08/18/2015

CLINICAL DATA: Post thoracentesis

EXAM:
CHEST  1 VIEW

[w chest pa *]
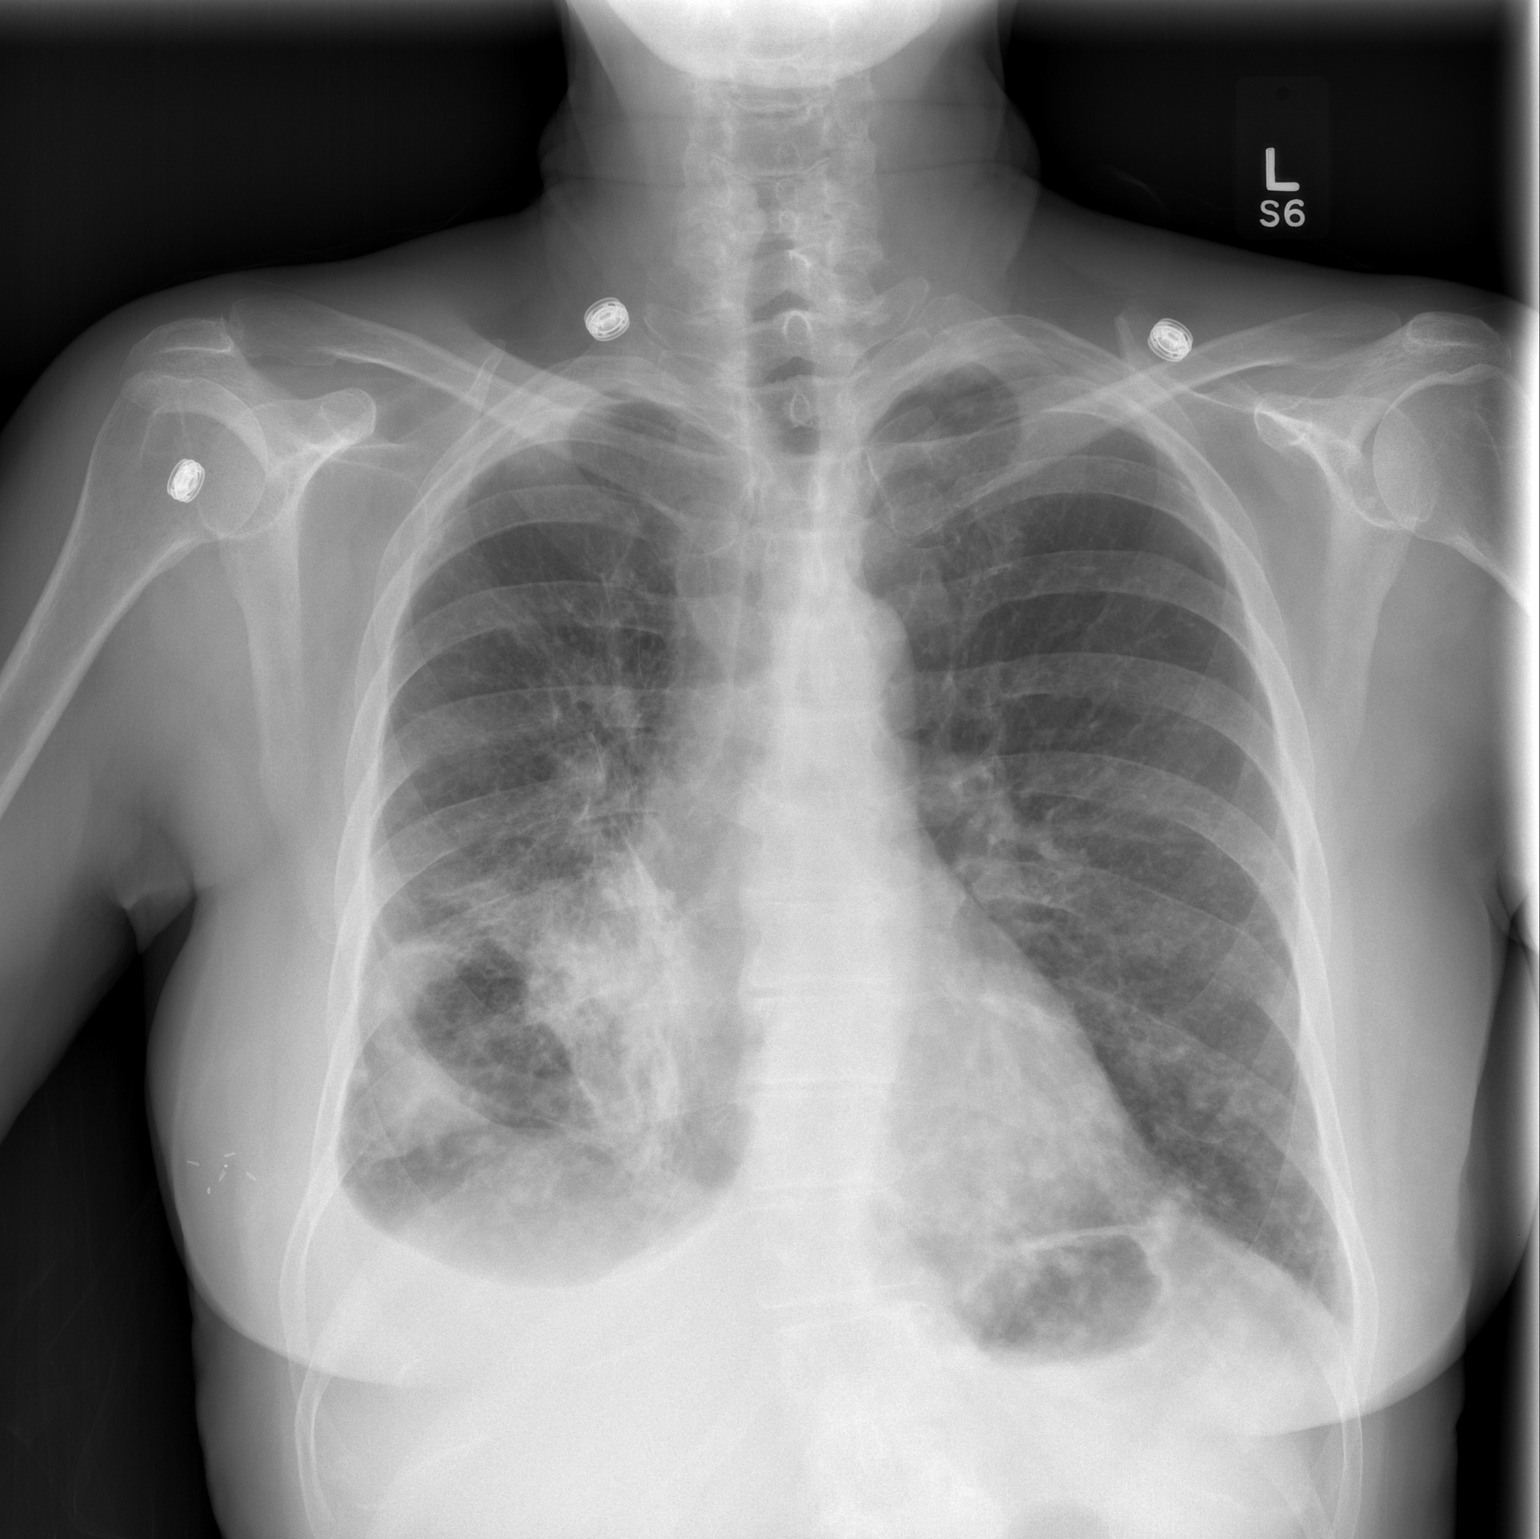

[1 of 1 positions shown; findings below may reference images not displayed]

FINDINGS: Some of the right pleural effusion has been evacuated after right
thoracentesis. No pneumothorax is seen. Abnormal opacity remains at
the right lung base representing infiltrate, atelectasis, or
possibly tumor. The multiple pulmonary nodules noted throughout the
lungs on CT of the chest are not as well appreciated by chest x-ray,
consistent with diffuse pulmonary metastases. Heart size is stable.
IMPRESSION: Some decrease in the volume of the right pleural effusion after
right thoracentesis. No pneumothorax.

## 2017-08-22 IMAGING — CT CT CHEST W/ CM
2 of 5 series · 15 of 46 positions shown, 17 images · IV contrast (OMNIPAQUE)
Comparison: 11/16/2015

CLINICAL DATA: Restaging of right-sided lung cancer diagnosed [DATE]
with chemotherapy finishing 3 weeks ago. Breast cancer 18 years ago
with right lumpectomy and radiation therapy. Restaging.

EXAM:
CT CHEST, ABDOMEN, AND PELVIS WITH CONTRAST
TECHNIQUE: Multidetector CT imaging of the chest, abdomen and pelvis was
performed following the standard protocol during bolus
administration of intravenous contrast.
CONTRAST:  100mL OMNIPAQUE IOHEXOL 300 MG/ML  SOLN

[Series 2: cap with st · axial · 0.73mm/px · z∈[-562,+18]mm · 12 of 132 slices shown, 14 images]
[im 8/132  soft-tissue]
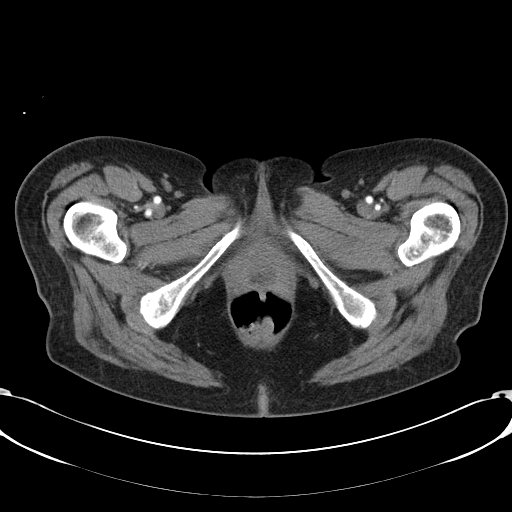
[im 8/132  bone]
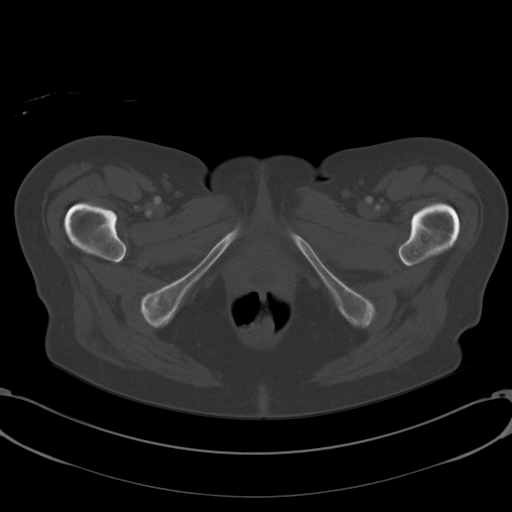
[im 22/132  soft-tissue]
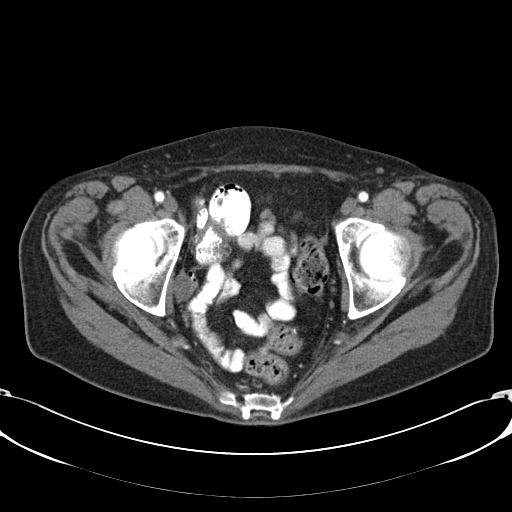
[im 30/132  soft-tissue]
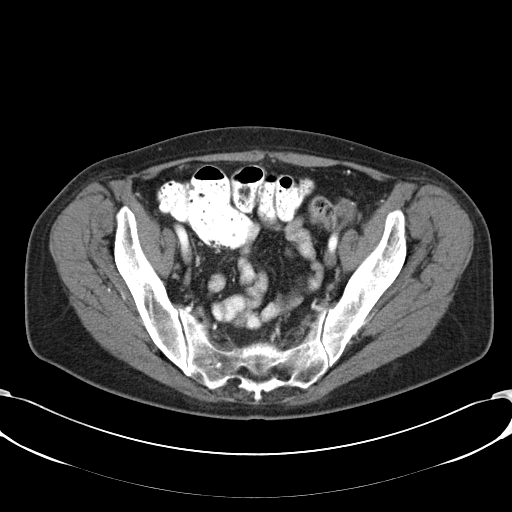
[im 37/132  soft-tissue]
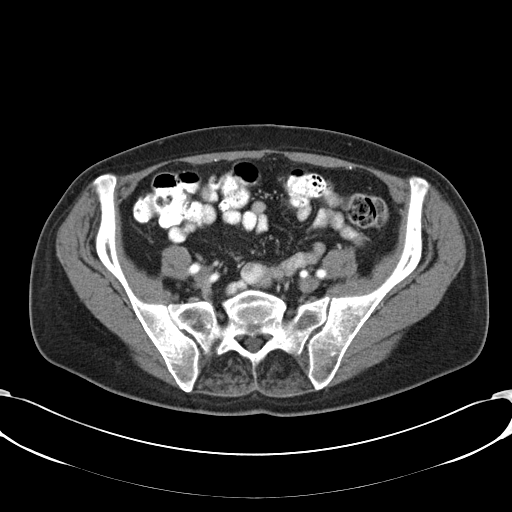
[im 51/132  soft-tissue]
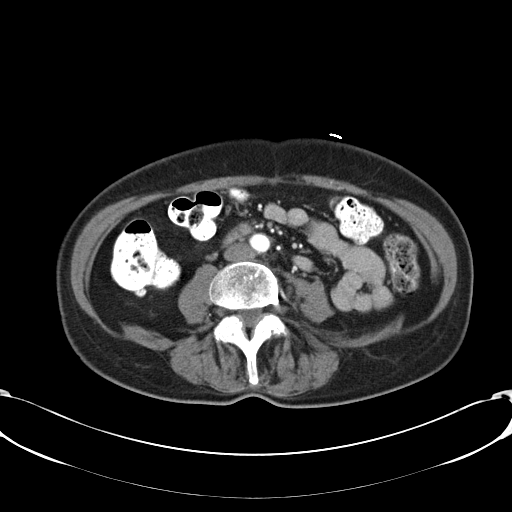
[im 59/132  soft-tissue]
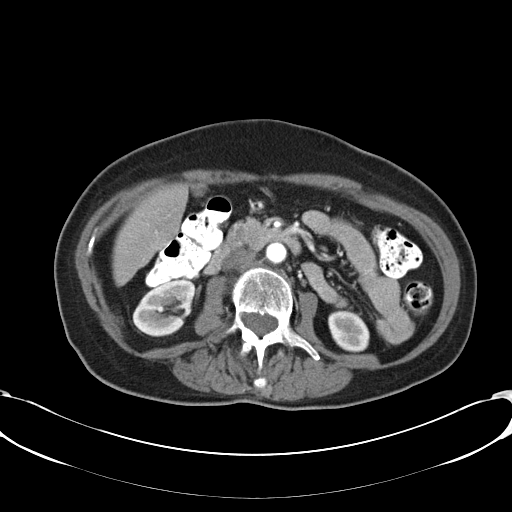
[im 73/132  soft-tissue]
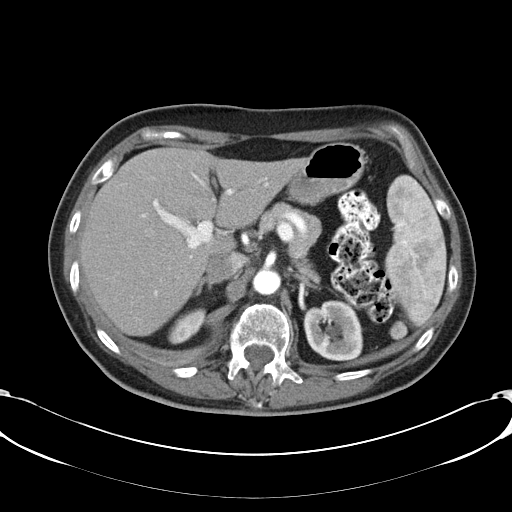
[im 81/132  soft-tissue]
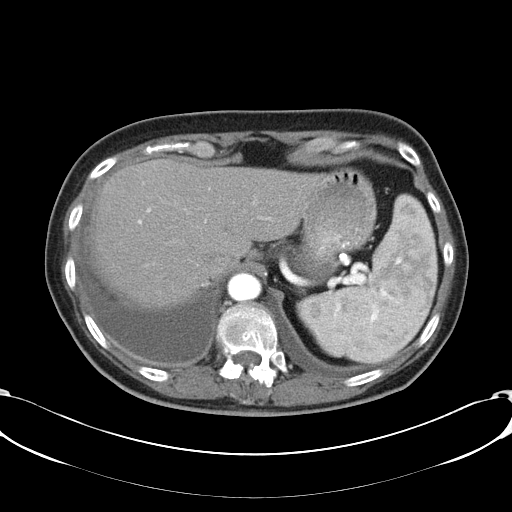
[im 95/132  soft-tissue]
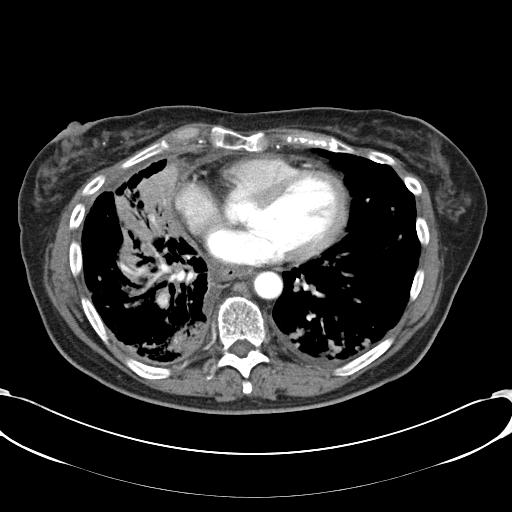
[im 95/132  bone]
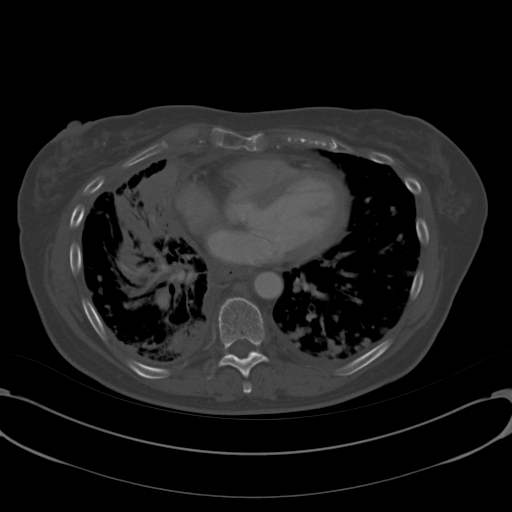
[im 102/132  soft-tissue]
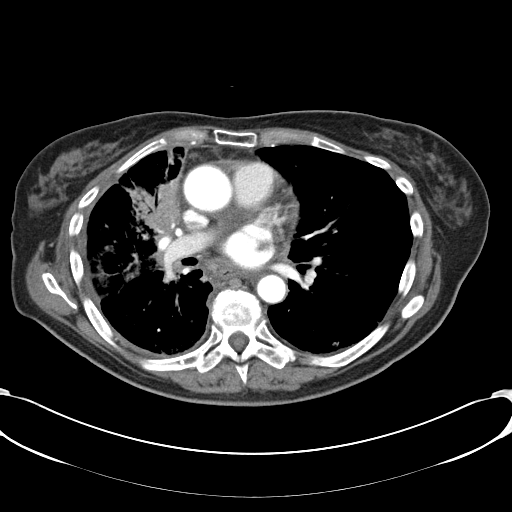
[im 110/132  soft-tissue]
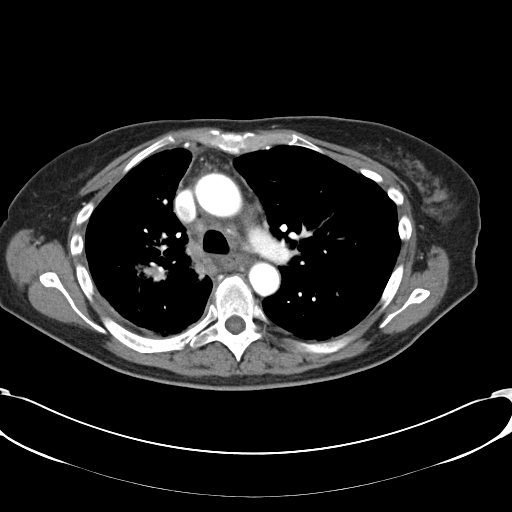
[im 124/132  soft-tissue]
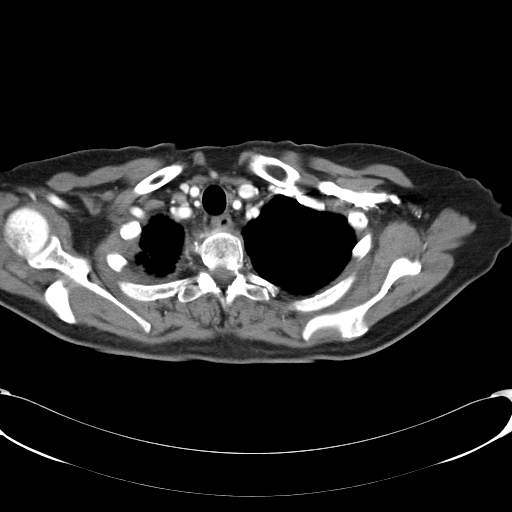

[Series 602: <mpr thick range> · coronal · 1.28mm/px · 3 of 76 slices shown]
[im 26/76  soft-tissue]
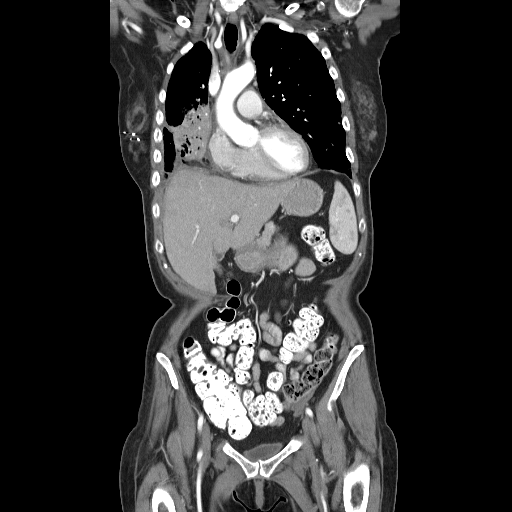
[im 34/76  soft-tissue]
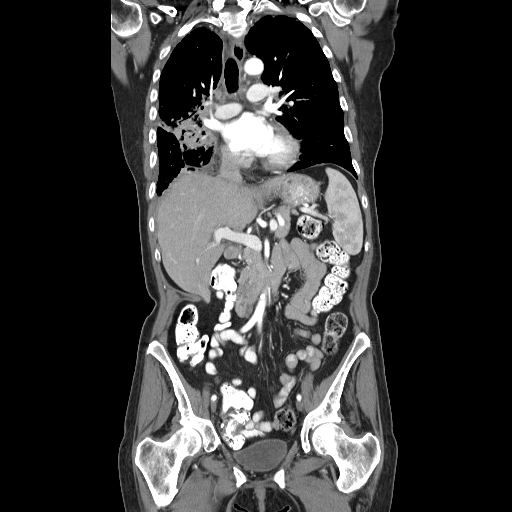
[im 42/76  soft-tissue]
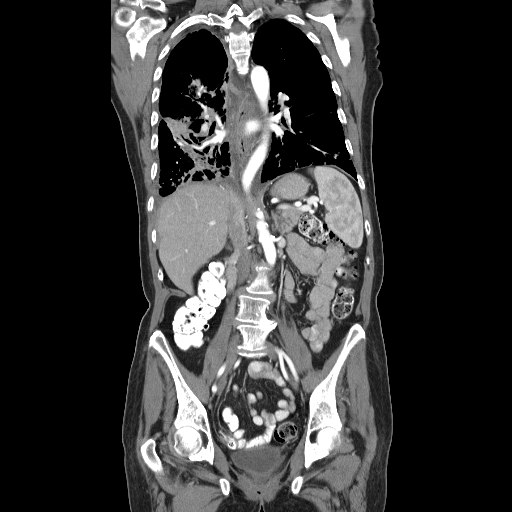

[15 of 46 positions shown; findings below may reference images not displayed]

FINDINGS: CT CHEST

Mediastinum/Nodes: Low right jugular node is similar at 6 mm on
image 8/series 2. Pretracheal node measures 8 mm on image 19/series
2 versus 12 mm on the prior. Right hilar node is similar at 1.0 cm.

Aortic and branch vessel atherosclerosis. Mild cardiomegaly with no
pericardial effusion. No central pulmonary embolism, on this
non-dedicated study.

Lungs/Pleura: Similar small right pleural effusion with right-sided
pleural thickening and hyper attenuation or hyper enhancement.
Parietal pleural thickening measures 7 mm today on image 57/series
2. Similar to 6 mm on the prior. Trace left pleural fluid is new.

Patent endobronchial tree. Redemonstration of areas of
peribronchovascular predominant interstitial thickening and masslike
consolidation throughout the right lung. Similar, but difficult to
measure secondary to ill-defined nature. The index component within
the central right upper lobe measures 2.7 x 1.7 cm on image
23/series 4. Compare 3.2 x 1.6 cm on the prior exam.

Innumerable subpleural predominant pulmonary nodules are again
identified. Some are minimally enlarged. Example medial left lower
lobe at 7 mm on image 40/series 4, 5 mm the same level on the prior.
index left lower lobe nodule on the prior exam measures 11 mm on
image 35 series 4 today versus 10 mm on the prior.

A subpleural superior segment left lower lobe 6 mm nodule on image
20/series 4 measured 4 mm on the prior (when remeasured).

Musculoskeletal: No acute osseous abnormality. Anterior right second
third rib sclerotic foci are unchanged and likely bone islands.

CT ABDOMEN AND PELVIS

Hepatobiliary: Normal liver. Normal gallbladder, without biliary
ductal dilatation.

Pancreas: Normal, without mass or ductal dilatation.

Spleen: Normal in size, without focal abnormality.

Adrenals/Urinary Tract: Normal adrenal glands. Interpolar left renal
lesion is too small to characterize but likely a cyst. Normal right
kidney, without hydronephrosis. Normal urinary bladder.

Stomach/Bowel: Normal stomach, without wall thickening. Scattered
colonic diverticula. Normal terminal ileum and appendix. Normal
small bowel.

Vascular/Lymphatic: Aortic and branch vessel atherosclerosis. No
abdominopelvic adenopathy.

Reproductive: Hysterectomy.  No adnexal mass.

Other: No significant free fluid. No evidence of omental or
peritoneal disease.

Musculoskeletal: Tiny pelvic sclerotic lesions which are unchanged
and favored to represent bone islands. Convex left lumbar spine
curvature.
IMPRESSION: CT CHEST IMPRESSION

1. Similar appearance of right-sided peribronchovascular tumor with
areas of septal thickening and more masslike consolidation.
2. Innumerable pulmonary nodules, slightly progressive.
3. Similar to decreased thoracic adenopathy.
4. Similar small right pleural effusion with pleural thickening,
suggesting malignancy. A tiny left pleural effusion is new.

CT ABDOMEN AND PELVIS IMPRESSION

1. No acute process or evidence of metastatic disease in the abdomen
or pelvis.
2. Hysterectomy.
